# Patient Record
Sex: Female | Born: 1990 | Race: Black or African American | Hispanic: No | Marital: Single | Smoking: Never smoker
Health system: Southern US, Community
[De-identification: ages and names within clinical notes are randomized; demographics above are authoritative.]

## PROBLEM LIST (undated history)

## (undated) ENCOUNTER — Inpatient Hospital Stay (HOSPITAL_COMMUNITY): Payer: Self-pay

## (undated) DIAGNOSIS — I5020 Unspecified systolic (congestive) heart failure: Secondary | ICD-10-CM

## (undated) DIAGNOSIS — I10 Essential (primary) hypertension: Secondary | ICD-10-CM

## (undated) HISTORY — DX: Unspecified systolic (congestive) heart failure: I50.20

---
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
## 2011-02-20 LAB — CBC
MCHC: 34.9
MCV: 92.5
Platelets: 243
Platelets: 276
RDW: 13.8
WBC: 12.8 — ABNORMAL HIGH
WBC: 18.6 — ABNORMAL HIGH

## 2011-02-20 LAB — RPR: RPR Ser Ql: NONREACTIVE

## 2013-05-22 DIAGNOSIS — I10 Essential (primary) hypertension: Secondary | ICD-10-CM

## 2013-05-22 HISTORY — DX: Essential (primary) hypertension: I10

## 2013-08-29 DIAGNOSIS — Z87410 Personal history of cervical dysplasia: Secondary | ICD-10-CM | POA: Insufficient documentation

## 2015-02-20 HISTORY — PX: CHOLECYSTECTOMY: SHX55

## 2016-02-11 ENCOUNTER — Ambulatory Visit (HOSPITAL_COMMUNITY)
Admission: EM | Admit: 2016-02-11 | Discharge: 2016-02-11 | Disposition: A | Discharge status: Home / Self Care | Payer: Self-pay | Attending: Internal Medicine | Admitting: Internal Medicine

## 2016-02-11 ENCOUNTER — Encounter (HOSPITAL_COMMUNITY): Payer: Self-pay | Admitting: *Deleted

## 2016-02-11 DIAGNOSIS — K029 Dental caries, unspecified: Secondary | ICD-10-CM

## 2016-02-11 HISTORY — DX: Essential (primary) hypertension: I10

## 2016-02-11 MED ORDER — METHYLPREDNISOLONE ACETATE 80 MG/ML IJ SUSP: 80.0000 mg | Freq: Once | INTRAMUSCULAR | Status: DC

## 2016-02-11 MED ORDER — AMOXICILLIN 500 MG PO CAPS: 500.0000 mg | ORAL_CAPSULE | Freq: Three times a day (TID) | ORAL | 0 refills | Status: DC

## 2016-02-11 MED ORDER — DICLOFENAC SODIUM 75 MG PO TBEC: DELAYED_RELEASE_TABLET | ORAL | 1 refills | Status: DC

## 2016-02-11 NOTE — ED Triage Notes (Signed)
Pt  Reports  Toothache     She  Reports  It is  An  Ongoing  Problem        And    She  Treatment     Pt  Reports       Symptoms  Not  releived  By otc meds

## 2016-02-11 NOTE — ED Provider Notes (Signed)
CSN: [id number]     Arrival date & time 02/11/16  1809 History   First MD Initiated Contact with Patient 02/11/16 1919     No chief complaint on file.  (Consider location/radiation/quality/duration/timing/severity/associated sxs/prior Treatment) 25 yo female presents with pain and "broken tooth" to right lower gum. Known need for dentist but does not have dental benefits yet. She will have soon. No fever or chills. Swelling and pain along the gum line.       No past medical history on file. No past surgical history on file. No family history on file. Social History  Substance Use Topics  . Smoking status: Not on file  . Smokeless tobacco: Not on file  . Alcohol use Not on file   OB History    No data available     Review of Systems  Constitutional: Negative for chills and fever.  HENT: Positive for dental problem.     Allergies  Review of patient's allergies indicates not on file.  Home Medications   Prior to Admission medications   Medication Sig Start Date End Date Taking? Authorizing Provider  amoxicillin (AMOXIL) 500 MG capsule Take 1 capsule (500 mg total) by mouth 3 (three) times daily. 02/11/16   Riki SheerMichelle G Sumiye Hirth, PA-C  diclofenac (VOLTAREN) 75 MG EC tablet 1 every 12 hours as needed for pain 02/11/16   Riki SheerMichelle G Eshaal Duby, PA-C   Meds Ordered and Administered this Visit  Medications - No data to display  BP 158/92 (BP Location: Left Arm)   Pulse 70   Temp 98.9 F (37.2 C) (Oral)   Resp 12   SpO2 100%  No data found.   Physical Exam  Constitutional: She appears well-developed and well-nourished. No distress.  HENT:  Mouth/Throat: Oropharynx is clear and moist.  Right lower gum with broken tooth, erythematous gum, pain with palpation, no visible frank abscess or drainage  Skin: Skin is warm and dry. She is not diaphoretic.  Psychiatric: Her behavior is normal.  Nursing note and vitals reviewed.   Urgent Care Course   Clinical Course    Procedures  (including critical care time)  Labs Review Labs Reviewed - No data to display  Imaging Review No results found.   Visual Acuity Review  Right Eye Distance:   Left Eye Distance:   Bilateral Distance:    Right Eye Near:   Left Eye Near:    Bilateral Near:         MDM   1. Pain due to dental caries    No frank abscess. Treat with Amoxicillin and NSAID. Try to f/u with Dentist. If worsens or has signs of systemic infection f/u in the ER.     Riki SheerMichelle G Nikeshia Keetch, PA-C 02/11/16 1932

## 2016-02-11 NOTE — Discharge Instructions (Signed)
Take all of the antibiotics. Use the Diclofenac every 12 hours for pain, may supplement with Tylenol ES 2 every 4 hours as well. Feel better. Get to a dentist when can. If worsens or start having high fevers or swelling, go to ED.

## 2016-02-25 ENCOUNTER — Ambulatory Visit (INDEPENDENT_AMBULATORY_CARE_PROVIDER_SITE_OTHER): Payer: Self-pay | Admitting: Internal Medicine

## 2016-02-25 ENCOUNTER — Encounter: Payer: Self-pay | Admitting: Internal Medicine

## 2016-02-25 VITALS — BP 160/104 | Ht 67.0 in | Wt 207.0 lb

## 2016-02-25 DIAGNOSIS — K029 Dental caries, unspecified: Secondary | ICD-10-CM

## 2016-02-25 DIAGNOSIS — I10 Essential (primary) hypertension: Secondary | ICD-10-CM | POA: Insufficient documentation

## 2016-02-25 MED ORDER — AMLODIPINE BESYLATE 5 MG PO TABS: 5.0000 mg | ORAL_TABLET | Freq: Every day | ORAL | 11 refills | Status: DC

## 2016-02-25 NOTE — Progress Notes (Signed)
   Subjective:    Patient ID: Ann Robbins, female    DOB: 10/30/1990, 25 y.o.   MRN: [id number]  HPI   Here to establish  1.  Essential Hypertension:  Diagnosed with PIH at age 25 yo.  After delivered, BP stayed high.  Was treated with Amlodipine 5 mg.    She is on Micronor BCPs and takes regularly, though having some break through bleeding since on antibiotics last month.  Discussed using condoms for the pill pack when on antibiotics in the future. No plans for anymore children.  Current Meds  Medication Sig  . norethindrone (MICRONOR,CAMILA,ERRIN) 0.35 MG tablet Take 1 tablet by mouth daily.   No Known Allergies   Past Medical History:  Diagnosis Date  . Hypertension 2015   First with PIH and bp remained high after delivery   Past Surgical History:  Procedure Laterality Date  . CHOLECYSTECTOMY  02/2015   Laparoscopic   Family History  Problem Relation Age of Onset  . Hypertension Mother    Social History   Social History  . Marital status: Single    Spouse name: Nicanor AlconHugh McAdoo  . Number of children: 2  . Years of education: college-some   Occupational History  . [employer]    Social History Main Topics  . Smoking status: Never Smoker  . Smokeless tobacco: Never Used  . Alcohol use No  . Drug use: No  . Sexual activity: Yes    Birth control/ protection: Pill   Other Topics Concern  . Not on file   Social History Narrative   [place]   [place] in March of 2017   Lives at home with boyfriend, Jena GaussHugh and their 2 children        Review of Systems     Objective:   Physical Exam  NAD HEENT: PERRL, EOMI, discs sharp, no AV nicking, TMs pearly gray, throat without injection.  caried teeth. Neck: Supple, no adenopathy, no thyromegaly Chest:  CTA CV:  RRR with normal S1 and S2, No S3, S4 or murmur.  Radial and DP pulses normal and equal Abd:  S, NT, No HSM or mass, + BS LE:  No edema        Assessment &  Plan:  1.  Essential Hypertension:  Amlodipine 5 mg daily.  Follow up next Wednesday for BP check so I can fill out form for work that her BP is controlled.  BMP then as well. [facility], but arrange for delivery thereafter.  2.  Dental decay:  Dental referral.  Will need orange card to send in.  Follow up in 2-3 months with me

## 2016-02-26 DIAGNOSIS — K029 Dental caries, unspecified: Secondary | ICD-10-CM | POA: Insufficient documentation

## 2016-03-01 ENCOUNTER — Ambulatory Visit (INDEPENDENT_AMBULATORY_CARE_PROVIDER_SITE_OTHER): Payer: Self-pay | Admitting: Internal Medicine

## 2016-03-01 VITALS — BP 150/108 | HR 99

## 2016-03-01 DIAGNOSIS — I10 Essential (primary) hypertension: Secondary | ICD-10-CM

## 2016-03-01 MED ORDER — AMLODIPINE BESYLATE 10 MG PO TABS: ORAL_TABLET | ORAL | 11 refills | Status: DC

## 2016-03-01 NOTE — Progress Notes (Signed)
Here for nurse BP check.   Measurements by Richrd SoxEstefania Alfaro Ruiz  Increase Amlodipine to 10 mg daily Repeat BP check in 1 week.

## 2016-03-09 ENCOUNTER — Ambulatory Visit: Payer: Self-pay

## 2016-03-09 VITALS — BP 146/94

## 2016-03-09 DIAGNOSIS — I10 Essential (primary) hypertension: Secondary | ICD-10-CM

## 2016-03-13 NOTE — Progress Notes (Signed)
Patient in for BP CHECK

## 2016-03-27 ENCOUNTER — Ambulatory Visit: Payer: Self-pay

## 2016-03-27 VITALS — BP 130/90 | HR 96

## 2016-03-27 DIAGNOSIS — I10 Essential (primary) hypertension: Secondary | ICD-10-CM

## 2016-03-28 ENCOUNTER — Encounter: Payer: Self-pay | Admitting: Internal Medicine

## 2016-03-28 ENCOUNTER — Ambulatory Visit (INDEPENDENT_AMBULATORY_CARE_PROVIDER_SITE_OTHER): Payer: Self-pay | Admitting: Internal Medicine

## 2016-03-28 VITALS — BP 134/84 | HR 98 | Resp 18 | Ht 67.0 in | Wt 210.0 lb

## 2016-03-28 DIAGNOSIS — I10 Essential (primary) hypertension: Secondary | ICD-10-CM

## 2016-03-28 NOTE — Progress Notes (Signed)
   Subjective:    Patient ID: Ann Robbins, female    DOB: 03/19/1991, 25 y.o.   MRN: [id number]  HPI   Here as needs form for work signed off regarding having a normal bp.  Yesterday, her bp was above goal of 140/90.  Today, she meets the goal. No problems with Amlodipine. Discussed again that would recommend a switch of bp meds if she decides to get pregnant   Would need to continue on some sort of reliable birth control.  Current Meds  Medication Sig  . amLODipine (NORVASC) 10 MG tablet 1 tab by mouth once daily  . norethindrone (MICRONOR,CAMILA,ERRIN) 0.35 MG tablet Take 1 tablet by mouth daily.    No Known Allergies    Review of Systems     Objective:   Physical Exam NAD Lungs:  CTA CV:  RRR without murmur or rub, radial pulses normal and equal       Assessment & Plan:  Essential Hypertension:  At goal below 140/90, but discussed regular physical activity to get a bit lower. Keep appt. In January

## 2016-05-22 NOTE — L&D Delivery Note (Signed)
Patient is a 26 y.o. now [id number] s/p NSVD at 7054w1d, who [facility]. S/p IOL with misoprostol and Oxytocin. AROM with clear fluid at 16:45 on 12/30.  Delivery Note At 12:35 AM a viable female was delivered via Vaginal, Spontaneous (Presentation: vertex; LOA ).  APGAR: 9, 9; weight pending. Placenta status: intact, .  Cord: 3-vessel Anesthesia:   Episiotomy: None Lacerations: Periurethral Suture Repair: none Est. Blood Loss (mL): 150  Head delivered LOA. Nuchal cord x1 present, loose and easily reduced, then shoulders and body delivered in usual fashion. Infant with spontaneous cry, placed on mother's abdomen, dried and bulb suctioned. Cord clamped x 2 after 1-minute delay, and cut by family member. Cord blood drawn. Placenta delivered spontaneously with gentle cord traction. Fundus firm with massage and Pitocin. Perineum inspected and found to have no lacerations other than superficial left periurethral abrasion.   Mom to postpartum.  Baby to Couplet care / Skin to Skin.  Raynelle FanningJulie P. Marin Milley, MD OB Fellow 05/21/17, 1:06 AM

## 2016-05-26 ENCOUNTER — Ambulatory Visit: Payer: Self-pay | Admitting: Internal Medicine

## 2016-07-07 ENCOUNTER — Ambulatory Visit (INDEPENDENT_AMBULATORY_CARE_PROVIDER_SITE_OTHER): Payer: Self-pay | Admitting: Internal Medicine

## 2016-07-07 ENCOUNTER — Ambulatory Visit: Payer: Self-pay | Admitting: Internal Medicine

## 2016-07-07 ENCOUNTER — Encounter: Payer: Self-pay | Admitting: Internal Medicine

## 2016-07-07 VITALS — BP 144/94 | HR 78 | Resp 12 | Ht 66.5 in | Wt 218.0 lb

## 2016-07-07 DIAGNOSIS — E6609 Other obesity due to excess calories: Secondary | ICD-10-CM

## 2016-07-07 DIAGNOSIS — I10 Essential (primary) hypertension: Secondary | ICD-10-CM

## 2016-07-07 DIAGNOSIS — Z6834 Body mass index (BMI) 34.0-34.9, adult: Secondary | ICD-10-CM

## 2016-07-07 NOTE — Progress Notes (Signed)
   Subjective:    Patient ID: Ann Robbins, female    DOB: 06/12/1990, 26 y.o.   MRN: [id number]  HPI   Essential Hypertension:  States has not missed Amlodipine.  Feels she had a bad day at work and that's why her bp may be high.  Has gained 8 lbs since last visit.   Describes an unhealthy diet. Not really physically active Discussed how weight and sedentary lifestyle, poor diet can maintain hypertension.  Current Meds  Medication Sig  . amLODipine (NORVASC) 10 MG tablet 1 tab by mouth once daily   No Known Allergies    Review of Systems     Objective:   Physical Exam Obese, NAD Lungs:  CTA CV:  RRR with normal S1 and S2, No S3, S4 or murmur, radial pulses normal and equal. LE:  No edema        Assessment & Plan:  1.  Essential Hypertension:  Not at goal.  Encouraged working on gradually improving lifestyle changes with diet and physical activity to improve BP.  If unable to make a change soon, will likely need to add medication.   BP check and weight check in 1 month and monthly.  To see me in 3 months

## 2016-07-07 NOTE — Patient Instructions (Signed)
Drink a glass of water before every meal Drink 6-8 glasses of water daily Eat three meals daily Eat a protein and healthy fat with every meal (eggs,fish, chicken, turkey and limit red meats) Eat 5 servings of vegetables daily, mix the colors Eat 2 servings of fruit daily with skin, if skin is edible Use smaller plates Put food/utensils down as you chew and swallow each bite Eat at a table with friends/family at least once daily, no TV Do not eat in front of the TV 

## 2016-07-09 ENCOUNTER — Encounter: Payer: Self-pay | Admitting: Internal Medicine

## 2016-07-09 DIAGNOSIS — E669 Obesity, unspecified: Secondary | ICD-10-CM | POA: Insufficient documentation

## 2016-08-04 ENCOUNTER — Other Ambulatory Visit: Payer: Self-pay | Admitting: Internal Medicine

## 2016-10-03 ENCOUNTER — Ambulatory Visit: Payer: Self-pay | Admitting: Internal Medicine

## 2016-11-09 LAB — SICKLE CELL SCREEN: SICKLE CELL SCREEN: NORMAL

## 2016-11-09 LAB — OB RESULTS CONSOLE RUBELLA ANTIBODY, IGM: RUBELLA: IMMUNE

## 2016-11-09 LAB — OB RESULTS CONSOLE HIV ANTIBODY (ROUTINE TESTING): HIV: NONREACTIVE

## 2016-11-09 LAB — OB RESULTS CONSOLE HEPATITIS B SURFACE ANTIGEN: HEP B S AG: NEGATIVE

## 2016-11-10 ENCOUNTER — Ambulatory Visit: Payer: Self-pay | Admitting: Internal Medicine

## 2016-11-13 ENCOUNTER — Other Ambulatory Visit (HOSPITAL_COMMUNITY): Payer: Self-pay | Admitting: Nurse Practitioner

## 2016-11-13 DIAGNOSIS — Z3682 Encounter for antenatal screening for nuchal translucency: Secondary | ICD-10-CM

## 2016-11-13 LAB — OB RESULTS CONSOLE GC/CHLAMYDIA
CHLAMYDIA, DNA PROBE: NEGATIVE
GC PROBE AMP, GENITAL: NEGATIVE

## 2016-11-13 LAB — OB RESULTS CONSOLE ANTIBODY SCREEN: Antibody Screen: NEGATIVE

## 2016-11-13 LAB — OB RESULTS CONSOLE HGB/HCT, BLOOD
HCT: 33
Hemoglobin: 11

## 2016-11-13 LAB — OB RESULTS CONSOLE PLATELET COUNT: Platelets: 299

## 2016-11-13 LAB — OB RESULTS CONSOLE ABO/RH: RH TYPE: POSITIVE

## 2016-11-13 LAB — OB RESULTS CONSOLE RPR: RPR: NONREACTIVE

## 2016-11-13 LAB — OB RESULTS CONSOLE VARICELLA ZOSTER ANTIBODY, IGG: Varicella: IMMUNE

## 2016-11-23 ENCOUNTER — Encounter (HOSPITAL_COMMUNITY): Payer: Self-pay | Admitting: *Deleted

## 2016-11-24 ENCOUNTER — Ambulatory Visit (HOSPITAL_COMMUNITY)
Admission: RE | Admit: 2016-11-24 | Discharge: 2016-11-24 | Disposition: A | Discharge status: Home / Self Care | Payer: Medicaid Other | Source: Ambulatory Visit | Attending: Nurse Practitioner | Admitting: Nurse Practitioner

## 2016-11-24 ENCOUNTER — Encounter (HOSPITAL_COMMUNITY): Payer: Self-pay

## 2016-11-24 DIAGNOSIS — Z3682 Encounter for antenatal screening for nuchal translucency: Secondary | ICD-10-CM | POA: Diagnosis not present

## 2016-11-24 DIAGNOSIS — Z3A13 13 weeks gestation of pregnancy: Secondary | ICD-10-CM | POA: Insufficient documentation

## 2016-11-24 DIAGNOSIS — O99211 Obesity complicating pregnancy, first trimester: Secondary | ICD-10-CM | POA: Diagnosis not present

## 2016-11-24 DIAGNOSIS — O10019 Pre-existing essential hypertension complicating pregnancy, unspecified trimester: Secondary | ICD-10-CM | POA: Insufficient documentation

## 2016-11-30 ENCOUNTER — Encounter: Payer: Self-pay | Admitting: *Deleted

## 2016-12-04 ENCOUNTER — Encounter: Payer: Self-pay | Admitting: General Practice

## 2016-12-04 ENCOUNTER — Encounter: Payer: Self-pay | Admitting: Obstetrics and Gynecology

## 2016-12-04 ENCOUNTER — Ambulatory Visit (INDEPENDENT_AMBULATORY_CARE_PROVIDER_SITE_OTHER): Payer: Medicaid Other | Admitting: Obstetrics and Gynecology

## 2016-12-04 VITALS — BP 128/81 | HR 98 | Wt 214.5 lb

## 2016-12-04 DIAGNOSIS — O0992 Supervision of high risk pregnancy, unspecified, second trimester: Secondary | ICD-10-CM

## 2016-12-04 DIAGNOSIS — O10912 Unspecified pre-existing hypertension complicating pregnancy, second trimester: Secondary | ICD-10-CM

## 2016-12-04 DIAGNOSIS — O0990 Supervision of high risk pregnancy, unspecified, unspecified trimester: Secondary | ICD-10-CM | POA: Insufficient documentation

## 2016-12-04 DIAGNOSIS — Z3009 Encounter for other general counseling and advice on contraception: Secondary | ICD-10-CM

## 2016-12-04 DIAGNOSIS — O10919 Unspecified pre-existing hypertension complicating pregnancy, unspecified trimester: Secondary | ICD-10-CM | POA: Insufficient documentation

## 2016-12-04 LAB — POCT URINALYSIS DIP (DEVICE)
Bilirubin Urine: NEGATIVE
Glucose, UA: NEGATIVE mg/dL
HGB URINE DIPSTICK: NEGATIVE
Ketones, ur: NEGATIVE mg/dL
LEUKOCYTES UA: NEGATIVE
NITRITE: NEGATIVE
Protein, ur: NEGATIVE mg/dL
SPECIFIC GRAVITY, URINE: 1.01 (ref 1.005–1.030)
Urobilinogen, UA: 0.2 mg/dL (ref 0.0–1.0)
pH: 5.5 (ref 5.0–8.0)

## 2016-12-04 MED ORDER — ASPIRIN EC 81 MG PO TBEC: 81.0000 mg | DELAYED_RELEASE_TABLET | Freq: Every day | ORAL | 3 refills | Status: DC

## 2016-12-04 NOTE — Patient Instructions (Signed)

## 2016-12-04 NOTE — Progress Notes (Signed)
Subjective:  Ann Robbins is a 26 y.o. G3P2002 at 6527w1d being seen today for ongoing prenatal care. [facility].  She is currently monitored for the following issues for this high-risk pregnancy and has Essential hypertension; Dental decay; Obesity; Supervision of high risk pregnancy, antepartum; Unwanted fertility; and Chronic hypertension during pregnancy, antepartum on her problem list.  Patient reports no complaints.  Contractions: Not present. Vag. Bleeding: None.  Movement: Absent. Denies leaking of fluid.   The following portions of the patient's history were reviewed and updated as appropriate: allergies, current medications, past family history, past medical history, past social history, past surgical history and problem list. Problem list updated.  Objective:   Vitals:   12/04/16 1034  BP: 128/81  Pulse: 98  Weight: 214 lb 8 oz (97.3 kg)    Fetal Status: Fetal Heart Rate (bpm): 163   Movement: Absent     General:  Alert, oriented and cooperative. Patient is in no acute distress.  Skin: Skin is warm and dry. No rash noted.   Cardiovascular: Normal heart rate noted  Respiratory: Normal respiratory effort, no problems with respiration noted  Abdomen: Soft, gravid, appropriate for gestational age. Pain/Pressure: Absent     Pelvic:  Cervical exam deferred        Extremities: Normal range of motion.  Edema: None  Mental Status: Normal mood and affect. Normal behavior. Normal judgment and thought content.   Urinalysis:      Assessment and Plan:  Pregnancy: G3P2002 at 3227w1d  1. Chronic hypertension during pregnancy, antepartum BP stable Continue with Labetalol. CHTN and pregnancy reviewed with pt.  CMP and P/Cr completed - aspirin EC 81 MG tablet; Take 1 tablet (81 mg total) by mouth daily. Take after 12 weeks for prevention of preeclampsia later in pregnancy  Dispense: 90 tablet; Refill: 3 - US MFM OB DETAIL +14 WK; Future  2. Supervision of high risk  pregnancy, antepartum  - US MFM OB DETAIL +14 WK; Future  3. Unwanted fertility Will need to sign tubal papers at 28 week visit  Preterm labor symptoms and general obstetric precautions including but not limited to vaginal bleeding, contractions, leaking of fluid and fetal movement were reviewed in detail with the patient. Please refer to After Visit Summary for other counseling recommendations.  Return in about 4 weeks (around 01/01/2017) for OB visit.   Hermina StaggersErvin, Michael L, MD

## 2016-12-05 ENCOUNTER — Other Ambulatory Visit: Payer: Self-pay

## 2016-12-06 LAB — PAP SMEAR, 1 SLIDE: Pap: NEGATIVE

## 2016-12-06 LAB — CULTURE, OB URINE: Urine Culture, OB: NO GROWTH

## 2016-12-25 ENCOUNTER — Ambulatory Visit (HOSPITAL_COMMUNITY)
Admission: RE | Admit: 2016-12-25 | Discharge: 2016-12-25 | Disposition: A | Discharge status: Home / Self Care | Payer: Medicaid Other | Source: Ambulatory Visit | Attending: Obstetrics and Gynecology | Admitting: Obstetrics and Gynecology

## 2016-12-25 ENCOUNTER — Encounter (HOSPITAL_COMMUNITY): Payer: Self-pay

## 2016-12-25 DIAGNOSIS — O10019 Pre-existing essential hypertension complicating pregnancy, unspecified trimester: Secondary | ICD-10-CM | POA: Diagnosis not present

## 2016-12-25 DIAGNOSIS — O99211 Obesity complicating pregnancy, first trimester: Secondary | ICD-10-CM | POA: Diagnosis not present

## 2016-12-25 DIAGNOSIS — Z3A18 18 weeks gestation of pregnancy: Secondary | ICD-10-CM | POA: Diagnosis not present

## 2016-12-25 DIAGNOSIS — O0990 Supervision of high risk pregnancy, unspecified, unspecified trimester: Secondary | ICD-10-CM

## 2016-12-25 DIAGNOSIS — O10919 Unspecified pre-existing hypertension complicating pregnancy, unspecified trimester: Secondary | ICD-10-CM

## 2016-12-26 ENCOUNTER — Other Ambulatory Visit (HOSPITAL_COMMUNITY): Payer: Self-pay | Admitting: *Deleted

## 2016-12-26 DIAGNOSIS — O10919 Unspecified pre-existing hypertension complicating pregnancy, unspecified trimester: Secondary | ICD-10-CM

## 2016-12-28 ENCOUNTER — Telehealth: Payer: Self-pay | Admitting: *Deleted

## 2016-12-28 DIAGNOSIS — Z3009 Encounter for other general counseling and advice on contraception: Secondary | ICD-10-CM

## 2016-12-28 DIAGNOSIS — O0990 Supervision of high risk pregnancy, unspecified, unspecified trimester: Secondary | ICD-10-CM

## 2016-12-28 NOTE — Telephone Encounter (Signed)
-----   Message from Hermina StaggersMichael L Ervin, MD sent at 12/26/2016  9:42 AM EDT ----- Please schedule pt for growth U/S q weeks until 40 weeks Starting from last U/S  Thanks Casimiro NeedleMichael

## 2016-12-28 NOTE — Telephone Encounter (Signed)
Scheduled next 3 ultrasounds ( had one scheduled) . Called and notified IsraelJaerika.

## 2017-01-01 ENCOUNTER — Ambulatory Visit (INDEPENDENT_AMBULATORY_CARE_PROVIDER_SITE_OTHER): Payer: Medicaid Other | Admitting: Obstetrics & Gynecology

## 2017-01-01 VITALS — BP 135/83 | HR 88 | Wt 214.4 lb

## 2017-01-01 DIAGNOSIS — O10919 Unspecified pre-existing hypertension complicating pregnancy, unspecified trimester: Secondary | ICD-10-CM

## 2017-01-01 DIAGNOSIS — O10912 Unspecified pre-existing hypertension complicating pregnancy, second trimester: Secondary | ICD-10-CM

## 2017-01-01 DIAGNOSIS — Z87410 Personal history of cervical dysplasia: Secondary | ICD-10-CM

## 2017-01-01 DIAGNOSIS — O0990 Supervision of high risk pregnancy, unspecified, unspecified trimester: Secondary | ICD-10-CM

## 2017-01-01 DIAGNOSIS — O0992 Supervision of high risk pregnancy, unspecified, second trimester: Secondary | ICD-10-CM

## 2017-01-01 LAB — POCT URINALYSIS DIP (DEVICE)
GLUCOSE, UA: NEGATIVE mg/dL
Hgb urine dipstick: NEGATIVE
Ketones, ur: NEGATIVE mg/dL
LEUKOCYTES UA: NEGATIVE
NITRITE: NEGATIVE
Protein, ur: 30 mg/dL — AB
Specific Gravity, Urine: 1.025 (ref 1.005–1.030)
UROBILINOGEN UA: 0.2 mg/dL (ref 0.0–1.0)
pH: 6.5 (ref 5.0–8.0)

## 2017-01-01 MED ORDER — ASPIRIN EC 81 MG PO TBEC: 81.0000 mg | DELAYED_RELEASE_TABLET | Freq: Every day | ORAL | 3 refills | Status: DC

## 2017-01-01 NOTE — Progress Notes (Signed)
   PRENATAL VISIT NOTE  Subjective:  Ann Robbins is a 26 y.o. G3P2002 at 1618w1d being seen today for ongoing prenatal care.  She is currently monitored for the following issues for this high-risk pregnancy and has Essential hypertension; Dental decay; Obesity; Supervision of high risk pregnancy, antepartum; Unwanted fertility; Chronic hypertension during pregnancy, antepartum; and HGSIL (high grade squamous intraepithelial dysplasia) on her problem list.  Patient reports no complaints.  Contractions: Not present. Vag. Bleeding: None.  Movement: Present. Denies leaking of fluid.   The following portions of the patient's history were reviewed and updated as appropriate: allergies, current medications, past family history, past medical history, past social history, past surgical history and problem list. Problem list updated.  Objective:   Vitals:   01/01/17 1252  BP: 135/83  Pulse: 88  Weight: 214 lb 6.4 oz (97.3 kg)    Fetal Status: Fetal Heart Rate (bpm): 160   Movement: Present     General:  Alert, oriented and cooperative. Patient is in no acute distress.  Skin: Skin is warm and dry. No rash noted.   Cardiovascular: Normal heart rate noted  Respiratory: Normal respiratory effort, no problems with respiration noted  Abdomen: Soft, gravid, appropriate for gestational age.  Pain/Pressure: Absent     Pelvic: Cervical exam deferred        Extremities: Normal range of motion.  Edema: None  Mental Status:  Normal mood and affect. Normal behavior. Normal judgment and thought content.   Assessment and Plan:  Pregnancy: G3P2002 at 1718w1d  1. Supervision of high risk pregnancy, antepartum AFP today US scheduled at next visit Follow up in 3 weeks  2. Chronic hypertension during pregnancy, antepartum BP nml Will start ASA today   Preterm labor symptoms and general obstetric precautions including but not limited to vaginal bleeding, contractions, leaking of fluid and fetal movement  were reviewed in detail with the patient. Please refer to After Visit Summary for other counseling recommendations.  Return in about 3 weeks (around 01/22/2017).   Elsie LincolnKelly Rudi Bunyard, MD

## 2017-01-04 LAB — AFP, SERUM, OPEN SPINA BIFIDA
AFP MOM: 1.73
AFP Value: 75.8 ng/mL
Gest. Age on Collection Date: 19.1 weeks
Maternal Age At EDD: 27.6 yr
OSBR Risk 1 IN: 3028
TEST RESULTS AFP: NEGATIVE
Weight: 214 [lb_av]

## 2017-01-05 ENCOUNTER — Other Ambulatory Visit: Payer: Self-pay

## 2017-01-24 ENCOUNTER — Encounter (HOSPITAL_COMMUNITY): Payer: Self-pay

## 2017-01-24 ENCOUNTER — Other Ambulatory Visit (HOSPITAL_COMMUNITY): Payer: Self-pay | Admitting: Obstetrics and Gynecology

## 2017-01-24 ENCOUNTER — Ambulatory Visit (HOSPITAL_COMMUNITY)
Admission: RE | Admit: 2017-01-24 | Discharge: 2017-01-24 | Disposition: A | Discharge status: Home / Self Care | Payer: Medicaid Other | Source: Ambulatory Visit | Attending: Internal Medicine | Admitting: Internal Medicine

## 2017-01-24 DIAGNOSIS — Z3A22 22 weeks gestation of pregnancy: Secondary | ICD-10-CM | POA: Diagnosis not present

## 2017-01-24 DIAGNOSIS — O10919 Unspecified pre-existing hypertension complicating pregnancy, unspecified trimester: Secondary | ICD-10-CM | POA: Diagnosis present

## 2017-01-24 DIAGNOSIS — Z6835 Body mass index (BMI) 35.0-35.9, adult: Secondary | ICD-10-CM | POA: Insufficient documentation

## 2017-01-24 DIAGNOSIS — O10012 Pre-existing essential hypertension complicating pregnancy, second trimester: Secondary | ICD-10-CM | POA: Insufficient documentation

## 2017-01-24 DIAGNOSIS — O99212 Obesity complicating pregnancy, second trimester: Secondary | ICD-10-CM | POA: Diagnosis not present

## 2017-01-25 ENCOUNTER — Encounter: Payer: Medicaid Other | Admitting: Obstetrics & Gynecology

## 2017-01-25 ENCOUNTER — Ambulatory Visit (INDEPENDENT_AMBULATORY_CARE_PROVIDER_SITE_OTHER): Payer: Medicaid Other | Admitting: Family Medicine

## 2017-01-25 VITALS — BP 118/75 | HR 110 | Wt 221.3 lb

## 2017-01-25 DIAGNOSIS — Z6834 Body mass index (BMI) 34.0-34.9, adult: Secondary | ICD-10-CM

## 2017-01-25 DIAGNOSIS — O0992 Supervision of high risk pregnancy, unspecified, second trimester: Secondary | ICD-10-CM

## 2017-01-25 DIAGNOSIS — O10919 Unspecified pre-existing hypertension complicating pregnancy, unspecified trimester: Secondary | ICD-10-CM

## 2017-01-25 DIAGNOSIS — O0990 Supervision of high risk pregnancy, unspecified, unspecified trimester: Secondary | ICD-10-CM

## 2017-01-25 DIAGNOSIS — E6609 Other obesity due to excess calories: Secondary | ICD-10-CM

## 2017-01-25 DIAGNOSIS — O10912 Unspecified pre-existing hypertension complicating pregnancy, second trimester: Secondary | ICD-10-CM

## 2017-01-25 LAB — POCT URINALYSIS DIP (DEVICE)
GLUCOSE, UA: NEGATIVE mg/dL
Hgb urine dipstick: NEGATIVE
NITRITE: NEGATIVE
Protein, ur: 30 mg/dL — AB
Specific Gravity, Urine: >=1.03 (ref 1.005–1.030)
Urobilinogen, UA: 0.2 mg/dL (ref 0.0–1.0)
pH: 6.5 (ref 5.0–8.0)

## 2017-01-25 NOTE — Progress Notes (Signed)
   PRENATAL VISIT NOTE  Subjective:  Ann Robbins is a 26 y.o. G3P2002 at 5371w4d being seen today for ongoing prenatal care.  She is currently monitored for the following issues for this high-risk pregnancy and has Essential hypertension; Dental decay; Obesity; Supervision of high risk pregnancy, antepartum; Unwanted fertility; Chronic hypertension during pregnancy, antepartum; and History of cervical dysplasia on her problem list.  Patient reports no complaints.  Contractions: Not present. Vag. Bleeding: None.  Movement: Present. Denies leaking of fluid.   The following portions of the patient's history were reviewed and updated as appropriate: allergies, current medications, past family history, past medical history, past social history, past surgical history and problem list. Problem list updated.  Objective:   Vitals:   01/25/17 1514  BP: 118/75  Pulse: (!) 110  Weight: 221 lb 4.8 oz (100.4 kg)    Fetal Status: Fetal Heart Rate (bpm): 159   Movement: Present     General:  Alert, oriented and cooperative. Patient is in no acute distress.  Skin: Skin is warm and dry. No rash noted.   Cardiovascular: Normal heart rate noted  Respiratory: Normal respiratory effort, no problems with respiration noted  Abdomen: Soft, gravid, appropriate for gestational age.  Pain/Pressure: Absent     Pelvic: Cervical exam deferred        Extremities: Normal range of motion.  Edema: None  Mental Status:  Normal mood and affect. Normal behavior. Normal judgment and thought content.   Assessment and Plan:  Pregnancy: G3P2002 at 9271w4d  1. Supervision of high risk pregnancy, antepartum FHT and FH normal  2. Chronic hypertension during pregnancy, antepartum BP normal. Growth US yesterday normal. Continue US in 4 weeks  3. Class 1 obesity due to excess calories with serious comorbidity and body mass index (BMI) of 34.0 to 34.9 in adult   Preterm labor symptoms and general obstetric precautions  including but not limited to vaginal bleeding, contractions, leaking of fluid and fetal movement were reviewed in detail with the patient. Please refer to After Visit Summary for other counseling recommendations.  No Follow-up on file.   Levie HeritageJacob J Lawyer Washabaugh, DO

## 2017-02-19 ENCOUNTER — Other Ambulatory Visit: Payer: Self-pay | Admitting: *Deleted

## 2017-02-19 MED ORDER — PRENATAL PLUS 27-1 MG PO TABS: ORAL_TABLET | ORAL | 12 refills | Status: DC

## 2017-02-21 ENCOUNTER — Other Ambulatory Visit: Payer: Self-pay | Admitting: Obstetrics and Gynecology

## 2017-02-21 ENCOUNTER — Ambulatory Visit (HOSPITAL_COMMUNITY): Payer: Medicaid Other

## 2017-02-21 ENCOUNTER — Ambulatory Visit (HOSPITAL_COMMUNITY)
Admission: RE | Admit: 2017-02-21 | Discharge: 2017-02-21 | Disposition: A | Discharge status: Home / Self Care | Payer: Medicaid Other | Source: Ambulatory Visit | Attending: Obstetrics and Gynecology | Admitting: Obstetrics and Gynecology

## 2017-02-21 DIAGNOSIS — O10012 Pre-existing essential hypertension complicating pregnancy, second trimester: Secondary | ICD-10-CM | POA: Diagnosis not present

## 2017-02-21 DIAGNOSIS — Z3009 Encounter for other general counseling and advice on contraception: Secondary | ICD-10-CM

## 2017-02-21 DIAGNOSIS — O99212 Obesity complicating pregnancy, second trimester: Secondary | ICD-10-CM | POA: Diagnosis present

## 2017-02-21 DIAGNOSIS — O0990 Supervision of high risk pregnancy, unspecified, unspecified trimester: Secondary | ICD-10-CM

## 2017-02-21 DIAGNOSIS — Z3A26 26 weeks gestation of pregnancy: Secondary | ICD-10-CM | POA: Diagnosis not present

## 2017-02-22 ENCOUNTER — Ambulatory Visit (INDEPENDENT_AMBULATORY_CARE_PROVIDER_SITE_OTHER): Payer: Medicaid Other | Admitting: Obstetrics & Gynecology

## 2017-02-22 VITALS — BP 129/72 | HR 101 | Wt 221.6 lb

## 2017-02-22 DIAGNOSIS — O10919 Unspecified pre-existing hypertension complicating pregnancy, unspecified trimester: Secondary | ICD-10-CM

## 2017-02-22 DIAGNOSIS — Z23 Encounter for immunization: Secondary | ICD-10-CM

## 2017-02-22 DIAGNOSIS — O10912 Unspecified pre-existing hypertension complicating pregnancy, second trimester: Secondary | ICD-10-CM

## 2017-02-22 DIAGNOSIS — O0992 Supervision of high risk pregnancy, unspecified, second trimester: Secondary | ICD-10-CM | POA: Diagnosis not present

## 2017-02-22 DIAGNOSIS — O0990 Supervision of high risk pregnancy, unspecified, unspecified trimester: Secondary | ICD-10-CM

## 2017-02-22 LAB — POCT URINALYSIS DIP (DEVICE)
BILIRUBIN URINE: NEGATIVE
Glucose, UA: NEGATIVE mg/dL
HGB URINE DIPSTICK: NEGATIVE
Ketones, ur: NEGATIVE mg/dL
LEUKOCYTES UA: NEGATIVE
NITRITE: NEGATIVE
PH: 6 (ref 5.0–8.0)
Protein, ur: NEGATIVE mg/dL
UROBILINOGEN UA: 0.2 mg/dL (ref 0.0–1.0)

## 2017-02-22 NOTE — Progress Notes (Signed)
Would like flu shot today, tdap next visit.

## 2017-02-22 NOTE — Patient Instructions (Signed)
Third Trimester of Pregnancy The third trimester is from week 28 through week 40 (months 7 through 9). The third trimester is a time when the unborn baby (fetus) is growing rapidly. At the end of the ninth month, the fetus is about 20 inches in length and weighs 6-10 pounds. Body changes during your third trimester Your body will continue to go through many changes during pregnancy. The changes vary from woman to woman. During the third trimester:  Your weight will continue to increase. You can expect to gain 25-35 pounds (11-16 kg) by the end of the pregnancy.  You may begin to get stretch marks on your hips, abdomen, and breasts.  You may urinate more often because the fetus is moving lower into your pelvis and pressing on your bladder.  You may develop or continue to have heartburn. This is caused by increased hormones that slow down muscles in the digestive tract.  You may develop or continue to have constipation because increased hormones slow digestion and cause the muscles that push waste through your intestines to relax.  You may develop hemorrhoids. These are swollen veins (varicose veins) in the rectum that can itch or be painful.  You may develop swollen, bulging veins (varicose veins) in your legs.  You may have increased body aches in the pelvis, back, or thighs. This is due to weight gain and increased hormones that are relaxing your joints.  You may have changes in your hair. These can include thickening of your hair, rapid growth, and changes in texture. Some women also have hair loss during or after pregnancy, or hair that feels dry or thin. Your hair will most likely return to normal after your baby is born.  Your breasts will continue to grow and they will continue to become tender. A yellow fluid (colostrum) may leak from your breasts. This is the first milk you are producing for your baby.  Your belly button may stick out.  You may notice more swelling in your hands,  face, or ankles.  You may have increased tingling or numbness in your hands, arms, and legs. The skin on your belly may also feel numb.  You may feel short of breath because of your expanding uterus.  You may have more problems sleeping. This can be caused by the size of your belly, increased need to urinate, and an increase in your body's metabolism.  You may notice the fetus "dropping," or moving lower in your abdomen (lightening).  You may have increased vaginal discharge.  You may notice your joints feel loose and you may have pain around your pelvic bone.  What to expect at prenatal visits You will have prenatal exams every 2 weeks until week 36. Then you will have weekly prenatal exams. During a routine prenatal visit:  You will be weighed to make sure you and the baby are growing normally.  Your blood pressure will be taken.  Your abdomen will be measured to track your baby's growth.  The fetal heartbeat will be listened to.  Any test results from the previous visit will be discussed.  You may have a cervical check near your due date to see if your cervix has softened or thinned (effaced).  You will be tested for Group B streptococcus. This happens between 35 and 37 weeks.  Your health care provider may ask you:  What your birth plan is.  How you are feeling.  If you are feeling the baby move.  If you have had   any abnormal symptoms, such as leaking fluid, bleeding, severe headaches, or abdominal cramping.  If you are using any tobacco products, including cigarettes, chewing tobacco, and electronic cigarettes.  If you have any questions.  Other tests or screenings that may be performed during your third trimester include:  Blood tests that check for low iron levels (anemia).  Fetal testing to check the health, activity level, and growth of the fetus. Testing is done if you have certain medical conditions or if there are problems during the  pregnancy.  Nonstress test (NST). This test checks the health of your baby to make sure there are no signs of problems, such as the baby not getting enough oxygen. During this test, a belt is placed around your belly. The baby is made to move, and its heart rate is monitored during movement.  What is false labor? False labor is a condition in which you feel small, irregular tightenings of the muscles in the womb (contractions) that usually go away with rest, changing position, or drinking water. These are called Braxton Hicks contractions. Contractions may last for hours, days, or even weeks before true labor sets in. If contractions come at regular intervals, become more frequent, increase in intensity, or become painful, you should see your health care provider. What are the signs of labor?  Abdominal cramps.  Regular contractions that start at 10 minutes apart and become stronger and more frequent with time.  Contractions that start on the top of the uterus and spread down to the lower abdomen and back.  Increased pelvic pressure and dull back pain.  A watery or bloody mucus discharge that comes from the vagina.  Leaking of amniotic fluid. This is also known as your "water breaking." It could be a slow trickle or a gush. Let your health care provider know if it has a color or strange odor. If you have any of these signs, call your health care provider right away, even if it is before your due date. Follow these instructions at home: Medicines  Follow your health care provider's instructions regarding medicine use. Specific medicines may be either safe or unsafe to take during pregnancy.  Take a prenatal vitamin that contains at least 600 micrograms (mcg) of folic acid.  If you develop constipation, try taking a stool softener if your health care provider approves. Eating and drinking  Eat a balanced diet that includes fresh fruits and vegetables, whole grains, good sources of protein  such as meat, eggs, or tofu, and low-fat dairy. Your health care provider will help you determine the amount of weight gain that is right for you.  Avoid raw meat and uncooked cheese. These carry germs that can cause birth defects in the baby.  If you have low calcium intake from food, talk to your health care provider about whether you should take a daily calcium supplement.  Eat four or five small meals rather than three large meals a day.  Limit foods that are high in fat and processed sugars, such as fried and sweet foods.  To prevent constipation: ? Drink enough fluid to keep your urine clear or pale yellow. ? Eat foods that are high in fiber, such as fresh fruits and vegetables, whole grains, and beans. Activity  Exercise only as directed by your health care provider. Most women can continue their usual exercise routine during pregnancy. Try to exercise for 30 minutes at least 5 days a week. Stop exercising if you experience uterine contractions.  Avoid heavy   lifting.  Do not exercise in extreme heat or humidity, or at high altitudes.  Wear low-heel, comfortable shoes.  Practice good posture.  You may continue to have sex unless your health care provider tells you otherwise. Relieving pain and discomfort  Take frequent breaks and rest with your legs elevated if you have leg cramps or low back pain.  Take warm sitz baths to soothe any pain or discomfort caused by hemorrhoids. Use hemorrhoid cream if your health care provider approves.  Wear a good support bra to prevent discomfort from breast tenderness.  If you develop varicose veins: ? Wear support pantyhose or compression stockings as told by your healthcare provider. ? Elevate your feet for 15 minutes, 3-4 times a day. Prenatal care  Write down your questions. Take them to your prenatal visits.  Keep all your prenatal visits as told by your health care provider. This is important. Safety  Wear your seat belt at  all times when driving.  Make a list of emergency phone numbers, including numbers for family, friends, the hospital, and police and fire departments. General instructions  Avoid cat litter boxes and soil used by cats. These carry germs that can cause birth defects in the baby. If you have a cat, ask someone to clean the litter box for you.  Do not travel far distances unless it is absolutely necessary and only with the approval of your health care provider.  Do not use hot tubs, steam rooms, or saunas.  Do not drink alcohol.  Do not use any products that contain nicotine or tobacco, such as cigarettes and e-cigarettes. If you need help quitting, ask your health care provider.  Do not use any medicinal herbs or unprescribed drugs. These chemicals affect the formation and growth of the baby.  Do not douche or use tampons or scented sanitary pads.  Do not cross your legs for long periods of time.  To prepare for the arrival of your baby: ? Take prenatal classes to understand, practice, and ask questions about labor and delivery. ? Make a trial run to the hospital. ? Visit the hospital and tour the maternity area. ? Arrange for maternity or paternity leave through employers. ? Arrange for family and friends to take care of pets while you are in the hospital. ? Purchase a rear-facing car seat and make sure you know how to install it in your car. ? Pack your hospital bag. ? Prepare the baby's nursery. Make sure to remove all pillows and stuffed animals from the baby's crib to prevent suffocation.  Visit your dentist if you have not gone during your pregnancy. Use a soft toothbrush to brush your teeth and be gentle when you floss. Contact a health care provider if:  You are unsure if you are in labor or if your water has broken.  You become dizzy.  You have mild pelvic cramps, pelvic pressure, or nagging pain in your abdominal area.  You have lower back pain.  You have persistent  nausea, vomiting, or diarrhea.  You have an unusual or bad smelling vaginal discharge.  You have pain when you urinate. Get help right away if:  Your water breaks before 37 weeks.  You have regular contractions less than 5 minutes apart before 37 weeks.  You have a fever.  You are leaking fluid from your vagina.  You have spotting or bleeding from your vagina.  You have severe abdominal pain or cramping.  You have rapid weight loss or weight gain.    You have shortness of breath with chest pain.  You notice sudden or extreme swelling of your face, hands, ankles, feet, or legs.  Your baby makes fewer than 10 movements in 2 hours.  You have severe headaches that do not go away when you take medicine.  You have vision changes. Summary  The third trimester is from week 28 through week 40, months 7 through 9. The third trimester is a time when the unborn baby (fetus) is growing rapidly.  During the third trimester, your discomfort may increase as you and your baby continue to gain weight. You may have abdominal, leg, and back pain, sleeping problems, and an increased need to urinate.  During the third trimester your breasts will keep growing and they will continue to become tender. A yellow fluid (colostrum) may leak from your breasts. This is the first milk you are producing for your baby.  False labor is a condition in which you feel small, irregular tightenings of the muscles in the womb (contractions) that eventually go away. These are called Braxton Hicks contractions. Contractions may last for hours, days, or even weeks before true labor sets in.  Signs of labor can include: abdominal cramps; regular contractions that start at 10 minutes apart and become stronger and more frequent with time; watery or bloody mucus discharge that comes from the vagina; increased pelvic pressure and dull back pain; and leaking of amniotic fluid. This information is not intended to replace advice  given to you by your health care provider. Make sure you discuss any questions you have with your health care provider. Document Released: 05/02/2001 Document Revised: 10/14/2015 Document Reviewed: 07/09/2012 Elsevier Interactive Patient Education  2017 [employer]  

## 2017-02-22 NOTE — Progress Notes (Signed)
   PRENATAL VISIT NOTE  Subjective:  Ann Robbins is a 26 y.o. G3P2002 at [redacted]w[redacted]d being seen today for ongoing prenatal care.  She is currently monitored for the following issues for this high-risk pregnancy and has Essential hypertension; Dental decay; Obesity; Supervision of high risk pregnancy, antepartum; Unwanted fertility; Chronic hypertension during pregnancy, antepartum; and History of cervical dysplasia on her problem list.  Patient reports no complaints.  Contractions: Not present. Vag. Bleeding: None.  Movement: Present. Denies leaking of fluid.   The following portions of the patient's history were reviewed and updated as appropriate: allergies, current medications, past family history, past medical history, past social history, past surgical history and problem list. Problem list updated.  Objective:   Vitals:   02/22/17 0855 02/22/17 0859  BP: 140/74 129/72  Pulse: (!) 101 (!) 101  Weight: 221 lb 9.6 oz (100.5 kg)     Fetal Status: Fetal Heart Rate (bpm): 154   Movement: Present     General:  Alert, oriented and cooperative. Patient is in no acute distress.  Skin: Skin is warm and dry. No rash noted.   Cardiovascular: Normal heart rate noted  Respiratory: Normal respiratory effort, no problems with respiration noted  Abdomen: Soft, gravid, appropriate for gestational age.  Pain/Pressure: Absent     Pelvic: Cervical exam deferred        Extremities: Normal range of motion.  Edema: None  Mental Status:  Normal mood and affect. Normal behavior. Normal judgment and thought content.   Assessment and Plan:  Pregnancy: G3P2002 at [redacted]w[redacted]d  1. Supervision of high risk pregnancy, antepartum Routine 28 week labs - Glucose Tolerance, 2 Hours w/1 Hour - CBC - RPR - HIV antibody - Flu Vaccine QUAD 36+ mos IM  2. Chronic hypertension during pregnancy, antepartum [place] reviewed Impression  SIUP at 26+3 weeks  Normal interval anatomy; anatomic survey complete  Normal amniotic  fluid volume  Appropriate interval growth with EFW at the 69th %tile ---------------------------------------------------------------------- Recommendations  Follow-up ultrasound for growth and EV to assess inferior  edge of placenta in 4 weeks ----------------------------------------------------------------------                 Particia Nearing, MD Electronically Signed Final Report   02/21/2017 06:41 pm - Glucose Tolerance, 2 Hours w/1 Hour - CBC - RPR - HIV antibody - Flu Vaccine QUAD 36+ mos IM  Preterm labor symptoms and general obstetric precautions including but not limited to vaginal bleeding, contractions, leaking of fluid and fetal movement were reviewed in detail with the patient. Please refer to After Visit Summary for other counseling recommendations.  Return in about 2 weeks (around 03/08/2017).   Scheryl Darter, MD

## 2017-02-23 LAB — CBC
HEMATOCRIT: 32.2 % — AB (ref 34.0–46.6)
HEMOGLOBIN: 10.5 g/dL — AB (ref 11.1–15.9)
MCH: 30.3 pg (ref 26.6–33.0)
MCHC: 32.6 g/dL (ref 31.5–35.7)
MCV: 93 fL (ref 79–97)
Platelets: 307 10*3/uL (ref 150–379)
RBC: 3.47 x10E6/uL — AB (ref 3.77–5.28)
RDW: 15.3 % (ref 12.3–15.4)
WBC: 9.8 10*3/uL (ref 3.4–10.8)

## 2017-02-23 LAB — GLUCOSE TOLERANCE, 2 HOURS W/ 1HR
Glucose, 1 hour: 126 mg/dL (ref 65–179)
Glucose, 2 hour: 87 mg/dL (ref 65–152)
Glucose, Fasting: 74 mg/dL (ref 65–91)

## 2017-02-23 LAB — RPR: RPR Ser Ql: NONREACTIVE

## 2017-02-23 LAB — HIV ANTIBODY (ROUTINE TESTING W REFLEX): HIV Screen 4th Generation wRfx: NONREACTIVE

## 2017-03-09 ENCOUNTER — Ambulatory Visit (INDEPENDENT_AMBULATORY_CARE_PROVIDER_SITE_OTHER): Payer: Medicaid Other | Admitting: Obstetrics and Gynecology

## 2017-03-09 VITALS — BP 136/65 | HR 113 | Wt 229.0 lb

## 2017-03-09 DIAGNOSIS — O0990 Supervision of high risk pregnancy, unspecified, unspecified trimester: Secondary | ICD-10-CM

## 2017-03-09 DIAGNOSIS — O0993 Supervision of high risk pregnancy, unspecified, third trimester: Secondary | ICD-10-CM | POA: Diagnosis not present

## 2017-03-09 DIAGNOSIS — O10919 Unspecified pre-existing hypertension complicating pregnancy, unspecified trimester: Secondary | ICD-10-CM

## 2017-03-09 DIAGNOSIS — Z23 Encounter for immunization: Secondary | ICD-10-CM

## 2017-03-09 DIAGNOSIS — O10913 Unspecified pre-existing hypertension complicating pregnancy, third trimester: Secondary | ICD-10-CM

## 2017-03-09 NOTE — Addendum Note (Signed)
Addended by: Luella CookMCINTYRE, DIRECE E on: 03/09/2017 09:16 AM   Modules accepted: Orders

## 2017-03-09 NOTE — Patient Instructions (Signed)
Third Trimester of Pregnancy The third trimester is from week 28 through week 40 (months 7 through 9). The third trimester is a time when the unborn baby (fetus) is growing rapidly. At the end of the ninth month, the fetus is about 20 inches in length and weighs 6-10 pounds. Body changes during your third trimester Your body will continue to go through many changes during pregnancy. The changes vary from woman to woman. During the third trimester:  Your weight will continue to increase. You can expect to gain 25-35 pounds (11-16 kg) by the end of the pregnancy.  You may begin to get stretch marks on your hips, abdomen, and breasts.  You may urinate more often because the fetus is moving lower into your pelvis and pressing on your bladder.  You may develop or continue to have heartburn. This is caused by increased hormones that slow down muscles in the digestive tract.  You may develop or continue to have constipation because increased hormones slow digestion and cause the muscles that push waste through your intestines to relax.  You may develop hemorrhoids. These are swollen veins (varicose veins) in the rectum that can itch or be painful.  You may develop swollen, bulging veins (varicose veins) in your legs.  You may have increased body aches in the pelvis, back, or thighs. This is due to weight gain and increased hormones that are relaxing your joints.  You may have changes in your hair. These can include thickening of your hair, rapid growth, and changes in texture. Some women also have hair loss during or after pregnancy, or hair that feels dry or thin. Your hair will most likely return to normal after your baby is born.  Your breasts will continue to grow and they will continue to become tender. A yellow fluid (colostrum) may leak from your breasts. This is the first milk you are producing for your baby.  Your belly button may stick out.  You may notice more swelling in your hands,  face, or ankles.  You may have increased tingling or numbness in your hands, arms, and legs. The skin on your belly may also feel numb.  You may feel short of breath because of your expanding uterus.  You may have more problems sleeping. This can be caused by the size of your belly, increased need to urinate, and an increase in your body's metabolism.  You may notice the fetus "dropping," or moving lower in your abdomen (lightening).  You may have increased vaginal discharge.  You may notice your joints feel loose and you may have pain around your pelvic bone.  What to expect at prenatal visits You will have prenatal exams every 2 weeks until week 36. Then you will have weekly prenatal exams. During a routine prenatal visit:  You will be weighed to make sure you and the baby are growing normally.  Your blood pressure will be taken.  Your abdomen will be measured to track your baby's growth.  The fetal heartbeat will be listened to.  Any test results from the previous visit will be discussed.  You may have a cervical check near your due date to see if your cervix has softened or thinned (effaced).  You will be tested for Group B streptococcus. This happens between 35 and 37 weeks.  Your health care provider may ask you:  What your birth plan is.  How you are feeling.  If you are feeling the baby move.  If you have had   any abnormal symptoms, such as leaking fluid, bleeding, severe headaches, or abdominal cramping.  If you are using any tobacco products, including cigarettes, chewing tobacco, and electronic cigarettes.  If you have any questions.  Other tests or screenings that may be performed during your third trimester include:  Blood tests that check for low iron levels (anemia).  Fetal testing to check the health, activity level, and growth of the fetus. Testing is done if you have certain medical conditions or if there are problems during the  pregnancy.  Nonstress test (NST). This test checks the health of your baby to make sure there are no signs of problems, such as the baby not getting enough oxygen. During this test, a belt is placed around your belly. The baby is made to move, and its heart rate is monitored during movement.  What is false labor? False labor is a condition in which you feel small, irregular tightenings of the muscles in the womb (contractions) that usually go away with rest, changing position, or drinking water. These are called Braxton Hicks contractions. Contractions may last for hours, days, or even weeks before true labor sets in. If contractions come at regular intervals, become more frequent, increase in intensity, or become painful, you should see your health care provider. What are the signs of labor?  Abdominal cramps.  Regular contractions that start at 10 minutes apart and become stronger and more frequent with time.  Contractions that start on the top of the uterus and spread down to the lower abdomen and back.  Increased pelvic pressure and dull back pain.  A watery or bloody mucus discharge that comes from the vagina.  Leaking of amniotic fluid. This is also known as your "water breaking." It could be a slow trickle or a gush. Let your health care provider know if it has a color or strange odor. If you have any of these signs, call your health care provider right away, even if it is before your due date. Follow these instructions at home: Medicines  Follow your health care provider's instructions regarding medicine use. Specific medicines may be either safe or unsafe to take during pregnancy.  Take a prenatal vitamin that contains at least 600 micrograms (mcg) of folic acid.  If you develop constipation, try taking a stool softener if your health care provider approves. Eating and drinking  Eat a balanced diet that includes fresh fruits and vegetables, whole grains, good sources of protein  such as meat, eggs, or tofu, and low-fat dairy. Your health care provider will help you determine the amount of weight gain that is right for you.  Avoid raw meat and uncooked cheese. These carry germs that can cause birth defects in the baby.  If you have low calcium intake from food, talk to your health care provider about whether you should take a daily calcium supplement.  Eat four or five small meals rather than three large meals a day.  Limit foods that are high in fat and processed sugars, such as fried and sweet foods.  To prevent constipation: ? Drink enough fluid to keep your urine clear or pale yellow. ? Eat foods that are high in fiber, such as fresh fruits and vegetables, whole grains, and beans. Activity  Exercise only as directed by your health care provider. Most women can continue their usual exercise routine during pregnancy. Try to exercise for 30 minutes at least 5 days a week. Stop exercising if you experience uterine contractions.  Avoid heavy   lifting.  Do not exercise in extreme heat or humidity, or at high altitudes.  Wear low-heel, comfortable shoes.  Practice good posture.  You may continue to have sex unless your health care provider tells you otherwise. Relieving pain and discomfort  Take frequent breaks and rest with your legs elevated if you have leg cramps or low back pain.  Take warm sitz baths to soothe any pain or discomfort caused by hemorrhoids. Use hemorrhoid cream if your health care provider approves.  Wear a good support bra to prevent discomfort from breast tenderness.  If you develop varicose veins: ? Wear support pantyhose or compression stockings as told by your healthcare provider. ? Elevate your feet for 15 minutes, 3-4 times a day. Prenatal care  Write down your questions. Take them to your prenatal visits.  Keep all your prenatal visits as told by your health care provider. This is important. Safety  Wear your seat belt at  all times when driving.  Make a list of emergency phone numbers, including numbers for family, friends, the hospital, and police and fire departments. General instructions  Avoid cat litter boxes and soil used by cats. These carry germs that can cause birth defects in the baby. If you have a cat, ask someone to clean the litter box for you.  Do not travel far distances unless it is absolutely necessary and only with the approval of your health care provider.  Do not use hot tubs, steam rooms, or saunas.  Do not drink alcohol.  Do not use any products that contain nicotine or tobacco, such as cigarettes and e-cigarettes. If you need help quitting, ask your health care provider.  Do not use any medicinal herbs or unprescribed drugs. These chemicals affect the formation and growth of the baby.  Do not douche or use tampons or scented sanitary pads.  Do not cross your legs for long periods of time.  To prepare for the arrival of your baby: ? Take prenatal classes to understand, practice, and ask questions about labor and delivery. ? Make a trial run to the hospital. ? Visit the hospital and tour the maternity area. ? Arrange for maternity or paternity leave through employers. ? Arrange for family and friends to take care of pets while you are in the hospital. ? Purchase a rear-facing car seat and make sure you know how to install it in your car. ? Pack your hospital bag. ? Prepare the baby's nursery. Make sure to remove all pillows and stuffed animals from the baby's crib to prevent suffocation.  Visit your dentist if you have not gone during your pregnancy. Use a soft toothbrush to brush your teeth and be gentle when you floss. Contact a health care provider if:  You are unsure if you are in labor or if your water has broken.  You become dizzy.  You have mild pelvic cramps, pelvic pressure, or nagging pain in your abdominal area.  You have lower back pain.  You have persistent  nausea, vomiting, or diarrhea.  You have an unusual or bad smelling vaginal discharge.  You have pain when you urinate. Get help right away if:  Your water breaks before 37 weeks.  You have regular contractions less than 5 minutes apart before 37 weeks.  You have a fever.  You are leaking fluid from your vagina.  You have spotting or bleeding from your vagina.  You have severe abdominal pain or cramping.  You have rapid weight loss or weight gain.    You have shortness of breath with chest pain.  You notice sudden or extreme swelling of your face, hands, ankles, feet, or legs.  Your baby makes fewer than 10 movements in 2 hours.  You have severe headaches that do not go away when you take medicine.  You have vision changes. Summary  The third trimester is from week 28 through week 40, months 7 through 9. The third trimester is a time when the unborn baby (fetus) is growing rapidly.  During the third trimester, your discomfort may increase as you and your baby continue to gain weight. You may have abdominal, leg, and back pain, sleeping problems, and an increased need to urinate.  During the third trimester your breasts will keep growing and they will continue to become tender. A yellow fluid (colostrum) may leak from your breasts. This is the first milk you are producing for your baby.  False labor is a condition in which you feel small, irregular tightenings of the muscles in the womb (contractions) that eventually go away. These are called Braxton Hicks contractions. Contractions may last for hours, days, or even weeks before true labor sets in.  Signs of labor can include: abdominal cramps; regular contractions that start at 10 minutes apart and become stronger and more frequent with time; watery or bloody mucus discharge that comes from the vagina; increased pelvic pressure and dull back pain; and leaking of amniotic fluid. This information is not intended to replace advice  given to you by your health care provider. Make sure you discuss any questions you have with your health care provider. Document Released: 05/02/2001 Document Revised: 10/14/2015 Document Reviewed: 07/09/2012 Elsevier Interactive Patient Education  2017 [employer]  

## 2017-03-09 NOTE — Progress Notes (Signed)
   PRENATAL VISIT NOTE  Subjective:  Ann Robbins is a 26 y.o. G3P2002 at 9822w5d being seen today for ongoing prenatal care.  She is currently monitored for the following issues for this high-risk pregnancy and has Essential hypertension; Dental decay; Obesity; Supervision of high risk pregnancy, antepartum; Unwanted fertility; Chronic hypertension during pregnancy, antepartum; and History of cervical dysplasia on her problem list.  Patient reports leg pain with standing too long.  Contractions: Not present.  .  Movement: Present. Denies leaking of fluid.   The following portions of the patient's history were reviewed and updated as appropriate: allergies, current medications, past family history, past medical history, past social history, past surgical history and problem list. Problem list updated.  Objective:   Vitals:   03/09/17 0812  BP: 136/65  Pulse: (!) 113  Weight: 229 lb (103.9 kg)    Fetal Status: Fetal Heart Rate (bpm): 161 Fundal Height: 30 cm Movement: Present     General:  Alert, oriented and cooperative. Patient is in no acute distress.  Skin: Skin is warm and dry. No rash noted.   Cardiovascular: Normal heart rate noted  Respiratory: Normal respiratory effort, no problems with respiration noted  Abdomen: Soft, gravid, appropriate for gestational age.  Pain/Pressure: Absent     Pelvic: Cervical exam deferred        Extremities: Normal range of motion.     Mental Status:  Normal mood and affect. Normal behavior. Normal judgment and thought content.   Assessment and Plan:  Pregnancy: G3P2002 at 422w5d  1. Chronic hypertension during pregnancy, antepartum BP controlled Cont labetalol 100 mg BID  2. Supervision of high risk pregnancy, antepartum BTL papers signed today Tdap today   Preterm labor symptoms and general obstetric precautions including but not limited to vaginal bleeding, contractions, leaking of fluid and fetal movement were reviewed in detail with  the patient. Please refer to After Visit Summary for other counseling recommendations.  Return in about 2 weeks (around 03/23/2017) for OB visit (MD).   Conan BowensKelly M Genevra Orne, MD

## 2017-03-09 NOTE — Progress Notes (Signed)
Patient stated she has been having pain & pressure in her legs x 1 week.

## 2017-03-14 ENCOUNTER — Encounter: Payer: Self-pay | Admitting: *Deleted

## 2017-03-21 ENCOUNTER — Other Ambulatory Visit: Payer: Self-pay | Admitting: Obstetrics and Gynecology

## 2017-03-21 ENCOUNTER — Ambulatory Visit (HOSPITAL_COMMUNITY)
Admission: RE | Admit: 2017-03-21 | Discharge: 2017-03-21 | Disposition: A | Discharge status: Home / Self Care | Payer: Medicaid Other | Source: Ambulatory Visit | Attending: Obstetrics and Gynecology | Admitting: Obstetrics and Gynecology

## 2017-03-21 DIAGNOSIS — Z3A30 30 weeks gestation of pregnancy: Secondary | ICD-10-CM | POA: Diagnosis not present

## 2017-03-21 DIAGNOSIS — O4440 Low lying placenta NOS or without hemorrhage, unspecified trimester: Secondary | ICD-10-CM

## 2017-03-21 DIAGNOSIS — O10013 Pre-existing essential hypertension complicating pregnancy, third trimester: Secondary | ICD-10-CM | POA: Diagnosis present

## 2017-03-21 DIAGNOSIS — O0990 Supervision of high risk pregnancy, unspecified, unspecified trimester: Secondary | ICD-10-CM

## 2017-03-21 DIAGNOSIS — O10019 Pre-existing essential hypertension complicating pregnancy, unspecified trimester: Secondary | ICD-10-CM

## 2017-03-21 DIAGNOSIS — Z3009 Encounter for other general counseling and advice on contraception: Secondary | ICD-10-CM

## 2017-03-23 ENCOUNTER — Encounter: Payer: Self-pay | Admitting: *Deleted

## 2017-03-23 ENCOUNTER — Ambulatory Visit (INDEPENDENT_AMBULATORY_CARE_PROVIDER_SITE_OTHER): Payer: Medicaid Other | Admitting: Obstetrics and Gynecology

## 2017-03-23 VITALS — BP 130/78 | HR 108 | Wt 227.0 lb

## 2017-03-23 DIAGNOSIS — O10919 Unspecified pre-existing hypertension complicating pregnancy, unspecified trimester: Secondary | ICD-10-CM

## 2017-03-23 DIAGNOSIS — O10913 Unspecified pre-existing hypertension complicating pregnancy, third trimester: Secondary | ICD-10-CM

## 2017-03-23 DIAGNOSIS — O0990 Supervision of high risk pregnancy, unspecified, unspecified trimester: Secondary | ICD-10-CM

## 2017-03-23 NOTE — Progress Notes (Signed)
Subjective:  Ann Robbins is a 26 y.o. G3P2002 at 9189w5d being seen today for ongoing prenatal care.  She is currently monitored for the following issues for this high-risk pregnancy and has Essential hypertension; Dental decay; Obesity; Supervision of high risk pregnancy, antepartum; Unwanted fertility; Chronic hypertension during pregnancy, antepartum; and History of cervical dysplasia on her problem list.  Patient reports no complaints.  Contractions: Not present. Vag. Bleeding: None.  Movement: Present. Denies leaking of fluid.   The following portions of the patient's history were reviewed and updated as appropriate: allergies, current medications, past family history, past medical history, past social history, past surgical history and problem list. Problem list updated.  Objective:   Vitals:   03/23/17 1054  BP: 130/78  Pulse: (!) 108  Weight: 103 kg (227 lb)    Fetal Status: Fetal Heart Rate (bpm): 158   Movement: Present     General:  Alert, oriented and cooperative. Patient is in no acute distress.  Skin: Skin is warm and dry. No rash noted.   Cardiovascular: Normal heart rate noted  Respiratory: Normal respiratory effort, no problems with respiration noted  Abdomen: Soft, gravid, appropriate for gestational age. Pain/Pressure: Absent     Pelvic:  Cervical exam deferred        Extremities: Normal range of motion.  Edema: None  Mental Status: Normal mood and affect. Normal behavior. Normal judgment and thought content.   Urinalysis:      Assessment and Plan:  Pregnancy: G3P2002 at 289w5d  1. Chronic hypertension during pregnancy, antepartum Stable Continue with BASA and labetalol Start weekly antenatal test with next OB visit Normal growth on 10/31, f/u scheduled  2. Supervision of high risk pregnancy, antepartum Stable BTL papers signed 03/09/17  Preterm labor symptoms and general obstetric precautions including but not limited to vaginal bleeding, contractions,  leaking of fluid and fetal movement were reviewed in detail with the patient. Please refer to After Visit Summary for other counseling recommendations.  Return in about 2 weeks (around 04/06/2017) for OB visit.   Hermina StaggersErvin, Shemar Plemmons L, MD

## 2017-03-30 ENCOUNTER — Telehealth: Payer: Self-pay | Admitting: *Deleted

## 2017-03-30 ENCOUNTER — Other Ambulatory Visit: Payer: Self-pay | Admitting: *Deleted

## 2017-03-30 DIAGNOSIS — O10919 Unspecified pre-existing hypertension complicating pregnancy, unspecified trimester: Secondary | ICD-10-CM

## 2017-03-30 NOTE — Telephone Encounter (Addendum)
Called pt to discuss future appts. Message left that I will call her back next week. Pt needs to be informed that she will be starting fetal testing beginning 11/16. Due to scheduling conflict, she will have NST only on that [place] as well as visit w/provider and then begin weekly NST/BPP starting 11/19 or 11/20. This regimen was reviewed with and approved by Dr. Alysia PennaErvin.   11/14  1045  Called pt and left message again that I am trying to reach her to discuss future appts. We will discuss at her appt on 11/16 @ 1000.

## 2017-04-06 ENCOUNTER — Ambulatory Visit (INDEPENDENT_AMBULATORY_CARE_PROVIDER_SITE_OTHER): Payer: Medicaid Other | Admitting: General Practice

## 2017-04-06 ENCOUNTER — Ambulatory Visit (INDEPENDENT_AMBULATORY_CARE_PROVIDER_SITE_OTHER): Payer: Medicaid Other | Admitting: Family Medicine

## 2017-04-06 VITALS — BP 124/70 | HR 109 | Wt 230.0 lb

## 2017-04-06 DIAGNOSIS — O10913 Unspecified pre-existing hypertension complicating pregnancy, third trimester: Secondary | ICD-10-CM

## 2017-04-06 DIAGNOSIS — O0990 Supervision of high risk pregnancy, unspecified, unspecified trimester: Secondary | ICD-10-CM

## 2017-04-06 DIAGNOSIS — O10919 Unspecified pre-existing hypertension complicating pregnancy, unspecified trimester: Secondary | ICD-10-CM

## 2017-04-06 DIAGNOSIS — O0993 Supervision of high risk pregnancy, unspecified, third trimester: Secondary | ICD-10-CM

## 2017-04-06 LAB — POCT URINALYSIS DIP (DEVICE)
Bilirubin Urine: NEGATIVE
Glucose, UA: NEGATIVE mg/dL
HGB URINE DIPSTICK: NEGATIVE
Ketones, ur: NEGATIVE mg/dL
LEUKOCYTES UA: NEGATIVE
Nitrite: NEGATIVE
PH: 7 (ref 5.0–8.0)
Protein, ur: NEGATIVE mg/dL
SPECIFIC GRAVITY, URINE: 1.02 (ref 1.005–1.030)
UROBILINOGEN UA: 0.2 mg/dL (ref 0.0–1.0)

## 2017-04-06 NOTE — Progress Notes (Signed)
   PRENATAL VISIT NOTE  Subjective:  Ann Robbins is a 26 y.o. G3P2002 at 2494w5d being seen today for ongoing prenatal care.  She is currently monitored for the following issues for this high-risk pregnancy and has Essential hypertension; Dental decay; Obesity; Supervision of high risk pregnancy, antepartum; Unwanted fertility; Chronic hypertension during pregnancy, antepartum; and History of cervical dysplasia on their problem list.  Patient reports no complaints.  Contractions: Irritability. Vag. Bleeding: None.  Movement: Present. Denies leaking of fluid.   The following portions of the patient's history were reviewed and updated as appropriate: allergies, current medications, past family history, past medical history, past social history, past surgical history and problem list. Problem list updated.  Objective:   Vitals:   04/06/17 1019  BP: 124/70  Pulse: (!) 109  Weight: 230 lb (104.3 kg)    Fetal Status:     Movement: Present     General:  Alert, oriented and cooperative. Patient is in no acute distress.  Skin: Skin is warm and dry. No rash noted.   Cardiovascular: Normal heart rate noted  Respiratory: Normal respiratory effort, no problems with respiration noted  Abdomen: Soft, gravid, appropriate for gestational age.  Pain/Pressure: Absent     Pelvic: Cervical exam deferred        Extremities: Normal range of motion.  Edema: None  Mental Status:  Normal mood and affect. Normal behavior. Normal judgment and thought content.  NST:  Baseline: 150 bpm, Variability: Good {> 6 bpm), Accelerations: Reactive and Decelerations: Absent   Assessment and Plan:  Pregnancy: G3P2002 at 5994w5d  1. Chronic hypertension during pregnancy, antepartum BP is at goal--continue Labetalol, ASA  2. Supervision of high risk pregnancy, antepartum Continue prenatal care.   Preterm labor symptoms and general obstetric precautions including but not limited to vaginal bleeding, contractions,  leaking of fluid and fetal movement were reviewed in detail with the patient. Please refer to After Visit Summary for other counseling recommendations.  No Follow-up on file.   Reva Boresanya S Pratt, MD

## 2017-04-06 NOTE — Patient Instructions (Signed)
 Third Trimester of Pregnancy The third trimester is from week 28 through week 40 (months 7 through 9). The third trimester is a time when the unborn baby (fetus) is growing rapidly. At the end of the ninth month, the fetus is about 20 inches in length and weighs 6-10 pounds. Body changes during your third trimester Your body will continue to go through many changes during pregnancy. The changes vary from woman to woman. During the third trimester:  Your weight will continue to increase. You can expect to gain 25-35 pounds (11-16 kg) by the end of the pregnancy.  You may begin to get stretch marks on your hips, abdomen, and breasts.  You may urinate more often because the fetus is moving lower into your pelvis and pressing on your bladder.  You may develop or continue to have heartburn. This is caused by increased hormones that slow down muscles in the digestive tract.  You may develop or continue to have constipation because increased hormones slow digestion and cause the muscles that push waste through your intestines to relax.  You may develop hemorrhoids. These are swollen veins (varicose veins) in the rectum that can itch or be painful.  You may develop swollen, bulging veins (varicose veins) in your legs.  You may have increased body aches in the pelvis, back, or thighs. This is due to weight gain and increased hormones that are relaxing your joints.  You may have changes in your hair. These can include thickening of your hair, rapid growth, and changes in texture. Some women also have hair loss during or after pregnancy, or hair that feels dry or thin. Your hair will most likely return to normal after your baby is born.  Your breasts will continue to grow and they will continue to become tender. A yellow fluid (colostrum) may leak from your breasts. This is the first milk you are producing for your baby.  Your belly button may stick out.  You may notice more swelling in your  hands, face, or ankles.  You may have increased tingling or numbness in your hands, arms, and legs. The skin on your belly may also feel numb.  You may feel short of breath because of your expanding uterus.  You may have more problems sleeping. This can be caused by the size of your belly, increased need to urinate, and an increase in your body's metabolism.  You may notice the fetus "dropping," or moving lower in your abdomen (lightening).  You may have increased vaginal discharge.  You may notice your joints feel loose and you may have pain around your pelvic bone.  What to expect at prenatal visits You will have prenatal exams every 2 weeks until week 36. Then you will have weekly prenatal exams. During a routine prenatal visit:  You will be weighed to make sure you and the baby are growing normally.  Your blood pressure will be taken.  Your abdomen will be measured to track your baby's growth.  The fetal heartbeat will be listened to.  Any test results from the previous visit will be discussed.  You may have a cervical check near your due date to see if your cervix has softened or thinned (effaced).  You will be tested for Group B streptococcus. This happens between 35 and 37 weeks.  Your health care provider may ask you:  What your birth plan is.  How you are feeling.  If you are feeling the baby move.  If you have   had any abnormal symptoms, such as leaking fluid, bleeding, severe headaches, or abdominal cramping.  If you are using any tobacco products, including cigarettes, chewing tobacco, and electronic cigarettes.  If you have any questions.  Other tests or screenings that may be performed during your third trimester include:  Blood tests that check for low iron levels (anemia).  Fetal testing to check the health, activity level, and growth of the fetus. Testing is done if you have certain medical conditions or if there are problems during the  pregnancy.  Nonstress test (NST). This test checks the health of your baby to make sure there are no signs of problems, such as the baby not getting enough oxygen. During this test, a belt is placed around your belly. The baby is made to move, and its heart rate is monitored during movement.  What is false labor? False labor is a condition in which you feel small, irregular tightenings of the muscles in the womb (contractions) that usually go away with rest, changing position, or drinking water. These are called Braxton Hicks contractions. Contractions may last for hours, days, or even weeks before true labor sets in. If contractions come at regular intervals, become more frequent, increase in intensity, or become painful, you should see your health care provider. What are the signs of labor?  Abdominal cramps.  Regular contractions that start at 10 minutes apart and become stronger and more frequent with time.  Contractions that start on the top of the uterus and spread down to the lower abdomen and back.  Increased pelvic pressure and dull back pain.  A watery or bloody mucus discharge that comes from the vagina.  Leaking of amniotic fluid. This is also known as your "water breaking." It could be a slow trickle or a gush. Let your health care provider know if it has a color or strange odor. If you have any of these signs, call your health care provider right away, even if it is before your due date. Follow these instructions at home: Medicines  Follow your health care provider's instructions regarding medicine use. Specific medicines may be either safe or unsafe to take during pregnancy.  Take a prenatal vitamin that contains at least 600 micrograms (mcg) of folic acid.  If you develop constipation, try taking a stool softener if your health care provider approves. Eating and drinking  Eat a balanced diet that includes fresh fruits and vegetables, whole grains, good sources of protein  such as meat, eggs, or tofu, and low-fat dairy. Your health care provider will help you determine the amount of weight gain that is right for you.  Avoid raw meat and uncooked cheese. These carry germs that can cause birth defects in the baby.  If you have low calcium intake from food, talk to your health care provider about whether you should take a daily calcium supplement.  Eat four or five small meals rather than three large meals a day.  Limit foods that are high in fat and processed sugars, such as fried and sweet foods.  To prevent constipation: ? Drink enough fluid to keep your urine clear or pale yellow. ? Eat foods that are high in fiber, such as fresh fruits and vegetables, whole grains, and beans. Activity  Exercise only as directed by your health care provider. Most women can continue their usual exercise routine during pregnancy. Try to exercise for 30 minutes at least 5 days a week. Stop exercising if you experience uterine contractions.  Avoid   heavy lifting.  Do not exercise in extreme heat or humidity, or at high altitudes.  Wear low-heel, comfortable shoes.  Practice good posture.  You may continue to have sex unless your health care provider tells you otherwise. Relieving pain and discomfort  Take frequent breaks and rest with your legs elevated if you have leg cramps or low back pain.  Take warm sitz baths to soothe any pain or discomfort caused by hemorrhoids. Use hemorrhoid cream if your health care provider approves.  Wear a good support bra to prevent discomfort from breast tenderness.  If you develop varicose veins: ? Wear support pantyhose or compression stockings as told by your healthcare provider. ? Elevate your feet for 15 minutes, 3-4 times a day. Prenatal care  Write down your questions. Take them to your prenatal visits.  Keep all your prenatal visits as told by your health care provider. This is important. Safety  Wear your seat belt at  all times when driving.  Make a list of emergency phone numbers, including numbers for family, friends, the hospital, and police and fire departments. General instructions  Avoid cat litter boxes and soil used by cats. These carry germs that can cause birth defects in the baby. If you have a cat, ask someone to clean the litter box for you.  Do not travel far distances unless it is absolutely necessary and only with the approval of your health care provider.  Do not use hot tubs, steam rooms, or saunas.  Do not drink alcohol.  Do not use any products that contain nicotine or tobacco, such as cigarettes and e-cigarettes. If you need help quitting, ask your health care provider.  Do not use any medicinal herbs or unprescribed drugs. These chemicals affect the formation and growth of the baby.  Do not douche or use tampons or scented sanitary pads.  Do not cross your legs for long periods of time.  To prepare for the arrival of your baby: ? Take prenatal classes to understand, practice, and ask questions about labor and delivery. ? Make a trial run to the hospital. ? Visit the hospital and tour the maternity area. ? Arrange for maternity or paternity leave through employers. ? Arrange for family and friends to take care of pets while you are in the hospital. ? Purchase a rear-facing car seat and make sure you know how to install it in your car. ? Pack your hospital bag. ? Prepare the baby's nursery. Make sure to remove all pillows and stuffed animals from the baby's crib to prevent suffocation.  Visit your dentist if you have not gone during your pregnancy. Use a soft toothbrush to brush your teeth and be gentle when you floss. Contact a health care provider if:  You are unsure if you are in labor or if your water has broken.  You become dizzy.  You have mild pelvic cramps, pelvic pressure, or nagging pain in your abdominal area.  You have lower back pain.  You have persistent  nausea, vomiting, or diarrhea.  You have an unusual or bad smelling vaginal discharge.  You have pain when you urinate. Get help right away if:  Your water breaks before 37 weeks.  You have regular contractions less than 5 minutes apart before 37 weeks.  You have a fever.  You are leaking fluid from your vagina.  You have spotting or bleeding from your vagina.  You have severe abdominal pain or cramping.  You have rapid weight loss or weight   gain.  You have shortness of breath with chest pain.  You notice sudden or extreme swelling of your face, hands, ankles, feet, or legs.  Your baby makes fewer than 10 movements in 2 hours.  You have severe headaches that do not go away when you take medicine.  You have vision changes. Summary  The third trimester is from week 28 through week 40, months 7 through 9. The third trimester is a time when the unborn baby (fetus) is growing rapidly.  During the third trimester, your discomfort may increase as you and your baby continue to gain weight. You may have abdominal, leg, and back pain, sleeping problems, and an increased need to urinate.  During the third trimester your breasts will keep growing and they will continue to become tender. A yellow fluid (colostrum) may leak from your breasts. This is the first milk you are producing for your baby.  False labor is a condition in which you feel small, irregular tightenings of the muscles in the womb (contractions) that eventually go away. These are called Braxton Hicks contractions. Contractions may last for hours, days, or even weeks before true labor sets in.  Signs of labor can include: abdominal cramps; regular contractions that start at 10 minutes apart and become stronger and more frequent with time; watery or bloody mucus discharge that comes from the vagina; increased pelvic pressure and dull back pain; and leaking of amniotic fluid. This information is not intended to replace advice  given to you by your health care provider. Make sure you discuss any questions you have with your health care provider. Document Released: 05/02/2001 Document Revised: 10/14/2015 Document Reviewed: 07/09/2012 [employer] Interactive Patient Education  2017 [employer]   Breastfeeding Deciding to breastfeed is one of the best choices you can make for you and your baby. A change in hormones during pregnancy causes your breast tissue to grow and increases the number and size of your milk ducts. These hormones also allow proteins, sugars, and fats from your blood supply to make breast milk in your milk-producing glands. Hormones prevent breast milk from being released before your baby is born as well as prompt milk flow after birth. Once breastfeeding has begun, thoughts of your baby, as well as his or her sucking or crying, can stimulate the release of milk from your milk-producing glands. Benefits of breastfeeding For Your Baby  Your first milk (colostrum) helps your baby's digestive system function better.  There are antibodies in your milk that help your baby fight off infections.  Your baby has a lower incidence of asthma, allergies, and sudden infant death syndrome.  The nutrients in breast milk are better for your baby than infant formulas and are designed uniquely for your baby's needs.  Breast milk improves your baby's brain development.  Your baby is less likely to develop other conditions, such as childhood obesity, asthma, or type 2 diabetes mellitus.  For You  Breastfeeding helps to create a very special bond between you and your baby.  Breastfeeding is convenient. Breast milk is always available at the correct temperature and costs nothing.  Breastfeeding helps to burn calories and helps you lose the weight gained during pregnancy.  Breastfeeding makes your uterus contract to its prepregnancy size faster and slows bleeding (lochia) after you give birth.  Breastfeeding helps  to lower your risk of developing type 2 diabetes mellitus, osteoporosis, and breast or ovarian cancer later in life.  Signs that your baby is hungry Early Signs of Hunger    Increased alertness or activity.  Stretching.  Movement of the head from side to side.  Movement of the head and opening of the mouth when the corner of the mouth or cheek is stroked (rooting).  Increased sucking sounds, smacking lips, cooing, sighing, or squeaking.  Hand-to-mouth movements.  Increased sucking of fingers or hands.  Late Signs of Hunger  Fussing.  Intermittent crying.  Extreme Signs of Hunger Signs of extreme hunger will require calming and consoling before your baby will be able to breastfeed successfully. Do not wait for the following signs of extreme hunger to occur before you initiate breastfeeding:  Restlessness.  A loud, strong cry.  Screaming.  Breastfeeding basics Breastfeeding Initiation  Find a comfortable place to sit or lie down, with your neck and back well supported.  Place a pillow or rolled up blanket under your baby to bring him or her to the level of your breast (if you are seated). Nursing pillows are specially designed to help support your arms and your baby while you breastfeed.  Make sure that your baby's abdomen is facing your abdomen.  Gently massage your breast. With your fingertips, massage from your chest wall toward your nipple in a circular motion. This encourages milk flow. You may need to continue this action during the feeding if your milk flows slowly.  Support your breast with 4 fingers underneath and your thumb above your nipple. Make sure your fingers are well away from your nipple and your baby's mouth.  Stroke your baby's lips gently with your finger or nipple.  When your baby's mouth is open wide enough, quickly bring your baby to your breast, placing your entire nipple and as much of the colored area around your nipple (areola) as possible into  your baby's mouth. ? More areola should be visible above your baby's upper lip than below the lower lip. ? Your baby's tongue should be between his or her lower gum and your breast.  Ensure that your baby's mouth is correctly positioned around your nipple (latched). Your baby's lips should create a seal on your breast and be turned out (everted).  It is common for your baby to suck about 2-3 minutes in order to start the flow of breast milk.  Latching Teaching your baby how to latch on to your breast properly is very important. An improper latch can cause nipple pain and decreased milk supply for you and poor weight gain in your baby. Also, if your baby is not latched onto your nipple properly, he or she may swallow some air during feeding. This can make your baby fussy. Burping your baby when you switch breasts during the feeding can help to get rid of the air. However, teaching your baby to latch on properly is still the best way to prevent fussiness from swallowing air while breastfeeding. Signs that your baby has successfully latched on to your nipple:  Silent tugging or silent sucking, without causing you pain.  Swallowing heard between every 3-4 sucks.  Muscle movement above and in front of his or her ears while sucking.  Signs that your baby has not successfully latched on to nipple:  Sucking sounds or smacking sounds from your baby while breastfeeding.  Nipple pain.  If you think your baby has not latched on correctly, slip your finger into the corner of your baby's mouth to break the suction and place it between your baby's gums. Attempt breastfeeding initiation again. Signs of Successful Breastfeeding Signs from your baby:  A   gradual decrease in the number of sucks or complete cessation of sucking.  Falling asleep.  Relaxation of his or her body.  Retention of a small amount of milk in his or her mouth.  Letting go of your breast by himself or herself.  Signs from  you:  Breasts that have increased in firmness, weight, and size 1-3 hours after feeding.  Breasts that are softer immediately after breastfeeding.  Increased milk volume, as well as a change in milk consistency and color by the fifth day of breastfeeding.  Nipples that are not sore, cracked, or bleeding.  Signs That Your Baby is Getting Enough Milk  Wetting at least 1-2 diapers during the first 24 hours after birth.  Wetting at least 5-6 diapers every 24 hours for the first week after birth. The urine should be clear or pale yellow by 5 days after birth.  Wetting 6-8 diapers every 24 hours as your baby continues to grow and develop.  At least 3 stools in a 24-hour period by age 5 days. The stool should be soft and yellow.  At least 3 stools in a 24-hour period by age 7 days. The stool should be seedy and yellow.  No loss of weight greater than 10% of birth weight during the first 3 days of age.  Average weight gain of 4-7 ounces (113-198 g) per week after age 4 days.  Consistent daily weight gain by age 5 days, without weight loss after the age of 2 weeks.  After a feeding, your baby may spit up a small amount. This is common. Breastfeeding frequency and duration Frequent feeding will help you make more milk and can prevent sore nipples and breast engorgement. Breastfeed when you feel the need to reduce the fullness of your breasts or when your baby shows signs of hunger. This is called "breastfeeding on demand." Avoid introducing a pacifier to your baby while you are working to establish breastfeeding (the first 4-6 weeks after your baby is born). After this time you may choose to use a pacifier. Research has shown that pacifier use during the first year of a baby's life decreases the risk of sudden infant death syndrome (SIDS). Allow your baby to feed on each breast as long as he or she wants. Breastfeed until your baby is finished feeding. When your baby unlatches or falls asleep  while feeding from the first breast, offer the second breast. Because newborns are often sleepy in the first few weeks of life, you may need to awaken your baby to get him or her to feed. Breastfeeding times will vary from baby to baby. However, the following rules can serve as a guide to help you ensure that your baby is properly fed:  Newborns (babies 4 weeks of age or younger) may breastfeed every 1-3 hours.  Newborns should not go longer than 3 hours during the day or 5 hours during the night without breastfeeding.  You should breastfeed your baby a minimum of 8 times in a 24-hour period until you begin to introduce solid foods to your baby at around 6 months of age.  Breast milk pumping Pumping and storing breast milk allows you to ensure that your baby is exclusively fed your breast milk, even at times when you are unable to breastfeed. This is especially important if you are going back to work while you are still breastfeeding or when you are not able to be present during feedings. Your lactation consultant can give you guidelines on how   long it is safe to store breast milk. A breast pump is a machine that allows you to pump milk from your breast into a sterile bottle. The pumped breast milk can then be stored in a refrigerator or freezer. Some breast pumps are operated by hand, while others use electricity. Ask your lactation consultant which type will work best for you. Breast pumps can be purchased, but some hospitals and breastfeeding support groups lease breast pumps on a monthly basis. A lactation consultant can teach you how to hand express breast milk, if you prefer not to use a pump. Caring for your breasts while you breastfeed Nipples can become dry, cracked, and sore while breastfeeding. The following recommendations can help keep your breasts moisturized and healthy:  Avoid using soap on your nipples.  Wear a supportive bra. Although not required, special nursing bras and tank  tops are designed to allow access to your breasts for breastfeeding without taking off your entire bra or top. Avoid wearing underwire-style bras or extremely tight bras.  Air dry your nipples for 3-4minutes after each feeding.  Use only cotton bra pads to absorb leaked breast milk. Leaking of breast milk between feedings is normal.  Use lanolin on your nipples after breastfeeding. Lanolin helps to maintain your skin's normal moisture barrier. If you use pure lanolin, you do not need to wash it off before feeding your baby again. Pure lanolin is not toxic to your baby. You may also hand express a few drops of breast milk and gently massage that milk into your nipples and allow the milk to air dry.  In the first few weeks after giving birth, some women experience extremely full breasts (engorgement). Engorgement can make your breasts feel heavy, warm, and tender to the touch. Engorgement peaks within 3-5 days after you give birth. The following recommendations can help ease engorgement:  Completely empty your breasts while breastfeeding or pumping. You may want to start by applying warm, moist heat (in the shower or with warm water-soaked hand towels) just before feeding or pumping. This increases circulation and helps the milk flow. If your baby does not completely empty your breasts while breastfeeding, pump any extra milk after he or she is finished.  Wear a snug bra (nursing or regular) or tank top for 1-2 days to signal your body to slightly decrease milk production.  Apply ice packs to your breasts, unless this is too uncomfortable for you.  Make sure that your baby is latched on and positioned properly while breastfeeding.  If engorgement persists after 48 hours of following these recommendations, contact your health care provider or a lactation consultant. Overall health care recommendations while breastfeeding  Eat healthy foods. Alternate between meals and snacks, eating 3 of each per  day. Because what you eat affects your breast milk, some of the foods may make your baby more irritable than usual. Avoid eating these foods if you are sure that they are negatively affecting your baby.  Drink milk, fruit juice, and water to satisfy your thirst (about 10 glasses a day).  Rest often, relax, and continue to take your prenatal vitamins to prevent fatigue, stress, and anemia.  Continue breast self-awareness checks.  Avoid chewing and smoking tobacco. Chemicals from cigarettes that pass into breast milk and exposure to secondhand smoke may harm your baby.  Avoid alcohol and drug use, including marijuana. Some medicines that may be harmful to your baby can pass through breast milk. It is important to ask your health care   provider before taking any medicine, including all over-the-counter and prescription medicine as well as vitamin and herbal supplements. It is possible to become pregnant while breastfeeding. If birth control is desired, ask your health care provider about options that will be safe for your baby. Contact a health care provider if:  You feel like you want to stop breastfeeding or have become frustrated with breastfeeding.  You have painful breasts or nipples.  Your nipples are cracked or bleeding.  Your breasts are red, tender, or warm.  You have a swollen area on either breast.  You have a fever or chills.  You have nausea or vomiting.  You have drainage other than breast milk from your nipples.  Your breasts do not become full before feedings by the fifth day after you give birth.  You feel sad and depressed.  Your baby is too sleepy to eat well.  Your baby is having trouble sleeping.  Your baby is wetting less than 3 diapers in a 24-hour period.  Your baby has less than 3 stools in a 24-hour period.  Your baby's skin or the white part of his or her eyes becomes yellow.  Your baby is not gaining weight by 5 days of age. Get help right away  if:  Your baby is overly tired (lethargic) and does not want to wake up and feed.  Your baby develops an unexplained fever. This information is not intended to replace advice given to you by your health care provider. Make sure you discuss any questions you have with your health care provider. Document Released: 05/08/2005 Document Revised: 10/20/2015 Document Reviewed: 10/30/2012 Elsevier Interactive Patient Education  2017 Elsevier Inc.  

## 2017-04-10 ENCOUNTER — Ambulatory Visit: Payer: Self-pay

## 2017-04-10 ENCOUNTER — Ambulatory Visit (INDEPENDENT_AMBULATORY_CARE_PROVIDER_SITE_OTHER): Payer: Medicaid Other | Admitting: *Deleted

## 2017-04-10 VITALS — BP 116/73 | HR 114

## 2017-04-10 DIAGNOSIS — O10919 Unspecified pre-existing hypertension complicating pregnancy, unspecified trimester: Secondary | ICD-10-CM

## 2017-04-10 DIAGNOSIS — O10913 Unspecified pre-existing hypertension complicating pregnancy, third trimester: Secondary | ICD-10-CM

## 2017-04-10 NOTE — Progress Notes (Signed)

## 2017-04-11 ENCOUNTER — Ambulatory Visit (INDEPENDENT_AMBULATORY_CARE_PROVIDER_SITE_OTHER): Payer: Medicaid Other | Admitting: Obstetrics & Gynecology

## 2017-04-11 VITALS — BP 136/87 | HR 113 | Wt 230.8 lb

## 2017-04-11 DIAGNOSIS — O0990 Supervision of high risk pregnancy, unspecified, unspecified trimester: Secondary | ICD-10-CM

## 2017-04-11 DIAGNOSIS — O0993 Supervision of high risk pregnancy, unspecified, third trimester: Secondary | ICD-10-CM

## 2017-04-11 DIAGNOSIS — O10913 Unspecified pre-existing hypertension complicating pregnancy, third trimester: Secondary | ICD-10-CM

## 2017-04-11 DIAGNOSIS — O10919 Unspecified pre-existing hypertension complicating pregnancy, unspecified trimester: Secondary | ICD-10-CM

## 2017-04-11 LAB — POCT URINALYSIS DIP (DEVICE)
Bilirubin Urine: NEGATIVE
Glucose, UA: NEGATIVE mg/dL
HGB URINE DIPSTICK: NEGATIVE
Ketones, ur: NEGATIVE mg/dL
LEUKOCYTES UA: NEGATIVE
Nitrite: NEGATIVE
Protein, ur: NEGATIVE mg/dL
SPECIFIC GRAVITY, URINE: 1.025 (ref 1.005–1.030)
UROBILINOGEN UA: 0.2 mg/dL (ref 0.0–1.0)
pH: 6.5 (ref 5.0–8.0)

## 2017-04-11 NOTE — Progress Notes (Signed)
NST performed today was reviewed and was found to be non-reactive; follow up was BPP 8/10 (-2 for NRNST) with normal AFI.  Continue recommended antenatal testing and prenatal care.

## 2017-04-11 NOTE — Progress Notes (Signed)
   PRENATAL VISIT NOTE  Subjective:  Ann Robbins is a 26 y.o. G3P2002 at 9258w3d being seen today for ongoing prenatal care.  She is currently monitored for the following issues for this high-risk pregnancy and has Essential hypertension; Dental decay; Obesity; Supervision of high risk pregnancy, antepartum; Unwanted fertility; Chronic hypertension during pregnancy, antepartum; and History of cervical dysplasia on their problem list.  Patient reports no complaints.  Contractions: Not present. Vag. Bleeding: None.  Movement: Present. Denies leaking of fluid.   The following portions of the patient's history were reviewed and updated as appropriate: allergies, current medications, past family history, past medical history, past social history, past surgical history and problem list. Problem list updated.  Objective:   Vitals:   04/11/17 1329  BP: 136/87  Pulse: (!) 113  Weight: 230 lb 12.8 oz (104.7 kg)    Fetal Status: Fetal Heart Rate (bpm): 150   Movement: Present     General:  Alert, oriented and cooperative. Patient is in no acute distress.  Skin: Skin is warm and dry. No rash noted.   Cardiovascular: Normal heart rate noted  Respiratory: Normal respiratory effort, no problems with respiration noted  Abdomen: Soft, gravid, appropriate for gestational age.  Pain/Pressure: Absent     Pelvic: Cervical exam deferred        Extremities: Normal range of motion.  Edema: None  Mental Status:  Normal mood and affect. Normal behavior. Normal judgment and thought content.   Assessment and Plan:  Pregnancy: G3P2002 at 6258w3d  1. Chronic hypertension during pregnancy, antepartum Stable BP, will continue to monitor.  NST performed yesterday was reviewed and was found to be non-reactive; follow up was BPP 8/10 (-2 for NRNST) with normal AFI.  Continue recommended antenatal testing and prenatal care.  2. Supervision of high risk pregnancy, antepartum Preterm labor symptoms and general  obstetric precautions including but not limited to vaginal bleeding, contractions, leaking of fluid and fetal movement were reviewed in detail with the patient. Please refer to After Visit Summary for other counseling recommendations.  Return for OB visits and antenatal testing as scheduled.   Jaynie CollinsUgonna Garnett Rekowski, MD

## 2017-04-11 NOTE — Patient Instructions (Signed)
Return to clinic for any scheduled appointments or obstetric concerns, or go to MAU for evaluation  

## 2017-04-18 ENCOUNTER — Ambulatory Visit (HOSPITAL_COMMUNITY)
Admission: RE | Admit: 2017-04-18 | Discharge: 2017-04-18 | Disposition: A | Discharge status: Home / Self Care | Payer: Medicaid Other | Source: Ambulatory Visit | Attending: Obstetrics and Gynecology | Admitting: Obstetrics and Gynecology

## 2017-04-18 ENCOUNTER — Ambulatory Visit (INDEPENDENT_AMBULATORY_CARE_PROVIDER_SITE_OTHER): Payer: Medicaid Other | Admitting: *Deleted

## 2017-04-18 ENCOUNTER — Ambulatory Visit (INDEPENDENT_AMBULATORY_CARE_PROVIDER_SITE_OTHER): Payer: Medicaid Other | Admitting: Obstetrics & Gynecology

## 2017-04-18 VITALS — BP 123/68 | HR 108 | Wt 225.3 lb

## 2017-04-18 DIAGNOSIS — Z6835 Body mass index (BMI) 35.0-35.9, adult: Secondary | ICD-10-CM | POA: Insufficient documentation

## 2017-04-18 DIAGNOSIS — O99213 Obesity complicating pregnancy, third trimester: Secondary | ICD-10-CM | POA: Diagnosis not present

## 2017-04-18 DIAGNOSIS — O10913 Unspecified pre-existing hypertension complicating pregnancy, third trimester: Secondary | ICD-10-CM

## 2017-04-18 DIAGNOSIS — Z3009 Encounter for other general counseling and advice on contraception: Secondary | ICD-10-CM

## 2017-04-18 DIAGNOSIS — Z3A34 34 weeks gestation of pregnancy: Secondary | ICD-10-CM | POA: Diagnosis not present

## 2017-04-18 DIAGNOSIS — O10919 Unspecified pre-existing hypertension complicating pregnancy, unspecified trimester: Secondary | ICD-10-CM

## 2017-04-18 DIAGNOSIS — O0990 Supervision of high risk pregnancy, unspecified, unspecified trimester: Secondary | ICD-10-CM

## 2017-04-18 DIAGNOSIS — O0993 Supervision of high risk pregnancy, unspecified, third trimester: Secondary | ICD-10-CM

## 2017-04-18 DIAGNOSIS — E669 Obesity, unspecified: Secondary | ICD-10-CM | POA: Insufficient documentation

## 2017-04-18 LAB — POCT URINALYSIS DIP (DEVICE)
BILIRUBIN URINE: NEGATIVE
GLUCOSE, UA: NEGATIVE mg/dL
Hgb urine dipstick: NEGATIVE
KETONES UR: NEGATIVE mg/dL
Nitrite: NEGATIVE
PH: 6.5 (ref 5.0–8.0)
Protein, ur: NEGATIVE mg/dL
Specific Gravity, Urine: 1.025 (ref 1.005–1.030)
Urobilinogen, UA: 0.2 mg/dL (ref 0.0–1.0)

## 2017-04-18 NOTE — Progress Notes (Signed)
   PRENATAL VISIT NOTE  Subjective:  Ann Robbins is a 26 y.o. G3P2002 at 324w3d being seen today for ongoing prenatal care.  She is currently monitored for the following issues for this high-risk pregnancy and has Essential hypertension; Dental decay; Obesity; Supervision of high risk pregnancy, antepartum; Unwanted fertility; Chronic hypertension during pregnancy, antepartum; and History of cervical dysplasia on their problem list.  Patient reports no complaints.  Contractions: Irregular. Vag. Bleeding: None.  Movement: Present. Denies leaking of fluid.   The following portions of the patient's history were reviewed and updated as appropriate: allergies, current medications, past family history, past medical history, past social history, past surgical history and problem list. Problem list updated.  Objective:   Vitals:   04/18/17 0755  BP: 123/68  Pulse: (!) 108  Weight: 225 lb 4.8 oz (102.2 kg)    Fetal Status: Fetal Heart Rate (bpm): NST   Movement: Present     General:  Alert, oriented and cooperative. Patient is in no acute distress.  Skin: Skin is warm and dry. No rash noted.   Cardiovascular: Normal heart rate noted  Respiratory: Normal respiratory effort, no problems with respiration noted  Abdomen: Soft, gravid, appropriate for gestational age.  Pain/Pressure: Present     Pelvic: Cervical exam deferred        Extremities: Normal range of motion.  Edema: None  Mental Status:  Normal mood and affect. Normal behavior. Normal judgment and thought content.   Assessment and Plan:  Pregnancy: G3P2002 at 784w3d  1. Supervision of high risk pregnancy, antepartum  2. Chronic hypertension during pregnancy, antepartum Stable off meds No s/sx of preeclampsia  NST reviewed and reactive.  Preterm labor symptoms and general obstetric precautions including but not limited to vaginal bleeding, contractions, leaking of fluid and fetal movement were reviewed in detail with the  patient. Please refer to After Visit Summary for other counseling recommendations.  Return in about 2 weeks (around 05/02/2017) for NST/BPP only;  NST/BPP and HOB in 2 weeks.   Willodean Rosenthalarolyn Harraway-Smith, MD

## 2017-04-18 NOTE — Progress Notes (Signed)
Pt reports having dizzy spells about 3x/wk. The spells last several minutes. Pt has US for growth and BPP today @ 1015

## 2017-04-25 ENCOUNTER — Ambulatory Visit (INDEPENDENT_AMBULATORY_CARE_PROVIDER_SITE_OTHER): Payer: Medicaid Other | Admitting: *Deleted

## 2017-04-25 ENCOUNTER — Inpatient Hospital Stay (HOSPITAL_COMMUNITY)
Admission: AD | Admit: 2017-04-25 | Discharge: 2017-04-25 | Disposition: A | Discharge status: Home / Self Care | Payer: Medicaid Other | Source: Ambulatory Visit | Attending: Family Medicine | Admitting: Family Medicine

## 2017-04-25 ENCOUNTER — Encounter (HOSPITAL_COMMUNITY): Payer: Self-pay

## 2017-04-25 ENCOUNTER — Ambulatory Visit: Payer: Self-pay

## 2017-04-25 VITALS — BP 120/68 | HR 105 | Wt 229.3 lb

## 2017-04-25 DIAGNOSIS — Z3689 Encounter for other specified antenatal screening: Secondary | ICD-10-CM

## 2017-04-25 DIAGNOSIS — O10919 Unspecified pre-existing hypertension complicating pregnancy, unspecified trimester: Secondary | ICD-10-CM

## 2017-04-25 DIAGNOSIS — O10013 Pre-existing essential hypertension complicating pregnancy, third trimester: Secondary | ICD-10-CM | POA: Diagnosis present

## 2017-04-25 DIAGNOSIS — O10913 Unspecified pre-existing hypertension complicating pregnancy, third trimester: Secondary | ICD-10-CM

## 2017-04-25 DIAGNOSIS — Z3A35 35 weeks gestation of pregnancy: Secondary | ICD-10-CM | POA: Insufficient documentation

## 2017-04-25 LAB — URINALYSIS, ROUTINE W REFLEX MICROSCOPIC
Bilirubin Urine: NEGATIVE
GLUCOSE, UA: NEGATIVE mg/dL
Hgb urine dipstick: NEGATIVE
Ketones, ur: NEGATIVE mg/dL
LEUKOCYTES UA: NEGATIVE
Nitrite: NEGATIVE
PH: 6 (ref 5.0–8.0)
Protein, ur: NEGATIVE mg/dL
SPECIFIC GRAVITY, URINE: 1.008 (ref 1.005–1.030)

## 2017-04-25 NOTE — MAU Note (Addendum)
Pt sent from clinic for prolonged monitoring

## 2017-04-25 NOTE — Discharge Instructions (Signed)

## 2017-04-25 NOTE — MAU Provider Note (Signed)
  History     CSN: [id number]  Arrival date and time: 04/25/17 0907   First Provider Initiated Contact with Patient 04/25/17 [phone]      Chief Complaint  Patient presents with  . Non-stress Test   HPI Ann Robbins is a 26 y.o. G3P2002 at 451w3d who presents from the office for fetal monitoring. Patient had BPP/NST today d/t CHTN. BPP 8/8 with normal AFI but fetal tracing was non reactive with late deceleration.  Denies abdominal pain, vaginal bleeding, or LOF. Positive fetal movement.   OB History    Gravida Para Term Preterm AB Living   3 2 2  0 0 2   SAB TAB Ectopic Multiple Live Births   0   0   2      Past Medical History:  Diagnosis Date  . Hypertension 2015   First with PIH and bp remained high after delivery    Past Surgical History:  Procedure Laterality Date  . CHOLECYSTECTOMY  02/2015   Laparoscopic    Family History  Problem Relation Age of Onset  . Hypertension Mother     Social History   Tobacco Use  . Smoking status: Never Smoker  . Smokeless tobacco: Never Used  Substance Use Topics  . Alcohol use: No  . Drug use: No    Allergies: No Known Allergies  No medications prior to admission.    Review of Systems  Constitutional: Negative.   Gastrointestinal: Negative.   Genitourinary: Negative.    Physical Exam   Blood pressure 125/66, pulse (!) 101, temperature 98.3 F (36.8 C), resp. rate 16, last menstrual period 08/20/2016, SpO2 100 %.  Physical Exam  Nursing note and vitals reviewed. Constitutional: She is oriented to person, place, and time. She appears well-developed and well-nourished. No distress.  HENT:  Head: Normocephalic and atraumatic.  Eyes: Conjunctivae are normal. Right eye exhibits no discharge. Left eye exhibits no discharge. No scleral icterus.  Neck: Normal range of motion.  Respiratory: Effort normal. No respiratory distress.  Neurological: She is alert and oriented to person, place, and time.  Skin: She is not  diaphoretic.  Psychiatric: She has a normal mood and affect. Her behavior is normal. Judgment and thought content normal.    MAU Course  Procedures Results for orders placed or performed during the hospital encounter of 04/25/17 (from the past 24 hour(s))  Urinalysis, Routine w reflex microscopic     Status: None   Collection Time: 04/25/17  9:09 AM  Result Value Ref Range   Color, Urine YELLOW YELLOW   APPearance CLEAR CLEAR   Specific Gravity, Urine 1.008 1.005 - 1.030   pH 6.0 5.0 - 8.0   Glucose, UA NEGATIVE NEGATIVE mg/dL   Hgb urine dipstick NEGATIVE NEGATIVE   Bilirubin Urine NEGATIVE NEGATIVE   Ketones, ur NEGATIVE NEGATIVE mg/dL   Protein, ur NEGATIVE NEGATIVE mg/dL   Nitrite NEGATIVE NEGATIVE   Leukocytes, UA NEGATIVE NEGATIVE    MDM NST:  Baseline: 150 bpm, Variability: Good {> 6 bpm), Accelerations: Reactive and Decelerations: Absent Reactive NST Reviewed with Dr. Alvester MorinNewton. Ok to discharge.   Assessment and Plan  A:  1. NST (non-stress test) reactive   2. [redacted] weeks gestation of pregnancy    P: Discharge home Discussed reasons to return to MAU Keep f/u with OB  Judeth HornErin Abrey Bradway 04/25/2017, 11:01 AM

## 2017-04-25 NOTE — Progress Notes (Signed)
Pt informed that the ultrasound is considered a limited OB ultrasound and is not intended to be a complete ultrasound exam.  Patient also informed that the ultrasound is not being completed with the intent of assessing for fetal or placental anomalies or any pelvic abnormalities.  Explained that the purpose of today's ultrasound is to assess for presentation, BPP and amniotic fluid volume.  Patient acknowledges the purpose of the exam and the limitations of the study.    Pt taken to MAU for extended EFM due to FHR decels during NST

## 2017-04-25 NOTE — Progress Notes (Signed)
I reviewed the patient's fetal monitoring.  145/mod/+accels, no decels   A/P: reactive NST with BPP 8/8 this morning. Reassured regarding fetal status. Ok to D/C home and follow up for antenatal testing.   Federico FlakeKimberly Niles Elif Yonts, MD, MPH, ABFM Attending Physician Faculty [facility]

## 2017-04-25 NOTE — MAU Note (Signed)
Urine in lab 

## 2017-04-25 NOTE — Progress Notes (Signed)
Non-reactive NST with minimal variability and concerning decelerations; sent to MAU for prolonged monitoring. BPP 8/10, normal AFV.  Continue recommended antenatal testing and prenatal care.

## 2017-05-02 ENCOUNTER — Encounter: Payer: Self-pay | Admitting: Obstetrics and Gynecology

## 2017-05-02 ENCOUNTER — Ambulatory Visit: Payer: Self-pay

## 2017-05-02 ENCOUNTER — Ambulatory Visit (INDEPENDENT_AMBULATORY_CARE_PROVIDER_SITE_OTHER): Payer: Medicaid Other | Admitting: Obstetrics and Gynecology

## 2017-05-02 ENCOUNTER — Ambulatory Visit: Payer: Medicaid Other | Admitting: *Deleted

## 2017-05-02 ENCOUNTER — Other Ambulatory Visit (HOSPITAL_COMMUNITY)
Admission: RE | Admit: 2017-05-02 | Discharge: 2017-05-02 | Disposition: A | Discharge status: Home / Self Care | Payer: Medicaid Other | Source: Ambulatory Visit | Attending: Obstetrics and Gynecology | Admitting: Obstetrics and Gynecology

## 2017-05-02 VITALS — BP 136/72 | HR 118 | Wt 231.1 lb

## 2017-05-02 DIAGNOSIS — O10919 Unspecified pre-existing hypertension complicating pregnancy, unspecified trimester: Secondary | ICD-10-CM

## 2017-05-02 DIAGNOSIS — O0990 Supervision of high risk pregnancy, unspecified, unspecified trimester: Secondary | ICD-10-CM

## 2017-05-02 DIAGNOSIS — O0993 Supervision of high risk pregnancy, unspecified, third trimester: Secondary | ICD-10-CM

## 2017-05-02 DIAGNOSIS — O10913 Unspecified pre-existing hypertension complicating pregnancy, third trimester: Secondary | ICD-10-CM

## 2017-05-02 LAB — POCT URINALYSIS DIP (DEVICE)
Bilirubin Urine: NEGATIVE
GLUCOSE, UA: NEGATIVE mg/dL
Hgb urine dipstick: NEGATIVE
KETONES UR: NEGATIVE mg/dL
Leukocytes, UA: NEGATIVE
Nitrite: NEGATIVE
PH: 6.5 (ref 5.0–8.0)
PROTEIN: NEGATIVE mg/dL
SPECIFIC GRAVITY, URINE: 1.015 (ref 1.005–1.030)
Urobilinogen, UA: 0.2 mg/dL (ref 0.0–1.0)

## 2017-05-02 LAB — OB RESULTS CONSOLE GBS: GBS: NEGATIVE

## 2017-05-02 NOTE — Progress Notes (Signed)
   PRENATAL VISIT NOTE  Subjective:  Ann Robbins is a 26 y.o. G3P2002 at 8729w3d being seen today for ongoing prenatal care.  She is currently monitored for the following issues for this high-risk pregnancy and has Essential hypertension; Dental decay; Obesity; Supervision of high risk pregnancy, antepartum; Unwanted fertility; Chronic hypertension during pregnancy, antepartum; and History of cervical dysplasia on their problem list.  Patient reports no complaints.  Contractions: Not present. Vag. Bleeding: None.  Movement: Present. Denies leaking of fluid.   The following portions of the patient's history were reviewed and updated as appropriate: allergies, current medications, past family history, past medical history, past social history, past surgical history and problem list. Problem list updated.  Objective:   Vitals:   05/02/17 0800  BP: 136/72  Pulse: (!) 118  Weight: 231 lb 1.6 oz (104.8 kg)    Fetal Status: Fetal Heart Rate (bpm): NST   Movement: Present     General:  Alert, oriented and cooperative. Patient is in no acute distress.  Skin: Skin is warm and dry. No rash noted.   Cardiovascular: Normal heart rate noted  Respiratory: Normal respiratory effort, no problems with respiration noted  Abdomen: Soft, gravid, appropriate for gestational age.  Pain/Pressure: Present     Pelvic: Cervical exam performed        Extremities: Normal range of motion.  Edema: None  Mental Status:  Normal mood and affect. Normal behavior. Normal judgment and thought content.   Assessment and Plan:  Pregnancy: G3P2002 at 2829w3d  1. Supervision of high risk pregnancy, antepartum Patient is doing well without complaints Cultures today  2. Chronic hypertension during pregnancy, antepartum BP well controlled with labetalol Continue ASA until 12/16 Follow up growth ultrasound on 12/26 Plan for IOL at 39 weeks NST reviewed and reactive  Preterm labor symptoms and general obstetric  precautions including but not limited to vaginal bleeding, contractions, leaking of fluid and fetal movement were reviewed in detail with the patient. Please refer to After Visit Summary for other counseling recommendations.  Return in about 7 days (around 05/09/2017) for 12/19 NST/BPP & HOB, 12/26 NST & HOB - has US @ 0815.   Catalina AntiguaPeggy Kennedee Kitzmiller, MD

## 2017-05-02 NOTE — Progress Notes (Signed)

## 2017-05-03 LAB — GC/CHLAMYDIA PROBE AMP (~~LOC~~) NOT AT ARMC
Chlamydia: NEGATIVE
Neisseria Gonorrhea: NEGATIVE

## 2017-05-06 LAB — CULTURE, BETA STREP (GROUP B ONLY): Strep Gp B Culture: NEGATIVE

## 2017-05-09 ENCOUNTER — Ambulatory Visit: Payer: Self-pay

## 2017-05-09 ENCOUNTER — Telehealth (HOSPITAL_COMMUNITY): Payer: Self-pay | Admitting: *Deleted

## 2017-05-09 ENCOUNTER — Ambulatory Visit (INDEPENDENT_AMBULATORY_CARE_PROVIDER_SITE_OTHER): Payer: Medicaid Other | Admitting: *Deleted

## 2017-05-09 ENCOUNTER — Ambulatory Visit (INDEPENDENT_AMBULATORY_CARE_PROVIDER_SITE_OTHER): Payer: Medicaid Other | Admitting: Obstetrics & Gynecology

## 2017-05-09 ENCOUNTER — Encounter (HOSPITAL_COMMUNITY): Payer: Self-pay | Admitting: *Deleted

## 2017-05-09 VITALS — BP 132/66 | HR 138 | Wt 229.4 lb

## 2017-05-09 DIAGNOSIS — O10913 Unspecified pre-existing hypertension complicating pregnancy, third trimester: Secondary | ICD-10-CM | POA: Diagnosis not present

## 2017-05-09 DIAGNOSIS — O0990 Supervision of high risk pregnancy, unspecified, unspecified trimester: Secondary | ICD-10-CM

## 2017-05-09 DIAGNOSIS — O0993 Supervision of high risk pregnancy, unspecified, third trimester: Secondary | ICD-10-CM

## 2017-05-09 DIAGNOSIS — O10919 Unspecified pre-existing hypertension complicating pregnancy, unspecified trimester: Secondary | ICD-10-CM

## 2017-05-09 NOTE — Progress Notes (Signed)

## 2017-05-09 NOTE — Progress Notes (Signed)
   PRENATAL VISIT NOTE  Subjective:  Ann Robbins is a 26 y.o. G3P2002 at 1730w3d being seen today for ongoing prenatal care.  She is currently monitored for the following issues for this high-risk pregnancy and has Essential hypertension; Dental decay; Obesity; Supervision of high risk pregnancy, antepartum; Unwanted fertility; Chronic hypertension during pregnancy, antepartum; and History of cervical dysplasia on their problem list.  Patient reports no complaints.  Contractions: Irregular. Vag. Bleeding: None.  Movement: Present. Denies leaking of fluid.   The following portions of the patient's history were reviewed and updated as appropriate: allergies, current medications, past family history, past medical history, past social history, past surgical history and problem list. Problem list updated.  Objective:   Vitals:   05/09/17 0802  BP: 132/66  Pulse: (!) 138  Weight: 229 lb 6.4 oz (104.1 kg)    Fetal Status: Fetal Heart Rate (bpm): NST Fundal Height: 38 cm Movement: Present     General:  Alert, oriented and cooperative. Patient is in no acute distress.  Skin: Skin is warm and dry. No rash noted.   Cardiovascular: Normal heart rate noted  Respiratory: Normal respiratory effort, no problems with respiration noted  Abdomen: Soft, gravid, appropriate for gestational age.  Pain/Pressure: Present     Pelvic: Cervical exam deferred        Extremities: Normal range of motion.  Edema: None  Mental Status:  Normal mood and affect. Normal behavior. Normal judgment and thought content.   Assessment and Plan:  Pregnancy: G3P2002 at 7930w3d  1. Chronic hypertension during pregnancy, antepartum Stable on Labetalol.  NST performed today was reviewed and was found to be reactive. Subsequent BPP performed today was also reviewed and was found to be 8/10 (NRNST, had one acceleration). AFI was also normal. Continue recommended antenatal testing and prenatal care. IOL to be scheduled at 39 weeks.     2. Supervision of high risk pregnancy, antepartum Term labor symptoms and general obstetric precautions including but not limited to vaginal bleeding, contractions, leaking of fluid and fetal movement were reviewed in detail with the patient. Please refer to After Visit Summary for other counseling recommendations.  Return for OB visits and antenatal testing as scheduled.   Jaynie CollinsUgonna Marge Vandermeulen, MD

## 2017-05-09 NOTE — Telephone Encounter (Signed)
Preadmission screen  

## 2017-05-09 NOTE — Patient Instructions (Signed)
Return to clinic for any scheduled appointments or obstetric concerns, or go to MAU for evaluation  

## 2017-05-16 ENCOUNTER — Ambulatory Visit (INDEPENDENT_AMBULATORY_CARE_PROVIDER_SITE_OTHER): Payer: Medicaid Other | Admitting: Obstetrics & Gynecology

## 2017-05-16 ENCOUNTER — Ambulatory Visit (HOSPITAL_COMMUNITY)
Admission: RE | Admit: 2017-05-16 | Discharge: 2017-05-16 | Disposition: A | Discharge status: Home / Self Care | Payer: Medicaid Other | Source: Ambulatory Visit | Attending: Obstetrics & Gynecology | Admitting: Obstetrics & Gynecology

## 2017-05-16 ENCOUNTER — Ambulatory Visit: Payer: Medicaid Other | Admitting: General Practice

## 2017-05-16 ENCOUNTER — Other Ambulatory Visit (HOSPITAL_COMMUNITY): Payer: Self-pay | Admitting: Advanced Practice Midwife

## 2017-05-16 VITALS — BP 121/66 | HR 113 | Wt 230.5 lb

## 2017-05-16 DIAGNOSIS — O0993 Supervision of high risk pregnancy, unspecified, third trimester: Secondary | ICD-10-CM

## 2017-05-16 DIAGNOSIS — O10013 Pre-existing essential hypertension complicating pregnancy, third trimester: Secondary | ICD-10-CM | POA: Diagnosis not present

## 2017-05-16 DIAGNOSIS — O10919 Unspecified pre-existing hypertension complicating pregnancy, unspecified trimester: Secondary | ICD-10-CM

## 2017-05-16 DIAGNOSIS — Z3A38 38 weeks gestation of pregnancy: Secondary | ICD-10-CM | POA: Diagnosis not present

## 2017-05-16 DIAGNOSIS — O0990 Supervision of high risk pregnancy, unspecified, unspecified trimester: Secondary | ICD-10-CM

## 2017-05-16 DIAGNOSIS — O10913 Unspecified pre-existing hypertension complicating pregnancy, third trimester: Secondary | ICD-10-CM

## 2017-05-16 NOTE — Progress Notes (Signed)
   PRENATAL VISIT NOTE  Subjective:  Ann Robbins is a 26 y.o. G3P2002 at 6935w3d being seen today for ongoing prenatal care.  She is currently monitored for the following issues for this high-risk pregnancy and has Essential hypertension; Dental decay; Obesity; Supervision of high risk pregnancy, antepartum; Unwanted fertility; Chronic hypertension during pregnancy, antepartum; and History of cervical dysplasia on their problem list.  Patient reports no complaints.  Contractions: Not present. Vag. Bleeding: None.  Movement: Present. Denies leaking of fluid.   The following portions of the patient's history were reviewed and updated as appropriate: allergies, current medications, past family history, past medical history, past social history, past surgical history and problem list. Problem list updated.  Objective:   Vitals:   05/16/17 0941  BP: 121/66  Pulse: (!) 113  Weight: 230 lb 8 oz (104.6 kg)    Fetal Status: Fetal Heart Rate (bpm): NST Fundal Height: 39 cm Movement: Present     General:  Alert, oriented and cooperative. Patient is in no acute distress.  Skin: Skin is warm and dry. No rash noted.   Cardiovascular: Normal heart rate noted  Respiratory: Normal respiratory effort, no problems with respiration noted  Abdomen: Soft, gravid, appropriate for gestational age.  Pain/Pressure: Absent     Pelvic: Cervical exam deferred        Extremities: Normal range of motion.  Edema: None  Mental Status:  Normal mood and affect. Normal behavior. Normal judgment and thought content.   Assessment and Plan:  Pregnancy: G3P2002 at 7235w3d  1. Chronic hypertension during pregnancy, antepartum Stable on Labetalol.  NST performed today was reviewed and was found to be reactive. IOL scheduled on 05/20/17.  2. Supervision of high risk pregnancy, antepartum Term labor symptoms and general obstetric precautions including but not limited to vaginal bleeding, contractions, leaking of fluid and  fetal movement were reviewed in detail with the patient. Please refer to After Visit Summary for other counseling recommendations.  Return in about 12 days (around 05/28/2017) for BP check  5 weeks from now: Postpartum check.   Jaynie CollinsUgonna Davaris Youtsey, MD

## 2017-05-16 NOTE — Patient Instructions (Signed)
Return to clinic for any scheduled appointments or obstetric concerns, or go to MAU for evaluation  

## 2017-05-18 ENCOUNTER — Other Ambulatory Visit: Payer: Self-pay | Admitting: Advanced Practice Midwife

## 2017-05-20 ENCOUNTER — Inpatient Hospital Stay (HOSPITAL_COMMUNITY)
Admission: RE | Admit: 2017-05-20 | Discharge: 2017-05-23 | DRG: 798 | Disposition: A | Discharge status: Home / Self Care | Payer: Medicaid Other | Source: Ambulatory Visit | Attending: Obstetrics & Gynecology | Admitting: Obstetrics & Gynecology

## 2017-05-20 ENCOUNTER — Inpatient Hospital Stay (HOSPITAL_COMMUNITY): Payer: Medicaid Other | Admitting: Anesthesiology

## 2017-05-20 ENCOUNTER — Encounter (HOSPITAL_COMMUNITY): Payer: Self-pay

## 2017-05-20 DIAGNOSIS — Z3A39 39 weeks gestation of pregnancy: Secondary | ICD-10-CM | POA: Diagnosis not present

## 2017-05-20 DIAGNOSIS — E669 Obesity, unspecified: Secondary | ICD-10-CM | POA: Diagnosis present

## 2017-05-20 DIAGNOSIS — O9902 Anemia complicating childbirth: Secondary | ICD-10-CM | POA: Diagnosis present

## 2017-05-20 DIAGNOSIS — D649 Anemia, unspecified: Secondary | ICD-10-CM | POA: Diagnosis present

## 2017-05-20 DIAGNOSIS — O10913 Unspecified pre-existing hypertension complicating pregnancy, third trimester: Secondary | ICD-10-CM | POA: Diagnosis present

## 2017-05-20 DIAGNOSIS — O10919 Unspecified pre-existing hypertension complicating pregnancy, unspecified trimester: Secondary | ICD-10-CM | POA: Diagnosis present

## 2017-05-20 DIAGNOSIS — O1002 Pre-existing essential hypertension complicating childbirth: Secondary | ICD-10-CM | POA: Diagnosis present

## 2017-05-20 DIAGNOSIS — O0990 Supervision of high risk pregnancy, unspecified, unspecified trimester: Secondary | ICD-10-CM

## 2017-05-20 DIAGNOSIS — Z302 Encounter for sterilization: Secondary | ICD-10-CM | POA: Diagnosis not present

## 2017-05-20 DIAGNOSIS — O99214 Obesity complicating childbirth: Secondary | ICD-10-CM | POA: Diagnosis present

## 2017-05-20 LAB — CBC
HEMATOCRIT: 29.9 % — AB (ref 36.0–46.0)
HEMATOCRIT: 29.6 % — AB (ref 36.0–46.0)
Hemoglobin: 10.1 g/dL — ABNORMAL LOW (ref 12.0–15.0)
Hemoglobin: 9.8 g/dL — ABNORMAL LOW (ref 12.0–15.0)
MCH: 29.3 pg (ref 26.0–34.0)
MCH: 28.9 pg (ref 26.0–34.0)
MCHC: 33.8 g/dL (ref 30.0–36.0)
MCHC: 33.1 g/dL (ref 30.0–36.0)
MCV: 86.7 fL (ref 78.0–100.0)
MCV: 87.3 fL (ref 78.0–100.0)
Platelets: 276 10*3/uL (ref 150–400)
Platelets: 226 10*3/uL (ref 150–400)
RBC: 3.45 MIL/uL — ABNORMAL LOW (ref 3.87–5.11)
RBC: 3.39 MIL/uL — AB (ref 3.87–5.11)
RDW: 14.3 % (ref 11.5–15.5)
RDW: 14.2 % (ref 11.5–15.5)
WBC: 9.2 10*3/uL (ref 4.0–10.5)
WBC: 11 10*3/uL — AB (ref 4.0–10.5)

## 2017-05-20 LAB — TYPE AND SCREEN
ABO/RH(D): O POS
Antibody Screen: NEGATIVE

## 2017-05-20 LAB — ABO/RH: ABO/RH(D): O POS

## 2017-05-20 MED ORDER — EPHEDRINE 5 MG/ML INJ
10.0000 mg | INTRAVENOUS | Status: DC | PRN
  Filled 2017-05-20: qty 2

## 2017-05-20 MED ORDER — MISOPROSTOL 25 MCG QUARTER TABLET
25.0000 ug | ORAL_TABLET | ORAL | Status: DC | PRN
  Administered 2017-05-20: 25 ug via VAGINAL
  Filled 2017-05-20 (×2): qty 1

## 2017-05-20 MED ORDER — OXYTOCIN 40 UNITS IN LACTATED RINGERS INFUSION - SIMPLE MED
1.0000 m[IU]/min | INTRAVENOUS | Status: DC
  Filled 2017-05-20: qty 1000

## 2017-05-20 MED ORDER — LABETALOL HCL 100 MG PO TABS
100.0000 mg | ORAL_TABLET | Freq: Two times a day (BID) | ORAL | Status: DC
  Administered 2017-05-20: 100 mg via ORAL
  Filled 2017-05-20: qty 1

## 2017-05-20 MED ORDER — SOD CITRATE-CITRIC ACID 500-334 MG/5ML PO SOLN: 30.0000 mL | ORAL | Status: DC | PRN

## 2017-05-20 MED ORDER — LACTATED RINGERS IV SOLN: INTRAVENOUS | Status: DC

## 2017-05-20 MED ORDER — LIDOCAINE HCL (PF) 1 % IJ SOLN
INTRAMUSCULAR | Status: DC | PRN
  Administered 2017-05-20 (×2): 4 mL via EPIDURAL

## 2017-05-20 MED ORDER — OXYTOCIN 40 UNITS IN LACTATED RINGERS INFUSION - SIMPLE MED
2.5000 [IU]/h | INTRAVENOUS | Status: DC
  Administered 2017-05-21: 2.5 [IU]/h via INTRAVENOUS

## 2017-05-20 MED ORDER — LACTATED RINGERS IV SOLN
500.0000 mL | INTRAVENOUS | Status: DC | PRN
  Administered 2017-05-20 (×2): 500 mL via INTRAVENOUS

## 2017-05-20 MED ORDER — FENTANYL CITRATE (PF) 100 MCG/2ML IJ SOLN: 50.0000 ug | INTRAMUSCULAR | Status: DC | PRN

## 2017-05-20 MED ORDER — TERBUTALINE SULFATE 1 MG/ML IJ SOLN
0.2500 mg | Freq: Once | INTRAMUSCULAR | Status: DC | PRN
  Filled 2017-05-20: qty 1

## 2017-05-20 MED ORDER — OXYTOCIN BOLUS FROM INFUSION: 500.0000 mL | Freq: Once | INTRAVENOUS | Status: DC

## 2017-05-20 MED ORDER — ONDANSETRON HCL 4 MG/2ML IJ SOLN: 4.0000 mg | Freq: Four times a day (QID) | INTRAMUSCULAR | Status: DC | PRN

## 2017-05-20 MED ORDER — FENTANYL CITRATE (PF) 100 MCG/2ML IJ SOLN: 100.0000 ug | INTRAMUSCULAR | Status: DC | PRN

## 2017-05-20 MED ORDER — ACETAMINOPHEN 325 MG PO TABS: 650.0000 mg | ORAL_TABLET | ORAL | Status: DC | PRN

## 2017-05-20 MED ORDER — OXYCODONE-ACETAMINOPHEN 5-325 MG PO TABS: 2.0000 | ORAL_TABLET | ORAL | Status: DC | PRN

## 2017-05-20 MED ORDER — FLEET ENEMA 7-19 GM/118ML RE ENEM: 1.0000 | ENEMA | RECTAL | Status: DC | PRN

## 2017-05-20 MED ORDER — LACTATED RINGERS IV SOLN
INTRAVENOUS | Status: DC
  Administered 2017-05-20 (×3): via INTRAVENOUS

## 2017-05-20 MED ORDER — PHENYLEPHRINE 40 MCG/ML (10ML) SYRINGE FOR IV PUSH (FOR BLOOD PRESSURE SUPPORT)
80.0000 ug | PREFILLED_SYRINGE | INTRAVENOUS | Status: DC | PRN
  Filled 2017-05-20: qty 10
  Filled 2017-05-20: qty 5

## 2017-05-20 MED ORDER — FENTANYL 2.5 MCG/ML BUPIVACAINE 1/10 % EPIDURAL INFUSION (WH - ANES)
14.0000 mL/h | INTRAMUSCULAR | Status: DC | PRN
  Administered 2017-05-20: 14 mL/h via EPIDURAL
  Filled 2017-05-20: qty 100

## 2017-05-20 MED ORDER — DIPHENHYDRAMINE HCL 50 MG/ML IJ SOLN: 12.5000 mg | INTRAMUSCULAR | Status: DC | PRN

## 2017-05-20 MED ORDER — LIDOCAINE HCL (PF) 1 % IJ SOLN
30.0000 mL | INTRAMUSCULAR | Status: DC | PRN
Start: 2017-05-20 — End: 2017-05-21
  Filled 2017-05-20: qty 30

## 2017-05-20 MED ORDER — PHENYLEPHRINE 40 MCG/ML (10ML) SYRINGE FOR IV PUSH (FOR BLOOD PRESSURE SUPPORT)
80.0000 ug | PREFILLED_SYRINGE | INTRAVENOUS | Status: DC | PRN
  Administered 2017-05-20 (×2): 80 ug via INTRAVENOUS
  Filled 2017-05-20: qty 5

## 2017-05-20 MED ORDER — OXYCODONE-ACETAMINOPHEN 5-325 MG PO TABS: 1.0000 | ORAL_TABLET | ORAL | Status: DC | PRN

## 2017-05-20 MED ORDER — LACTATED RINGERS IV SOLN
500.0000 mL | Freq: Once | INTRAVENOUS | Status: AC
  Administered 2017-05-20: 500 mL via INTRAVENOUS

## 2017-05-20 NOTE — Anesthesia Procedure Notes (Signed)
Epidural Patient location during procedure: OB Start time: 05/20/2017 9:16 PM End time: 05/20/2017 9:26 PM  Staffing Anesthesiologist: Leonides GrillsEllender, Emilina Smarr P, MD Performed: anesthesiologist   Preanesthetic Checklist Completed: patient identified, site marked, pre-op evaluation, timeout performed, IV checked, risks and benefits discussed and monitors and equipment checked  Epidural Patient position: sitting Prep: DuraPrep Patient monitoring: heart rate, cardiac monitor, continuous pulse ox and blood pressure Approach: midline Location: L4-L5 Injection technique: LOR air  Needle:  Needle type: Tuohy  Needle gauge: 17 G Needle length: 9 cm Needle insertion depth: 6 cm Catheter type: closed end flexible Catheter size: 19 Gauge Catheter at skin depth: 11 cm Test dose: negative and Other  Assessment Events: blood not aspirated, injection not painful, no injection resistance and negative IV test  Additional Notes Informed consent obtained prior to proceeding including risk of failure, 1% risk of PDPH, risk of minor discomfort and bruising. Discussed alternatives to epidural analgesia and patient desires to proceed.  Timeout performed pre-procedure verifying patient name, procedure, and platelet count.  Patient tolerated procedure well. Reason for block:procedure for pain

## 2017-05-20 NOTE — Anesthesia Pain Management Evaluation Note (Signed)
  CRNA Pain Management Visit Note  Patient: Ann Robbins, 26 y.o., female  "[facility] We have an anesthesia team available at all times to provide care throughout the hospital, including epidural management and anesthesia for C-section. I don't know your plan for the delivery whether it a natural birth, water birth, IV sedation, nitrous supplementation, doula or epidural, but we want to meet your pain goals."   1.Was your pain managed to your expectations on prior hospitalizations?   Yes   2.What is your expectation for pain management during this hospitalization?     Epidural  3.How can we help you reach that goal? epidural  Record the patient's initial score and the patient's pain goal.   Pain: 4  Pain Goal: 7 [facility] wants you to be able to say your pain was always managed very well.  Rica RecordsICKELTON,Kaidan Harpster 05/20/2017

## 2017-05-20 NOTE — Progress Notes (Signed)
Ann Robbins is a 26 y.o. G3P2002 at 3066w0d by ultrasound admitted for induction of labor due to Hypertension.  Subjective:   Objective: BP 124/66   Pulse (!) 123   Temp 98 F (36.7 C) (Oral)   Resp 18   Ht 5\' 7"  (1.702 m)   Wt 230 lb 3 oz (104.4 kg)   LMP 08/20/2016   BMI 36.05 kg/m  No intake/output data recorded. No intake/output data recorded.  FHT:  FHR: 150's bpm, variability: moderate,  accelerations:  Present,  decelerations:  Absent UC:   none SVE:   Dilation: 3 Effacement (%): 50 Station: -3 Exam by:: Ann Robbins RNC   Labs: Lab Results  Component Value Date   WBC 9.2 05/20/2017   HGB 10.1 (L) 05/20/2017   HCT 29.9 (L) 05/20/2017   MCV 86.7 05/20/2017   PLT 276 05/20/2017    Assessment / Plan: [facility],  progressing well on pitocin  Labor: yet to be in labor Preeclampsia:  no signs or symptoms of toxicity Fetal Wellbeing:  Category I Pain Control:  Labor support without medications I/D:  n/a Anticipated MOD:  NSVD  Ann Robbins 05/20/2017, 12:43 PM

## 2017-05-20 NOTE — H&P (Signed)
Ann Robbins is a 26 y.o. female G3P2002 @ 39 wks presenting for IOL for CHTN . OB History    Gravida Para Term Preterm AB Living   3 2 2  0 0 2   SAB TAB Ectopic Multiple Live Births   0   0   2     Past Medical History:  Diagnosis Date  . Hypertension 2015   First with PIH and bp remained high after delivery   Past Surgical History:  Procedure Laterality Date  . CHOLECYSTECTOMY  02/2015   Laparoscopic   Family History: family history includes Hypertension in her mother. Social History:  reports that  has never smoked. she has never used smokeless tobacco. She reports that she does not drink alcohol or use drugs.     Maternal Diabetes: No Genetic Screening: Normal Maternal Ultrasounds/Referrals: Normal Fetal Ultrasounds or other Referrals:  None Maternal Substance Abuse:  No Significant Maternal Medications:  None Significant Maternal Lab Results:  None Other Comments:  None  Review of Systems  Constitutional: Negative.   HENT: Negative.   Eyes: Negative.   Respiratory: Negative.   Cardiovascular: Negative.   Gastrointestinal: Negative.   Genitourinary: Negative.   Musculoskeletal: Negative.   Skin: Negative.   Neurological: Negative.   Endo/Heme/Allergies: Negative.   Psychiatric/Behavioral: Negative.    Maternal Medical History:  Fetal activity: Perceived fetal activity is normal.   Last perceived fetal movement was within the past hour.    Prenatal complications: PIH.   Prenatal Complications - Diabetes: none.    Dilation: 3 Effacement (%): 50 Station: -3 Exam by:: Belenda CruiseHeather Mitchell RNC  Blood pressure 126/66, pulse 80, temperature 98 F (36.7 C), temperature source Oral, resp. rate 18, height 5\' 7"  (1.702 m), weight 230 lb 3 oz (104.4 kg), last menstrual period 08/20/2016. Maternal Exam:  Uterine Assessment: none  Abdomen: Patient reports no abdominal tenderness. Fetal presentation: vertex  Introitus: Normal vulva. Normal vagina.  Ferning test:  not done.  Nitrazine test: not done. Amniotic fluid character: not assessed.  Pelvis: adequate for delivery.   Cervix: Cervix evaluated by digital exam.     Fetal Exam Fetal Monitor Review: Mode: ultrasound.   Baseline rate: 150.  Variability: moderate (6-25 bpm).   Pattern: accelerations present.    Fetal State Assessment: Category I - tracings are normal.     Physical Exam  Constitutional: She is oriented to person, place, and time. She appears well-developed and well-nourished.  HENT:  Head: Normocephalic.  Eyes: Pupils are equal, round, and reactive to light.  Neck: Normal range of motion.  Cardiovascular: Normal rate, regular rhythm, normal heart sounds and intact distal pulses.  Respiratory: Effort normal and breath sounds normal.  GI: Soft. Bowel sounds are normal.  Genitourinary: Vagina normal and uterus normal.  Musculoskeletal: Normal range of motion.  Neurological: She is alert and oriented to person, place, and time. She has normal reflexes.  Skin: Skin is warm and dry.  Psychiatric: She has a normal mood and affect. Her behavior is normal. Judgment and thought content normal.    Prenatal labs: ABO, Rh: --/--/O POS (12/30 [id number]) Antibody: PENDING (12/30 0850) Rubella: Immune (06/21 0000) RPR: Non Reactive (10/04 0924)  HBsAg: Negative (06/21 0000)  HIV: Non Reactive (10/04 0924)  GBS: Negative (12/12 0000)   Assessment/Plan: SVE 3/th/post/high  FHR patter reassuring IOL for CHTN GBS neg  Wyvonnia DuskyMarie Lawson 05/20/2017, 10:30 AM

## 2017-05-20 NOTE — Progress Notes (Signed)
FHT with recurrent variable decel, some late Position changes attempted.  Pitocin discontinued Will start amnioinfusion to help with variable decel.  Continue to monitor closely.   FHT Cat II  Kandra NicolasJulie P. Degele, MD OB Fellow

## 2017-05-20 NOTE — Progress Notes (Signed)
Ann CorollaJaerika Robbins is a 26 y.o. G3P2002 at 3679w0d by ultrasound admitted for induction of labor due to Hypertension.  Subjective:   Objective: BP 132/83   Pulse (!) 101   Temp 98.5 F (36.9 C) (Oral)   Resp 18   Ht 5\' 7"  (1.702 m)   Wt 230 lb 3 oz (104.4 kg)   LMP 08/20/2016   BMI 36.05 kg/m  No intake/output data recorded. No intake/output data recorded.  FHT:  150, no decles, no accels. Strip reviewed by Dr. Dolan AmenHarroway Smith UC:   regular, every 2 minutes SVE:   Dilation: 3 Effacement (%): 70 Station: -1 Exam by:: D Lawson  Labs: Lab Results  Component Value Date   WBC 9.2 05/20/2017   HGB 10.1 (L) 05/20/2017   HCT 29.9 (L) 05/20/2017   MCV 86.7 05/20/2017   PLT 276 05/20/2017    Assessment / Plan: Induction of labor due to [facility] conditions,  progressing well on pitocin  Labor: Progressing normally Preeclampsia:  no signs or symptoms of toxicity Fetal Wellbeing:  Category I Pain Control:  Labor support without medications I/D:  n/a Anticipated MOD:  NSVD  Wyvonnia DuskyMarie Lawson 05/20/2017, 5:49 PM

## 2017-05-20 NOTE — Progress Notes (Signed)
LABOR PROGRESS NOTE  Ann Robbins is a 26 y.o. G3P2002 at 9282w0d  admitted for IOL for CHTN  Subjective: Patient feeling strong contractions. Would like epidural  Objective: BP 126/86   Pulse (!) 119   Temp 98.8 F (37.1 C) (Axillary)   Resp 20   Ht 5\' 7"  (1.702 m)   Wt 230 lb 3 oz (104.4 kg)   LMP 08/20/2016   BMI 36.05 kg/m  or  Vitals:   05/20/17 1922 05/20/17 1930 05/20/17 2001 05/20/17 2031  BP:  132/72 128/66 126/86  Pulse:  (!) 110 (!) 106 (!) 119  Resp:  17 18 20   Temp: 98.8 F (37.1 C)     TempSrc: Axillary     Weight:      Height:        Last SVE around 20:00 Dilation: 4 Effacement (%): 70 Cervical Position: Middle Station: -2 Presentation: Vertex Exam by:: E Poore RN  FHT: baseline rate 150, moderate varibility, +acel, variable decel Toco: MVUs ~150  Assessment / Plan: 26 y.o. G3P2002 at 6982w0d here for IOL for CHTN  Labor: Continue to titrate Pitocin to adequate MVUs as tolerated Fetal Wellbeing:  Cat II  Pain Control:  Pt will get epidural Anticipated MOD:  SVD  Frederik PearJulie P Degele, MD 05/20/2017, 9:09 PM

## 2017-05-20 NOTE — Anesthesia Preprocedure Evaluation (Signed)
Anesthesia Evaluation  Patient identified by MRN, date of birth, ID band Patient awake    Reviewed: Allergy & Precautions, H&P , NPO status , Patient's Chart, lab work & pertinent test results  History of Anesthesia Complications Negative for: history of anesthetic complications  Airway Mallampati: II  TM Distance: >3 FB Neck ROM: full    Dental no notable dental hx. (+) Teeth Intact   Pulmonary neg pulmonary ROS,    Pulmonary exam normal breath sounds clear to auscultation       Cardiovascular hypertension, Normal cardiovascular exam Rhythm:regular Rate:Normal     Neuro/Psych negative neurological ROS  negative psych ROS   GI/Hepatic negative GI ROS, Neg liver ROS,   Endo/Other  negative endocrine ROS  Renal/GU negative Renal ROS  negative genitourinary   Musculoskeletal   Abdominal (+) + obese,   Peds  Hematology  (+) anemia ,   Anesthesia Other Findings   Reproductive/Obstetrics (+) Pregnancy                             Anesthesia Physical Anesthesia Plan  ASA: II  Anesthesia Plan: Epidural   Post-op Pain Management:    Induction:   PONV Risk Score and Plan:   Airway Management Planned:   Additional Equipment:   Intra-op Plan:   Post-operative Plan:   Informed Consent: I have reviewed the patients History and Physical, chart, labs and discussed the procedure including the risks, benefits and alternatives for the proposed anesthesia with the patient or authorized representative who has indicated his/her understanding and acceptance.     Plan Discussed with:   Anesthesia Plan Comments:         Anesthesia Quick Evaluation

## 2017-05-21 ENCOUNTER — Encounter (HOSPITAL_COMMUNITY)
Admission: RE | Disposition: A | Discharge status: Home / Self Care | Payer: Self-pay | Source: Ambulatory Visit | Attending: Obstetrics & Gynecology

## 2017-05-21 ENCOUNTER — Inpatient Hospital Stay (HOSPITAL_COMMUNITY): Payer: Medicaid Other | Admitting: Anesthesiology

## 2017-05-21 ENCOUNTER — Encounter (HOSPITAL_COMMUNITY): Payer: Self-pay

## 2017-05-21 DIAGNOSIS — Z3A39 39 weeks gestation of pregnancy: Secondary | ICD-10-CM

## 2017-05-21 DIAGNOSIS — Z302 Encounter for sterilization: Secondary | ICD-10-CM

## 2017-05-21 DIAGNOSIS — O1002 Pre-existing essential hypertension complicating childbirth: Secondary | ICD-10-CM

## 2017-05-21 HISTORY — PX: TUBAL LIGATION: SHX77

## 2017-05-21 LAB — RPR: RPR: NONREACTIVE

## 2017-05-21 SURGERY — LIGATION, FALLOPIAN TUBE, POSTPARTUM
Anesthesia: Epidural | Site: Abdomen | Laterality: Bilateral

## 2017-05-21 MED ORDER — BUPIVACAINE HCL (PF) 0.5 % IJ SOLN
INTRAMUSCULAR | Status: DC | PRN
  Administered 2017-05-21: 7 mL

## 2017-05-21 MED ORDER — FENTANYL CITRATE (PF) 100 MCG/2ML IJ SOLN: 25.0000 ug | INTRAMUSCULAR | Status: DC | PRN

## 2017-05-21 MED ORDER — WITCH HAZEL-GLYCERIN EX PADS: 1.0000 "application " | MEDICATED_PAD | CUTANEOUS | Status: DC | PRN

## 2017-05-21 MED ORDER — 0.9 % SODIUM CHLORIDE (POUR BTL) OPTIME
TOPICAL | Status: DC | PRN
  Administered 2017-05-21: 1000 mL

## 2017-05-21 MED ORDER — LACTATED RINGERS IV SOLN
INTRAVENOUS | Status: DC
  Administered 2017-05-21 (×2): via INTRAVENOUS

## 2017-05-21 MED ORDER — LABETALOL HCL 100 MG PO TABS: 100.0000 mg | ORAL_TABLET | Freq: Two times a day (BID) | ORAL | Status: DC

## 2017-05-21 MED ORDER — DIPHENHYDRAMINE HCL 25 MG PO CAPS: 25.0000 mg | ORAL_CAPSULE | Freq: Four times a day (QID) | ORAL | Status: DC | PRN

## 2017-05-21 MED ORDER — FAMOTIDINE 20 MG PO TABS
40.0000 mg | ORAL_TABLET | Freq: Once | ORAL | Status: AC
  Administered 2017-05-21: 40 mg via ORAL
  Filled 2017-05-21: qty 2

## 2017-05-21 MED ORDER — METOCLOPRAMIDE HCL 10 MG PO TABS
10.0000 mg | ORAL_TABLET | Freq: Once | ORAL | Status: AC
  Administered 2017-05-21: 10 mg via ORAL
  Filled 2017-05-21: qty 1

## 2017-05-21 MED ORDER — ONDANSETRON HCL 4 MG PO TABS: 4.0000 mg | ORAL_TABLET | ORAL | Status: DC | PRN

## 2017-05-21 MED ORDER — IBUPROFEN 600 MG PO TABS
600.0000 mg | ORAL_TABLET | Freq: Four times a day (QID) | ORAL | Status: DC
  Administered 2017-05-21 – 2017-05-23 (×8): 600 mg via ORAL
  Filled 2017-05-21 (×9): qty 1

## 2017-05-21 MED ORDER — FENTANYL CITRATE (PF) 100 MCG/2ML IJ SOLN
INTRAMUSCULAR | Status: DC | PRN
  Administered 2017-05-21: 100 ug via INTRAVENOUS

## 2017-05-21 MED ORDER — DEXAMETHASONE SODIUM PHOSPHATE 4 MG/ML IJ SOLN
INTRAMUSCULAR | Status: DC | PRN
  Administered 2017-05-21: 4 mg via INTRAVENOUS

## 2017-05-21 MED ORDER — SODIUM BICARBONATE 8.4 % IV SOLN
INTRAVENOUS | Status: DC | PRN
  Administered 2017-05-21 (×3): 5 mL via EPIDURAL

## 2017-05-21 MED ORDER — TETANUS-DIPHTH-ACELL PERTUSSIS 5-2.5-18.5 LF-MCG/0.5 IM SUSP: 0.5000 mL | Freq: Once | INTRAMUSCULAR | Status: DC

## 2017-05-21 MED ORDER — ONDANSETRON HCL 4 MG/2ML IJ SOLN
INTRAMUSCULAR | Status: DC | PRN
  Administered 2017-05-21: 4 mg via INTRAVENOUS

## 2017-05-21 MED ORDER — SENNOSIDES-DOCUSATE SODIUM 8.6-50 MG PO TABS
2.0000 | ORAL_TABLET | ORAL | Status: DC
  Administered 2017-05-22 (×2): 2 via ORAL
  Filled 2017-05-21 (×3): qty 2

## 2017-05-21 MED ORDER — AMLODIPINE BESYLATE 10 MG PO TABS
10.0000 mg | ORAL_TABLET | Freq: Every day | ORAL | Status: DC
  Administered 2017-05-21 – 2017-05-23 (×3): 10 mg via ORAL
  Filled 2017-05-21 (×4): qty 1

## 2017-05-21 MED ORDER — KETOROLAC TROMETHAMINE 30 MG/ML IJ SOLN
INTRAMUSCULAR | Status: DC | PRN
  Administered 2017-05-21: 30 mg via INTRAVENOUS

## 2017-05-21 MED ORDER — DIBUCAINE 1 % RE OINT
1.0000 | TOPICAL_OINTMENT | RECTAL | Status: DC | PRN
Start: 2017-05-21 — End: 2017-05-23

## 2017-05-21 MED ORDER — ACETAMINOPHEN 325 MG PO TABS: 650.0000 mg | ORAL_TABLET | ORAL | Status: DC | PRN

## 2017-05-21 MED ORDER — PRENATAL MULTIVITAMIN CH
1.0000 | ORAL_TABLET | Freq: Every day | ORAL | Status: DC
  Administered 2017-05-22: 1 via ORAL
  Filled 2017-05-21 (×2): qty 1

## 2017-05-21 MED ORDER — MIDAZOLAM HCL 5 MG/5ML IJ SOLN: INTRAMUSCULAR | Status: DC | PRN

## 2017-05-21 MED ORDER — SIMETHICONE 80 MG PO CHEW: 80.0000 mg | CHEWABLE_TABLET | ORAL | Status: DC | PRN

## 2017-05-21 MED ORDER — MIDAZOLAM HCL 5 MG/5ML IJ SOLN
INTRAMUSCULAR | Status: DC | PRN
  Administered 2017-05-21: 2 mg via INTRAVENOUS

## 2017-05-21 MED ORDER — BENZOCAINE-MENTHOL 20-0.5 % EX AERO: 1.0000 "application " | INHALATION_SPRAY | CUTANEOUS | Status: DC | PRN

## 2017-05-21 MED ORDER — LACTATED RINGERS IV SOLN
INTRAVENOUS | Status: DC | PRN
  Administered 2017-05-21: 14:00:00 via INTRAVENOUS

## 2017-05-21 MED ORDER — ONDANSETRON HCL 4 MG/2ML IJ SOLN: 4.0000 mg | INTRAMUSCULAR | Status: DC | PRN

## 2017-05-21 MED ORDER — ZOLPIDEM TARTRATE 5 MG PO TABS: 5.0000 mg | ORAL_TABLET | Freq: Every evening | ORAL | Status: DC | PRN

## 2017-05-21 MED ORDER — COCONUT OIL OIL: 1.0000 "application " | TOPICAL_OIL | Status: DC | PRN

## 2017-05-21 SURGICAL SUPPLY — 24 items
CHLORAPREP W/TINT 26ML (MISCELLANEOUS) ×3 IMPLANT
CLIP FILSHIE TUBAL LIGA STRL (Clip) ×3 IMPLANT
CLOTH BEACON ORANGE TIMEOUT ST (SAFETY) ×3 IMPLANT
CONTAINER PREFILL 10% NBF 15ML (MISCELLANEOUS) ×6 IMPLANT
DRSG OPSITE POSTOP 3X4 (GAUZE/BANDAGES/DRESSINGS) ×3 IMPLANT
GLOVE BIO SURGEON STRL SZ 6.5 (GLOVE) ×2 IMPLANT
GLOVE BIO SURGEONS STRL SZ 6.5 (GLOVE) ×1
GLOVE BIOGEL PI IND STRL 6.5 (GLOVE) ×1 IMPLANT
GLOVE BIOGEL PI IND STRL 7.0 (GLOVE) ×1 IMPLANT
GLOVE BIOGEL PI INDICATOR 6.5 (GLOVE) ×2
GLOVE BIOGEL PI INDICATOR 7.0 (GLOVE) ×2
GOWN STRL REUS W/TWL LRG LVL3 (GOWN DISPOSABLE) ×6 IMPLANT
NEEDLE HYPO 22GX1.5 SAFETY (NEEDLE) ×3 IMPLANT
NS IRRIG 1000ML POUR BTL (IV SOLUTION) ×3 IMPLANT
PACK ABDOMINAL MINOR (CUSTOM PROCEDURE TRAY) ×3 IMPLANT
SPONGE LAP 4X18 X RAY DECT (DISPOSABLE) ×3 IMPLANT
SUT MON AB 4-0 PS1 27 (SUTURE) ×3 IMPLANT
SUT PLAIN 0 NONE (SUTURE) ×3 IMPLANT
SUT PLAIN 2 0 (SUTURE) ×2
SUT PLAIN ABS 2-0 CT1 27XMFL (SUTURE) ×1 IMPLANT
SUT VICRYL 0 UR6 27IN ABS (SUTURE) ×3 IMPLANT
SYR CONTROL 10ML LL (SYRINGE) ×3 IMPLANT
TOWEL OR 17X24 6PK STRL BLUE (TOWEL DISPOSABLE) ×6 IMPLANT
TRAY FOLEY CATH SILVER 14FR (SET/KITS/TRAYS/PACK) ×3 IMPLANT

## 2017-05-21 NOTE — Anesthesia Postprocedure Evaluation (Signed)
Anesthesia Post Note  Patient: Ann Robbins  Procedure(s) Performed: AN AD HOC LABOR EPIDURAL     Patient location during evaluation: Mother Baby Anesthesia Type: Epidural Level of consciousness: awake and alert Pain management: pain level controlled Vital Signs Assessment: post-procedure vital signs reviewed and stable Respiratory status: spontaneous breathing, nonlabored ventilation and respiratory function stable Cardiovascular status: stable Postop Assessment: no headache, no backache and epidural receding Anesthetic complications: no    Last Vitals:  Vitals:   05/21/17 0245 05/21/17 0341  BP: 136/77 121/63  Pulse: (!) 134 (!) 123  Resp: 18 16  Temp: 37.2 C 36.9 C  SpO2: 100%     Last Pain:  Vitals:   05/21/17 0521  TempSrc:   PainSc: Asleep   Pain Goal: Patients Stated Pain Goal: 7 (05/20/17 [id number])               Marrion CoyMERRITT,Chassity Ludke

## 2017-05-21 NOTE — Progress Notes (Signed)
26 y.o. [id number] s/p vaginal delivery with undesired fertility desires permanent sterilization. Risks and benefits of postpartum tubal sterilization procedure was discussed with the patient including permanence of method, bleeding, infection, injury to surrounding organs, use of Filshie clips,anesthesia and need for additional procedures. Risk failure of 0.5-1% with increased risk of ectopic gestation if pregnancy occurs was also discussed with patient. Patient verbalized understanding and all questions were answered.  Currie ParisJames G. Debroah LoopArnold MD 05/21/2017  Patient ID: Ann Robbins, female   DOB: 10/02/1990, 26 y.o.   MRN: [id number]

## 2017-05-21 NOTE — Progress Notes (Signed)
POSTPARTUM PROGRESS NOTE  Post Partum Day 1 Subjective:  Ann Robbins is a 26 y.o. G3P3003 2343w1d s/p SVD.  No acute events overnight.  Pt denies problems with ambulating, voiding or po intake.  She denies nausea or vomiting.  Pain is well controlled. Lochia Minimal.   Objective: Blood pressure (!) 143/85, pulse 91, temperature 97.9 F (36.6 C), temperature source Oral, resp. rate 20, height 5\' 7"  (1.702 m), weight 198 lb 12.8 oz (90.2 kg), last menstrual period 08/20/2016, SpO2 99 %, unknown if currently breastfeeding.  Physical Exam:  General: alert, cooperative and no distress Lochia:normal flow Chest: no respiratory distress Heart:regular rate, distal pulses intact Abdomen: soft, nontender,  Uterine Fundus: firm, appropriately tender DVT Evaluation: No calf swelling or tenderness Extremities: no edema  Recent Labs    05/20/17 0850 05/20/17 2023  HGB 10.1* 9.8*  HCT 29.9* 29.6*    Assessment/Plan:  ASSESSMENT: Ann Robbins is a 26 y.o. G3P3003 9043w1d s/p SVD. POD#1 s/p BTL.  Plan for discharge tomorrow   LOS: 2 days   Yarelin Reichardt MossDO 05/22/2017, 7:11 AM

## 2017-05-21 NOTE — Anesthesia Postprocedure Evaluation (Signed)
Anesthesia Post Note  Patient: Ann Robbins  Procedure(s) Performed: POST PARTUM TUBAL LIGATION (Bilateral Abdomen)     Patient location during evaluation: PACU Anesthesia Type: Epidural Level of consciousness: awake and alert Pain management: pain level controlled Vital Signs Assessment: post-procedure vital signs reviewed and stable Respiratory status: spontaneous breathing and respiratory function stable Cardiovascular status: blood pressure returned to baseline and stable Postop Assessment: epidural receding Anesthetic complications: no    Last Vitals:  Vitals:   05/21/17 1724 05/21/17 2120  BP: 122/60 132/65  Pulse: (!) 115 (!) 109  Resp: 20 18  Temp: 37.2 C 36.5 C  SpO2:      Last Pain:  Vitals:   05/21/17 2120  TempSrc: Oral  PainSc: 0-No pain   Pain Goal: Patients Stated Pain Goal: 3 (05/21/17 1615)               Kennieth RadFitzgerald, [name] E

## 2017-05-21 NOTE — Op Note (Addendum)
Ann CorollaJaerika Robbins 05/20/2017 - 05/21/2017  PREOPERATIVE DIAGNOSIS:  Multiparity, undesired fertility  POSTOPERATIVE DIAGNOSIS:  Multiparity, undesired fertility  PROCEDURE:  Postpartum Bilateral Tubal Sterilization using Filshie Clips   ANESTHESIA:  Epidural  COMPLICATIONS:  None immediate.  ESTIMATED BLOOD LOSS:  Less than 20 ml.  FLUIDS: 1000 ml LR.  URINE OUTPUT:  50 ml of clear urine.  INDICATIONS: 26 y.o. [id number]  with undesired fertility,status post vaginal delivery, desires permanent sterilization. Risks and benefits of procedure discussed with patient including permanence of method, bleeding, infection, injury to surrounding organs and need for additional procedures. Risk failure of 0.5-1% with increased risk of ectopic gestation if pregnancy occurs was also discussed with patient.   FINDINGS:  Normal uterus, tubes, and ovaries.  TECHNIQUE:  The patient was taken to the operating room where her epidural anesthesia was dosed up to surgical level and found to be adequate.  She was then placed in the dorsal supine position and prepped and draped in sterile fashion.  After an adequate timeout was performed, attention was turned to the patient's abdomen where a small transverse skin incision was made under the umbilical fold. The incision was taken down to the layer of fascia using the scalpel, and fascia was incised, and extended bilaterally using Mayo scissors. The peritoneum was entered in a sharp fashion. Attention was then turned to the patient's uterus, and left fallopian tube was identified and followed out to the fimbriated end.  A Filshie clip was placed on the left fallopian tube about 2 cm from the cornual attachment, with care given to incorporate the underlying mesosalpinx.  A similar process was carried out on the right side allowing for bilateral tubal sterilization.  Good hemostasis was noted overall.  Local analgesia was drizzled on both operative sites.The instruments were  then removed from the patient's abdomen and the fascial incision was repaired with 0 Vicryl, and the skin was closed with a 3-0 Monocryl subcuticular stitch. The patient tolerated the procedure well.  Sponge, lap, and needle counts were correct times two.  The patient was then taken to the recovery room awake in stable condition.  Scheryl DarterJames  MD 05/21/2017 2:15 PM

## 2017-05-21 NOTE — Anesthesia Preprocedure Evaluation (Signed)
Anesthesia Evaluation  Patient identified by MRN, date of birth, ID band Patient awake    Reviewed: Allergy & Precautions, H&P , NPO status , Patient's Chart, lab work & pertinent test results  History of Anesthesia Complications Negative for: history of anesthetic complications  Airway Mallampati: II  TM Distance: >3 FB Neck ROM: full    Dental no notable dental hx. (+) Teeth Intact   Pulmonary neg pulmonary ROS,    Pulmonary exam normal breath sounds clear to auscultation       Cardiovascular hypertension, Normal cardiovascular exam Rhythm:regular Rate:Normal     Neuro/Psych negative neurological ROS  negative psych ROS   GI/Hepatic negative GI ROS, Neg liver ROS,   Endo/Other  negative endocrine ROS  Renal/GU negative Renal ROS  negative genitourinary   Musculoskeletal   Abdominal (+) + obese,   Peds  Hematology  (+) anemia ,   Anesthesia Other Findings   Reproductive/Obstetrics For post partum tubal ligation                             Anesthesia Physical  Anesthesia Plan  ASA: II  Anesthesia Plan: Epidural   Post-op Pain Management:    Induction:   PONV Risk Score and Plan: 3 and Ondansetron, Treatment may vary due to age or medical condition and Midazolam  Airway Management Planned: Natural Airway and Simple Face Mask  Additional Equipment:   Intra-op Plan:   Post-operative Plan:   Informed Consent: I have reviewed the patients History and Physical, chart, labs and discussed the procedure including the risks, benefits and alternatives for the proposed anesthesia with the patient or authorized representative who has indicated his/her understanding and acceptance.     Plan Discussed with: CRNA  Anesthesia Plan Comments:         Anesthesia Quick Evaluation

## 2017-05-21 NOTE — Transfer of Care (Signed)
Immediate Anesthesia Transfer of Care Note  Patient: Ann CorollaJaerika Palma  Procedure(s) Performed: POST PARTUM TUBAL LIGATION (Bilateral Abdomen)  Patient Location: PACU  Anesthesia Type:Epidural  Level of Consciousness: awake, alert  and oriented  Airway & Oxygen Therapy: Patient Spontanous Breathing  Post-op Assessment: Report given to RN and Post -op Vital signs reviewed and stable  Post vital signs: Reviewed and stable  Last Vitals:  Vitals:   05/21/17 0747 05/21/17 1302  BP: (!) 117/59 135/76  Pulse: (!) 112 (!) 117  Resp: 16 16  Temp: 37.7 C 36.6 C  SpO2: 98%     Last Pain:  Vitals:   05/21/17 1302  TempSrc: Oral  PainSc:       Patients Stated Pain Goal: 7 (05/20/17 [id number])  Complications: No apparent anesthesia complications

## 2017-05-22 ENCOUNTER — Encounter (HOSPITAL_COMMUNITY): Payer: Self-pay | Admitting: Obstetrics & Gynecology

## 2017-05-22 NOTE — Anesthesia Postprocedure Evaluation (Signed)
Anesthesia Post Note  Patient: Nicholaus CorollaJaerika Dobrowolski  Procedure(s) Performed: POST PARTUM TUBAL LIGATION (Bilateral Abdomen)     Patient location during evaluation: Mother Baby Anesthesia Type: Epidural Level of consciousness: awake and alert and oriented Pain management: pain level controlled Vital Signs Assessment: post-procedure vital signs reviewed and stable Respiratory status: spontaneous breathing and nonlabored ventilation Cardiovascular status: stable Postop Assessment: no headache, patient able to bend at knees, no backache, no apparent nausea or vomiting, epidural receding and adequate PO intake Anesthetic complications: no    Last Vitals:  Vitals:   05/22/17 0100 05/22/17 0500  BP: 127/68 (!) 143/85  Pulse: (!) 107 91  Resp: 18 20  Temp: 36.7 C 36.6 C  SpO2:      Last Pain:  Vitals:   05/22/17 0500  TempSrc: Oral  PainSc: 2    Pain Goal: Patients Stated Pain Goal: 3 (05/21/17 1615)               Laban EmperorMalinova,Annaleah Arata Hristova

## 2017-05-22 NOTE — Addendum Note (Signed)
Addendum  created 05/22/17 0853 by Elgie CongoMalinova, Omere Marti H, CRNA   Sign clinical note

## 2017-05-23 ENCOUNTER — Encounter (HOSPITAL_COMMUNITY): Payer: Self-pay

## 2017-05-23 MED ORDER — AMLODIPINE BESYLATE 10 MG PO TABS: 10.0000 mg | ORAL_TABLET | Freq: Every day | ORAL | 3 refills | Status: DC

## 2017-05-23 MED ORDER — TRAMADOL HCL 50 MG PO TABS
50.0000 mg | ORAL_TABLET | Freq: Four times a day (QID) | ORAL | 0 refills | Status: AC | PRN
Start: 2017-05-23 — End: 2018-05-23

## 2017-05-23 NOTE — Discharge Instructions (Signed)
Vaginal Delivery, Care After °Refer to this sheet in the next few weeks. These instructions provide you with information about caring for yourself after vaginal delivery. Your health care provider may also give you more specific instructions. Your treatment has been planned according to current medical practices, but problems sometimes occur. Call your health care provider if you have any problems or questions. °What can I expect after the procedure? °After vaginal delivery, it is common to have: °· Some bleeding from your vagina. °· Soreness in your abdomen, your vagina, and the area of skin between your vaginal opening and your anus (perineum). °· Pelvic cramps. °· Fatigue. ° °Follow these instructions at home: °Medicines °· Take over-the-counter and prescription medicines only as told by your health care provider. °· If you were prescribed an antibiotic medicine, take it as told by your health care provider. Do not stop taking the antibiotic until it is finished. °Driving ° °· Do not drive or operate heavy machinery while taking prescription pain medicine. °· Do not drive for 24 hours if you received a sedative. °Lifestyle °· Do not drink alcohol. This is especially important if you are breastfeeding or taking medicine to relieve pain. °· Do not use tobacco products, including cigarettes, chewing tobacco, or e-cigarettes. If you need help quitting, ask your health care provider. °Eating and drinking °· Drink at least 8 eight-ounce glasses of water every day unless you are told not to by your health care provider. If you choose to breastfeed your baby, you may need to drink more water than this. °· Eat high-fiber foods every day. These foods may help prevent or relieve constipation. High-fiber foods include: °? Whole grain cereals and breads. °? Brown rice. °? Beans. °? Fresh fruits and vegetables. °Activity °· Return to your normal activities as told by your health care provider. Ask your health care provider  what activities are safe for you. °· Rest as much as possible. Try to rest or take a nap when your baby is sleeping. °· Do not lift anything that is heavier than your baby or 10 lb (4.5 kg) until your health care provider says that it is safe. °· Talk with your health care provider about when you can engage in sexual activity. This may depend on your: °? Risk of infection. °? Rate of healing. °? Comfort and desire to engage in sexual activity. °Vaginal Care °· If you have an episiotomy or a vaginal tear, check the area every day for signs of infection. Check for: °? More redness, swelling, or pain. °? More fluid or blood. °? Warmth. °? Pus or a bad smell. °· Do not use tampons or douches until your health care provider says this is safe. °· Watch for any blood clots that may pass from your vagina. These may look like clumps of dark red, brown, or black discharge. °General instructions °· Keep your perineum clean and dry as told by your health care provider. °· Wear loose, comfortable clothing. °· Wipe from front to back when you use the toilet. °· Ask your health care provider if you can shower or take a bath. If you had an episiotomy or a perineal tear during labor and delivery, your health care provider may tell you not to take baths for a certain length of time. °· Wear a bra that supports your breasts and fits you well. °· If possible, have someone help you with household activities and help care for your baby for at least a few days after   you leave the hospital. °· Keep all follow-up visits for you and your baby as told by your health care provider. This is important. °Contact a health care provider if: °· You have: °? Vaginal discharge that has a bad smell. °? Difficulty urinating. °? Pain when urinating. °? A sudden increase or decrease in the frequency of your bowel movements. °? More redness, swelling, or pain around your episiotomy or vaginal tear. °? More fluid or blood coming from your episiotomy or  vaginal tear. °? Pus or a bad smell coming from your episiotomy or vaginal tear. °? A fever. °? A rash. °? Little or no interest in activities you used to enjoy. °? Questions about caring for yourself or your baby. °· Your episiotomy or vaginal tear feels warm to the touch. °· Your episiotomy or vaginal tear is separating or does not appear to be healing. °· Your breasts are painful, hard, or turn red. °· You feel unusually sad or worried. °· You feel nauseous or you vomit. °· You pass large blood clots from your vagina. If you pass a blood clot from your vagina, save it to show to your health care provider. Do not flush blood clots down the toilet without having your health care provider look at them. °· You urinate more than usual. °· You are dizzy or light-headed. °· You have not breastfed at all and you have not had a menstrual period for 12 weeks after delivery. °· You have stopped breastfeeding and you have not had a menstrual period for 12 weeks after you stopped breastfeeding. °Get help right away if: °· You have: °? Pain that does not go away or does not get better with medicine. °? Chest pain. °? Difficulty breathing. °? Blurred vision or spots in your vision. °? Thoughts about hurting yourself or your baby. °· You develop pain in your abdomen or in one of your legs. °· You develop a severe headache. °· You faint. °· You bleed from your vagina so much that you fill two sanitary pads in one hour. °This information is not intended to replace advice given to you by your health care provider. Make sure you discuss any questions you have with your health care provider. °Document Released: 05/05/2000 Document Revised: 10/20/2015 Document Reviewed: 05/23/2015 °Elsevier Interactive Patient Education © 2018 [employer] Inc[employer] °Laparoscopic Tubal Ligation, Care After °Refer to this sheet in the next few weeks. These instructions provide you with information about caring for yourself after your procedure. Your health care  provider may also give you more specific instructions. Your treatment has been planned according to current medical practices, but problems sometimes occur. Call your health care provider if you have any problems or questions after your procedure. °What can I expect after the procedure? °After the procedure, it is common to have: °· A sore throat. °· Discomfort in your shoulder. °· Mild discomfort or cramping in your abdomen. °· Gas pains. °· Pain or soreness in the area where the surgical cut (incision) was made. °· A bloated feeling. °· Tiredness. °· Nausea. °· Vomiting. ° °Follow these instructions at home: °Medicines °· Take over-the-counter and prescription medicines only as told by your health care provider. °· Do not take aspirin because it can cause bleeding. °· Do not drive or operate heavy machinery while taking prescription pain medicine. °Activity °· Rest for the rest of the day. °· Return to your normal activities as told by your health care provider. Ask your health care provider what activities are   safe for you. °Incision care ° °· Follow instructions from your health care provider about how to take care of your incision. Make sure you: °? Wash your hands with soap and water before you change your bandage (dressing). If soap and water are not available, use hand sanitizer. °? Change your dressing as told by your health care provider. °? Leave stitches (sutures) in place. They may need to stay in place for 2 weeks or longer. °· Check your incision area every day for signs of infection. Check for: °? More redness, swelling, or pain. °? More fluid or blood. °? Warmth. °? Pus or a bad smell. °Other Instructions °· Do not take baths, swim, or use a hot tub until your health care provider approves. You may take showers. °· Keep all follow-up visits as told by your health care provider. This is important. °· Have someone help you with your daily household tasks for the first few days. °Contact a health care  provider if: °· You have more redness, swelling, or pain around your incision. °· Your incision feels warm to the touch. °· You have pus or a bad smell coming from your incision. °· The edges of your incision break open after the sutures have been removed. °· Your pain does not improve after 2-3 days. °· You have a rash. °· You repeatedly become dizzy or light-headed. °· Your pain medicine is not helping. °· You are constipated. °Get help right away if: °· You have a fever. °· You faint. °· You have increasing pain in your abdomen. °· You have severe pain in one or both of your shoulders. °· You have fluid or blood coming from your sutures or from your vagina. °· You have shortness of breath or difficulty breathing. °· You have chest pain or leg pain. °· You have ongoing nausea, vomiting, or diarrhea. °This information is not intended to replace advice given to you by your health care provider. Make sure you discuss any questions you have with your health care provider. °Document Released: 11/25/2004 Document Revised: 10/11/2015 Document Reviewed: 04/18/2015 °Elsevier Interactive Patient Education © 2018 Elsevier Inc. ° °

## 2017-05-23 NOTE — Discharge Summary (Signed)
OB Discharge Summary     Patient Name: Ann Robbins DOB: 1990/12/26 MRN: [id number]  Date of admission: 05/20/2017 Delivering MD: Frederik Pear   Date of discharge: 05/23/2017  Admitting diagnosis: 39 wk induction Intrauterine pregnancy: [redacted]w[redacted]d     Secondary diagnosis:  Principal Problem:   SVD (spontaneous vaginal delivery) Active Problems:   Obesity   Supervision of high risk pregnancy, antepartum   Chronic hypertension during pregnancy, antepartum   Chronic hypertension complicating or reason for care during pregnancy, third trimester  Additional problems: desires ppBTL     Discharge diagnosis: Term Pregnancy Delivered and CHTN                                                                                                Post partum procedures:tubal ligation  Augmentation: AROM, Pitocin and Cytotec  Complications: None  Hospital course:  Induction of Labor With Vaginal Delivery   27 y.o. yo G3P3003 at [redacted]w[redacted]d was admitted to the hospital 05/20/2017 for induction of labor.  Indication for induction: cHTN.  Patient had an uncomplicated labor course as follows: Membrane Rupture Time/Date: 4:45 PM ,05/20/2017   Intrapartum Procedures: Episiotomy: None [1]                                         Lacerations:  Periurethral [8]  Patient had delivery of a Viable infant.  Information for the patient's newborn:  Favor, Kreh [[id number]]  Delivery Method: Vaginal, Spontaneous(Filed from Delivery Summary)   05/21/2017  Details of delivery can be found in separate delivery note.  Patient had a routine postpartum course. On the day of delivery pt had a ppBTL which she tolerated well. Patient is discharged home 05/23/17.  Physical exam  Vitals:   05/22/17 0942 05/22/17 1208 05/22/17 1735 05/23/17 0500  BP: 136/89 135/74 (!) 144/85 128/65  Pulse:  (!) 114 (!) 108 100  Resp:  18 20   Temp:  97.9 F (36.6 C) 98.1 F (36.7 C) 97.9 F (36.6 C)  TempSrc:  Axillary Oral Oral   SpO2:      Weight:    95.9 kg (211 lb 6.4 oz)  Height:       General: alert and cooperative Lochia: appropriate Uterine Fundus: firm Incision: umbilical inc CDI DVT Evaluation: No evidence of DVT seen on physical exam. Labs: Lab Results  Component Value Date   WBC 11.0 (H) 05/20/2017   HGB 9.8 (L) 05/20/2017   HCT 29.6 (L) 05/20/2017   MCV 87.3 05/20/2017   PLT 226 05/20/2017   No flowsheet data found.  Discharge instruction: per After Visit Summary and "Baby and Me Booklet".  After visit meds:  Allergies as of 05/23/2017   No Known Allergies     Medication List    STOP taking these medications   labetalol 100 MG tablet Commonly known as:  NORMODYNE     TAKE these medications   amLODipine 10 MG tablet Commonly known as:  NORVASC Take 1 tablet (10 mg total)  by mouth daily.   prenatal vitamin w/FE, FA 27-1 MG Tabs tablet Take 1 tablet by mouth daily   traMADol 50 MG tablet Commonly known as:  ULTRAM Take 1 tablet (50 mg total) by mouth every 6 (six) hours as needed.       Diet: routine diet  Activity: Advance as tolerated. Pelvic rest for 6 weeks.   Outpatient follow up:BP check next week; PP visit 4 weeks Follow up Appt: Future Appointments  Date Time Provider Department Center  05/28/2017  9:45 AM [facility] NURSE [facility]   Follow up Visit:No Follow-up on file.  Postpartum contraception: Tubal Ligation  Newborn Data: Live born female  Birth Weight: 8 lb 9.2 oz (3890 g) APGAR: 9, 9  Newborn Delivery   Birth date/time:  05/21/2017 00:35:00 Delivery type:  Vaginal, Spontaneous     Baby Feeding: Bottle Disposition:home with mother   05/23/2017 Cam HaiSHAW, KIMBERLY, CNM 7:13 AM

## 2017-05-28 ENCOUNTER — Ambulatory Visit (INDEPENDENT_AMBULATORY_CARE_PROVIDER_SITE_OTHER): Payer: Medicaid Other | Admitting: General Practice

## 2017-05-28 VITALS — BP 144/88 | HR 90 | Ht 67.0 in | Wt 208.0 lb

## 2017-05-28 DIAGNOSIS — Z0130 Encounter for examination of blood pressure without abnormal findings: Secondary | ICD-10-CM

## 2017-05-28 DIAGNOSIS — O165 Unspecified maternal hypertension, complicating the puerperium: Secondary | ICD-10-CM

## 2017-05-28 MED ORDER — HYDROCHLOROTHIAZIDE 25 MG PO TABS: 25.0000 mg | ORAL_TABLET | Freq: Every day | ORAL | 1 refills | Status: DC

## 2017-05-28 NOTE — Progress Notes (Signed)
Patient here for BP check today. Patient denies headaches, dizziness or blurry vision. Reports taking norvasc daily.   Reviewed BP with Dr Adrian BlackwaterStinson, who recommends adding hctz 25mg  daily- patient can follow up at scheduled pp visit on 2/11.  Discussed with patient & informed her of pp visit. Patient verbalized understanding & is aware to return for HA's, dizziness, or blurry vision. Patient had no questions

## 2017-07-02 ENCOUNTER — Encounter: Payer: Self-pay | Admitting: Obstetrics and Gynecology

## 2017-07-02 ENCOUNTER — Ambulatory Visit (INDEPENDENT_AMBULATORY_CARE_PROVIDER_SITE_OTHER): Payer: Medicaid Other | Admitting: Obstetrics and Gynecology

## 2017-07-02 NOTE — Progress Notes (Signed)
Obstetrics Visit Postpartum Visit  Appointment Date: 07/03/2017  OB[facility]  Primary Care Provider: Julieanne MansonMulberry, Elizabeth  Chief Complaint:  Chief Complaint  Patient presents with  . Postpartum Care    History of Present Illness: Nicholaus CorollaJaerika Bayron is a 27 y.o. African-American [phone] (Patient's last menstrual period was 06/22/2017.), seen for the above chief complaint. Her past medical history is significant for cHTN   She is s/p SVD/intact perineum on 12/31; she was discharged to home on PPD#2. She had a normal period earlier this month  Vaginal bleeding or discharge: No  Breast or formula feeding: formula Intercourse: No  Contraception after delivery: ppBTL PP depression s/s: No  Any bowel or bladder issues: No  Pap smear: no abnormalities (date: 2017)  Review of Systems:  as noted in the History of Present Illness.  Medications Nicholaus CorollaJaerika Siragusa had no medications administered during this visit. Current Outpatient Medications  Medication Sig Dispense Refill  . amLODipine (NORVASC) 10 MG tablet Take 1 tablet (10 mg total) by mouth daily. 30 tablet 3  . hydrochlorothiazide (HYDRODIURIL) 25 MG tablet Take 1 tablet (25 mg total) by mouth daily. 30 tablet 1  . prenatal vitamin w/FE, FA (PRENATAL 1 + 1) 27-1 MG TABS tablet Take 1 tablet by mouth daily 30 each 12   No current facility-administered medications for this visit.     Allergies Patient has no known allergies.  Physical Exam:  BP (!) 146/82   Pulse 87   Wt 202 lb (91.6 kg)   LMP 06/22/2017   Breastfeeding? No   BMI 31.64 kg/m  Body mass index is 31.64 kg/m. General appearance: Well nourished, well developed female in no acute distress.  Cardiovascular: normal s1 and s2.  No murmurs, rubs or gallops. Respiratory:  Clear to auscultation bilateral. Normal respiratory effort Abdomen: positive bowel sounds and no masses, hernias; diffusely non tender to palpation, non  distended Neuro/Psych:  Normal mood and affect.  Skin:  Warm and dry.   Laboratory: none  PP Depression Screening:  0  Assessment: pt doing well  Plan:  Routine care. Pt told to call [facility] to establish primary care. Has refills for BP medication and told to call us if needs refill  RTC PRN  Cornelia Copaharlie Domonique Brouillard, Jr MD Attending [facility](Faculty Practice)

## 2017-07-04 ENCOUNTER — Encounter: Payer: Self-pay | Admitting: Obstetrics and Gynecology

## 2018-07-29 ENCOUNTER — Other Ambulatory Visit: Payer: Self-pay

## 2018-07-29 ENCOUNTER — Encounter (HOSPITAL_COMMUNITY): Payer: Self-pay | Admitting: Emergency Medicine

## 2018-07-29 ENCOUNTER — Ambulatory Visit (HOSPITAL_COMMUNITY)
Admission: EM | Admit: 2018-07-29 | Discharge: 2018-07-29 | Disposition: A | Discharge status: Home / Self Care | Payer: Medicaid Other | Attending: Family Medicine | Admitting: Family Medicine

## 2018-07-29 DIAGNOSIS — G44209 Tension-type headache, unspecified, not intractable: Secondary | ICD-10-CM

## 2018-07-29 DIAGNOSIS — I10 Essential (primary) hypertension: Secondary | ICD-10-CM

## 2018-07-29 DIAGNOSIS — O165 Unspecified maternal hypertension, complicating the puerperium: Secondary | ICD-10-CM

## 2018-07-29 MED ORDER — HYDROCHLOROTHIAZIDE 25 MG PO TABS: 25.0000 mg | ORAL_TABLET | Freq: Every day | ORAL | 0 refills | Status: DC

## 2018-07-29 MED ORDER — AMLODIPINE BESYLATE 10 MG PO TABS: 10.0000 mg | ORAL_TABLET | Freq: Every day | ORAL | 0 refills | Status: DC

## 2018-07-29 NOTE — Discharge Instructions (Signed)
  Please seek prompt medical care if: You have: A very bad (severe) headache that is not helped by medicine. Trouble walking or weakness in your arms and legs. Clear or bloody fluid coming from your nose or ears. Changes in your seeing (vision). Jerky movements that you cannot control (seizure). You throw up (vomit). Your symptoms get worse. You lose balance. Your speech is slurred. You pass out. You are sleepier and have trouble staying awake. The black centers of your eyes (pupils) change in size.  These symptoms may be an emergency. Do not wait to see if the symptoms will go away. Get medical help right away. Call your local emergency services. Do not drive yourself to the hospital.  

## 2018-07-29 NOTE — ED Provider Notes (Signed)
[facility]   [id number] 07/29/18 Arrival Time: 1540  ASSESSMENT & PLAN:  1. Acute non intractable tension-type headache   2. Hypertension, postpartum condition or complication   3. Essential hypertension    Meds ordered this encounter  Medications  . amLODipine (NORVASC) 10 MG tablet    Sig: Take 1 tablet (10 mg total) by mouth daily.    Dispense:  30 tablet    Refill:  0  . hydrochlorothiazide (HYDRODIURIL) 25 MG tablet    Sig: Take 1 tablet (25 mg total) by mouth daily.    Dispense:  30 tablet    Refill:  0   Normal neurological exam. Discussed. Current presentation and symptoms are consistent with prior migraines and are not consistent with SAH, ICH, meningitis, or temporal arteritis. Without fever, focal neuro logical deficits, nuchal rigidity, or change in vision. No indication for neurodiagnostic workup at this time. Discussed. See AVS for d/c instructions.  Refilled BP medications upon her request.  Follow-up Information    Schedule an appointment as soon as possible for a visit  with Julieanne Manson, MD.   Specialty:  Internal Medicine Contact information: [address] [place] [address] [phone]          Reviewed expectations re: course of current medical issues. Questions answered. Outlined signs and symptoms indicating need for more acute intervention. Patient verbalized understanding. After Visit Summary given.   SUBJECTIVE:  Ann Robbins is a 28 y.o. female who presents with complaint of a headache. Reports gradual onset 1 week ago. Intermittent. "Wanted to make sure my blood pressure wasn't high". Headache is intermittent. Does not affect sleep. Location: mainly frontal and superior without radiation. History of headaches: occasional and similar. Precipitating factors include: she questions stress at work. Associated symptoms: Preceding aura: no. Nausea/vomiting: no. Vision changes: no. Increased sensitivity to light and to  noises: no. Fever: no. Sinus pressure/congestion: no. Extremity weakness: no. Home treatment has included acetaminophen with marked improvement. Current headache has not limited normal daily activities. Denies depression, dizziness, loss of balance, muscle weakness, numbness of extremities, speech difficulties and vision problems. No head injury reported. Ambulatory without difficulty.  Has been out of her BP medications for several weeks; maybe longer.  ROS: As per HPI. All other systems negative.    OBJECTIVE:  Vitals:   07/29/18 1636 07/29/18 1638  BP: (!) 154/101 135/88  Pulse: 92 92  Resp: 18 18  Temp:  98 F (36.7 C)  TempSrc:  Temporal  SpO2: 100% 100%    General appearance: alert; no distress Eyes: PERRLA; EOMI; conjunctiva normal HENT: normocephalic; atraumatic Neck: supple with FROM Lungs: clear to auscultation bilaterally Heart: regular rate and rhythm Extremities: no edema; symmetrical with no gross deformities Skin: warm and dry Neurologic: CN 2-12 grossly intact; normal gait; normal symmetric reflexes; normal extremity strength and sensation throughout Psychological: alert and cooperative; normal mood and affect  No Known Allergies  Past Medical History:  Diagnosis Date  . Hypertension 2015   First with PIH and bp remained high after delivery   Social History   Socioeconomic History  . Marital status: Single    Spouse name: Nicanor Alcon  . Number of children: 2  . Years of education: college-some  . Highest education level: Not on file  Occupational History  . Occupation: [employer]  Social Needs  . Financial resource strain: Not on file  . Food insecurity:    Worry: Not on file    Inability: Not on  file  . Transportation needs:    Medical: Not on file    Non-medical: Not on file  Tobacco Use  . Smoking status: Never Smoker  . Smokeless tobacco: Never Used  Substance and Sexual Activity  . Alcohol use: No  . Drug use: No    . Sexual activity: Yes    Birth control/protection: None  Lifestyle  . Physical activity:    Days per week: Not on file    Minutes per session: Not on file  . Stress: Not on file  Relationships  . Social connections:    Talks on phone: Not on file    Gets together: Not on file    Attends religious service: Not on file    Active member of club or organization: Not on file    Attends meetings of clubs or organizations: Not on file    Relationship status: Not on file  . Intimate partner violence:    Fear of current or ex partner: Not on file    Emotionally abused: Not on file    Physically abused: Not on file    Forced sexual activity: Not on file  Other Topics Concern  . Not on file  Social History Narrative   Originally from [place] in March of [place]   Lives at home with boyfriend, Jena Gauss and their 2 children   Family History  Problem Relation Age of Onset  . Hypertension Mother    Past Surgical History:  Procedure Laterality Date  . CHOLECYSTECTOMY  02/2015   Laparoscopic  . TUBAL LIGATION Bilateral 05/21/2017   Procedure: POST PARTUM TUBAL LIGATION;  Surgeon: Adam Phenix, MD;  Location: [facility];  Service: Gynecology;  Laterality: Bilateral;     Mardella Layman, MD 07/29/18 [phone]

## 2018-07-29 NOTE — ED Triage Notes (Signed)
Pain in forehead intermittently for a week.  Denies runny nose, cough or sneezing.  Denies vision changes

## 2019-02-03 IMAGING — US US MFM OB FOLLOW-UP
1 series · 14 of 28 positions shown · non-contrast
Comparison: none

[Series 1: us mfm ob follow-up · 14 of 51 slices shown]
[im 2/51]
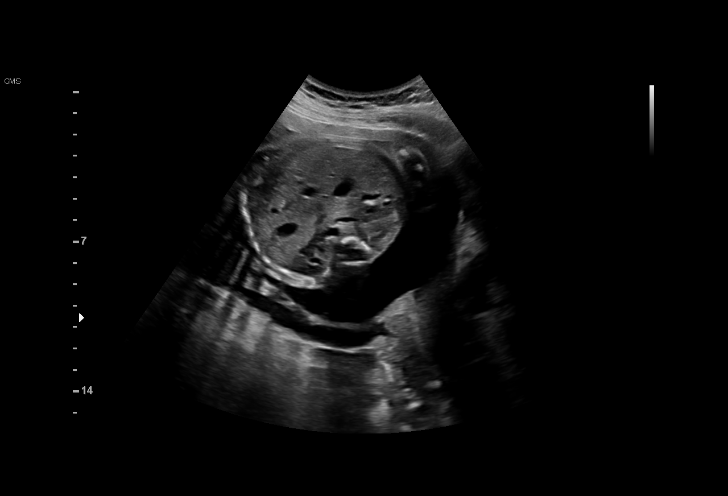
[im 6/51]
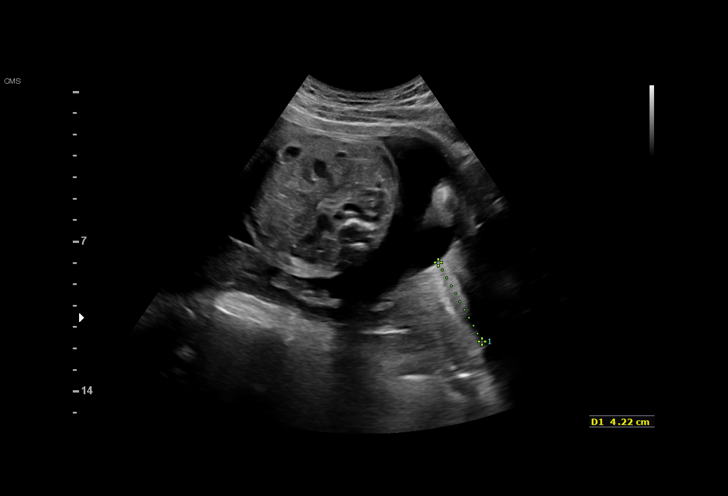
[im 10/51]
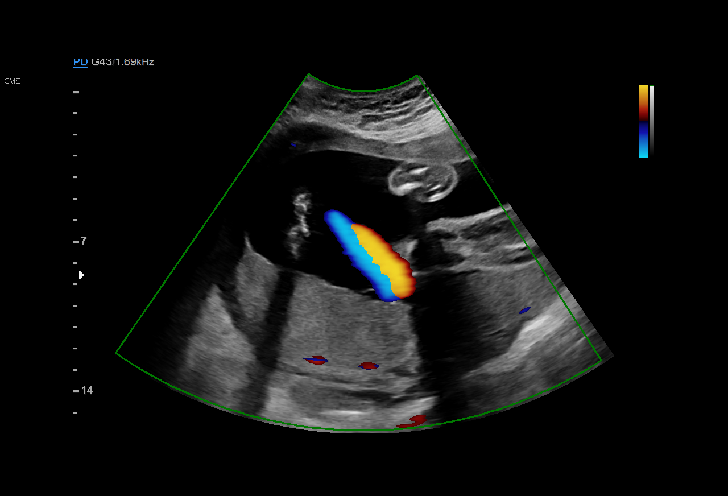
[im 13/51]
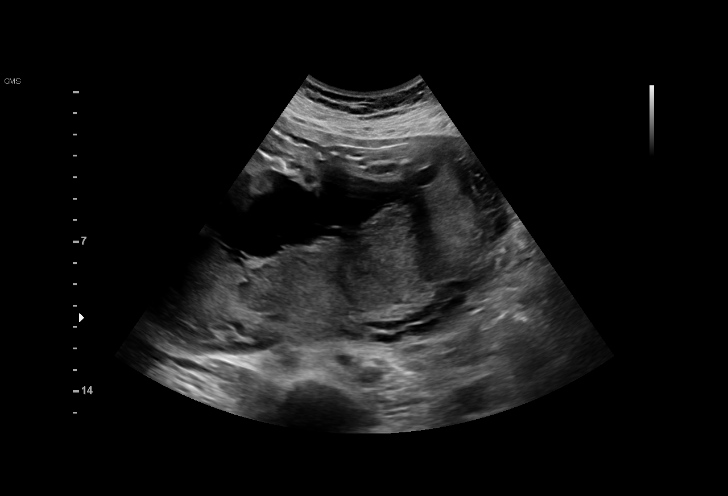
[im 17/51]
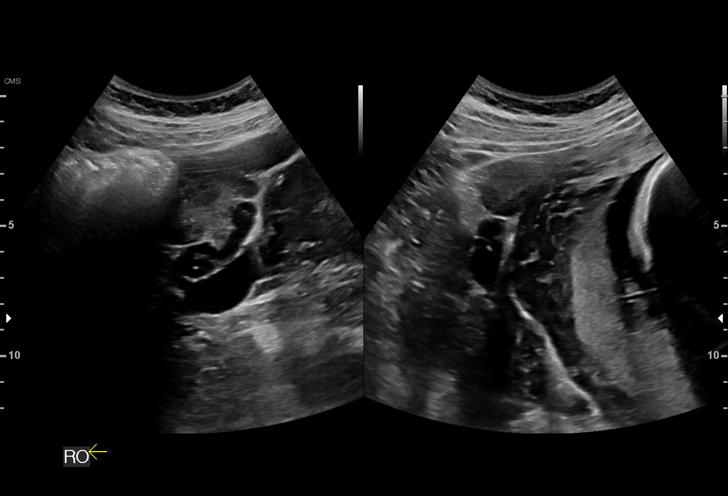
[im 21/51]
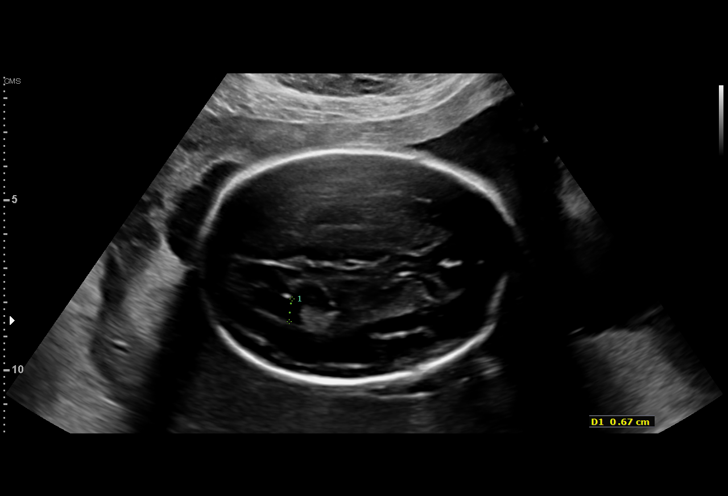
[im 25/51]
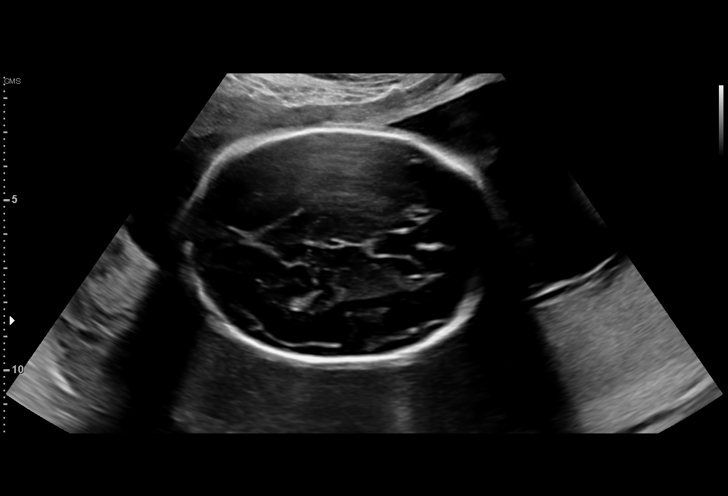
[im 28/51]
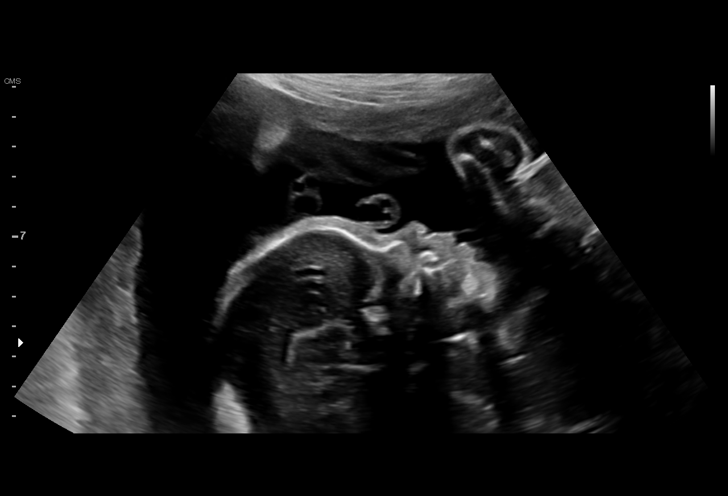
[im 32/51]
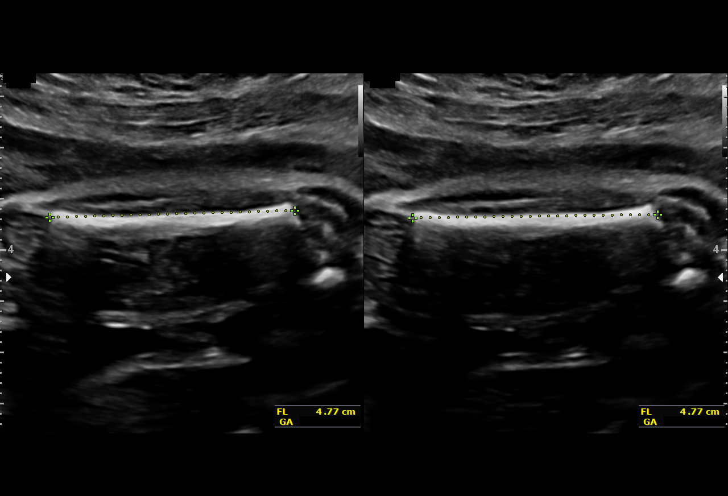
[im 36/51]
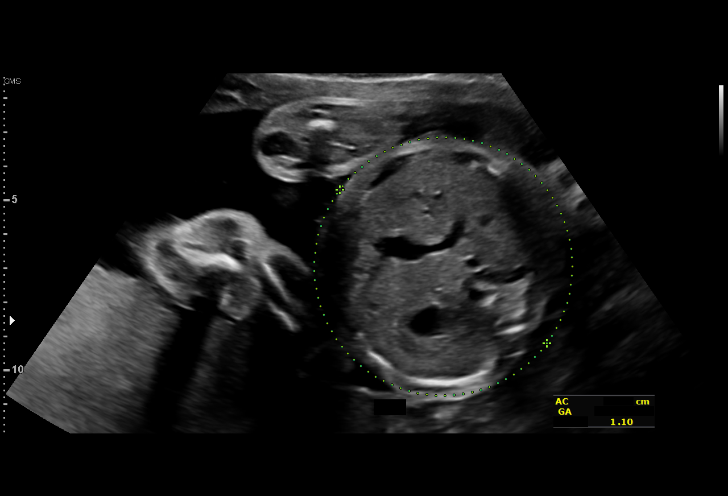
[im 39/51]
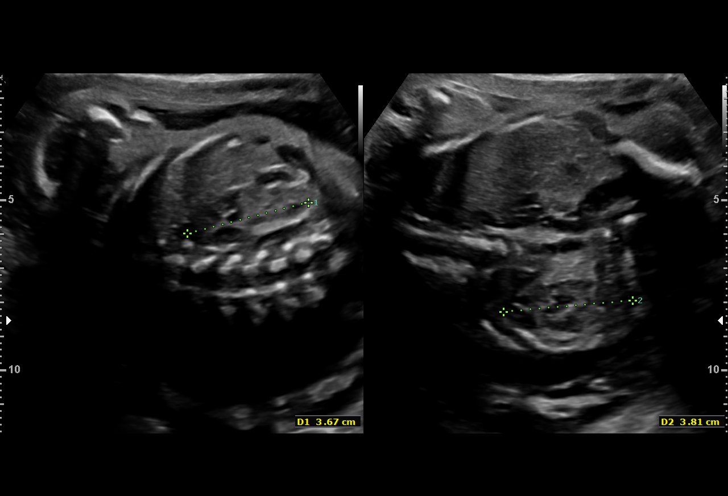
[im 43/51]
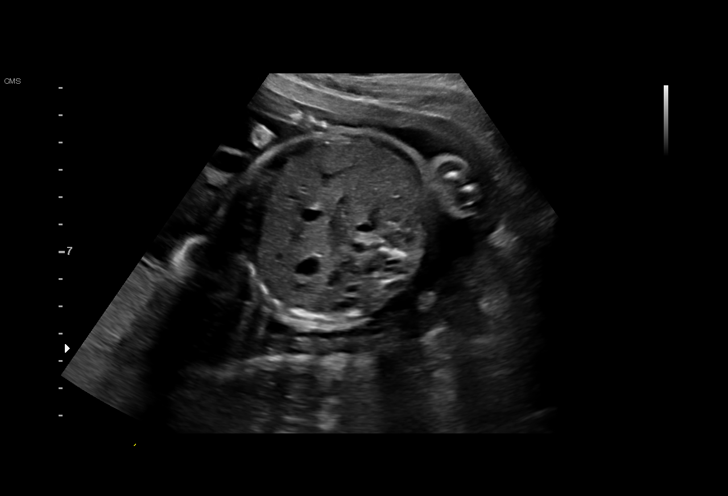
[im 47/51]
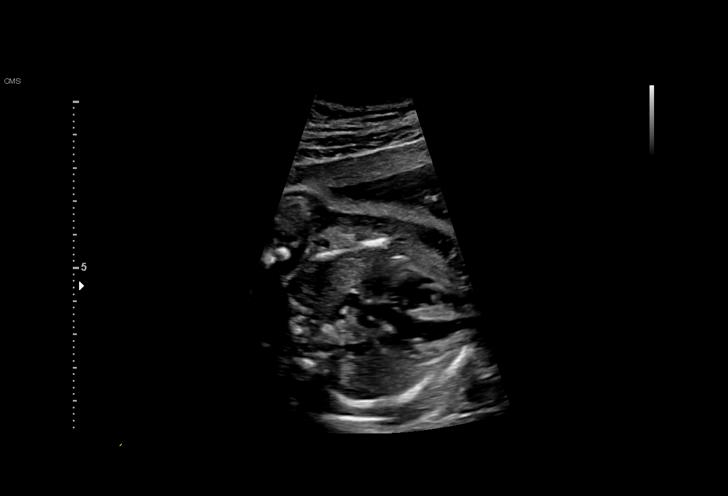
[im 51/51]
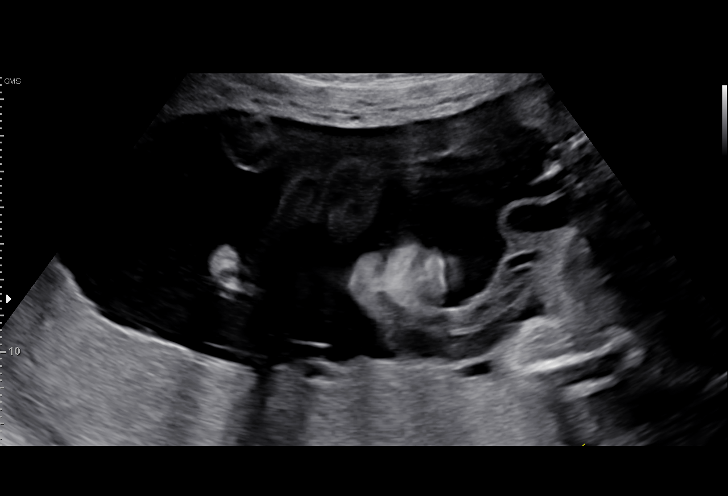

[14 of 28 positions shown; findings below may reference images not displayed]

OB/Gyn Clinic

1  DAVID OSCAR ANABALON            [id number]      [id number]     [id number]
Indications

26 weeks gestation of pregnancy
Hypertension - Chronic/Pre-existing
Obesity complicating pregnancy, second
trimester
OB History

Blood Type:            Height:  5'6"   Weight (lb):  218       BMI:
Gravidity:    3         Term:   2        Prem:   0        SAB:   0
TOP:          0       Ectopic:  0        Living: 2
Fetal Evaluation

Num Of Fetuses:     1
Fetal Heart         154
Rate(bpm):
Cardiac Activity:   Observed
Presentation:       Transverse, head to maternal right
Placenta:           Posterior
P. Cord Insertion:  Visualized

Amniotic Fluid
AFI FV:      Subjectively within normal limits

Largest Pocket(cm)
4.97
Biometry

BPD:      67.5  mm     G. Age:  27w 1d         66  %    CI:        67.66   %    70 - 86
FL/HC:      18.2   %    18.6 -
HC:      262.6  mm     G. Age:  28w 4d         88  %    HC/AC:      1.12        1.04 -
AC:      234.3  mm     G. Age:  27w 5d         80  %    FL/BPD:     70.7   %    71 - 87
FL:       47.7  mm     G. Age:  26w 0d         23  %    FL/AC:      20.4   %    20 - 24
HUM:      44.8  mm     G. Age:  26w 4d         49  %

Est. FW:    2325  gm      2 lb 5 oz     69  %
Gestational Age

LMP:           26w 3d        Date:  08/20/16                 EDD:   05/27/17
U/S Today:     27w 3d                                        EDD:   05/20/17
Best:          26w 3d     Det. By:  LMP  (08/20/16)          EDD:   05/27/17
Anatomy

Cranium:               Appears normal         Aortic Arch:            Previously seen
Cavum:                 Appears normal         Ductal Arch:            Previously seen
Ventricles:            Appears normal         Diaphragm:              Appears normal
Choroid Plexus:        Appears normal         Stomach:                Appears normal, left
sided
Cerebellum:            Appears normal         Abdomen:                Appears normal
Posterior Fossa:       Appears normal         Abdominal Wall:         Previously seen
Nuchal Fold:           Previously seen        Cord Vessels:           Appears normal (3
vessel cord)
Face:                  Appears normal         Kidneys:                Appear normal
(orbits and profile)
Lips:                  Appears normal         Bladder:                Appears normal
Thoracic:              Appears normal         Spine:                  Previously seen
Heart:                 Previously seen        Upper Extremities:      Previously seen
RVOT:                  Previously seen        Lower Extremities:      Previously seen
LVOT:                  Appears normal

Other:  Male gender previously visualized. Heels and 5th digit previously
visualized. Technically difficult due to fetal position.
Cervix Uterus Adnexa

Cervix
Length:            4.1  cm.
Normal appearance by transabdominal scan.

Uterus
No abnormality visualized.

Left Ovary
Within normal limits.

Right Ovary
Within normal limits.

Cul De Sac:   No free fluid seen.

Adnexa:       No abnormality visualized.
Impression

SIUP at 26+3 weeks
Normal interval anatomy; anatomic survey complete
Normal amniotic fluid volume
Appropriate interval growth with EFW at the 69th %tile
Recommendations

Follow-up ultrasound for growth and EV to assess inferior
edge of placenta in 4 weeks

## 2019-03-31 IMAGING — US US MFM OB FOLLOW-UP
1 series · 14 of 28 positions shown · non-contrast
Comparison: none

[Series 1: us mfm ob follow-up · 57 acquisitions, 14 frames shown]
[im 3/57]
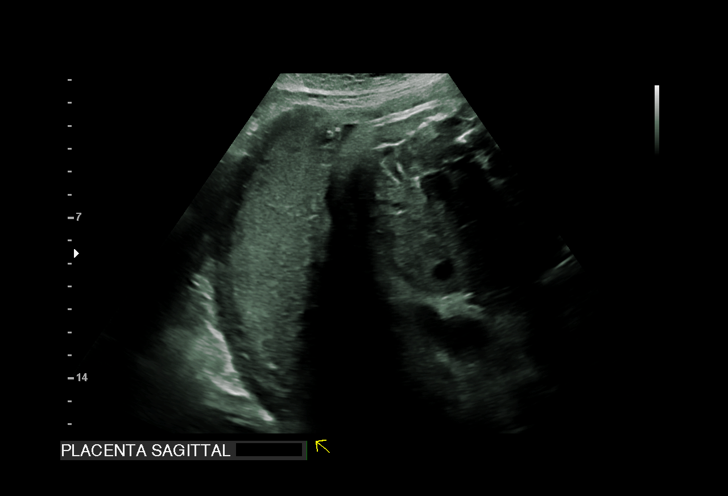
[im 7/57]
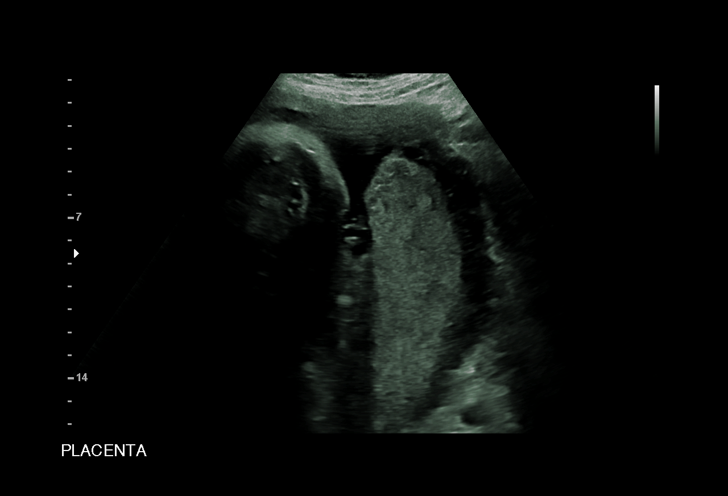
[im 11/57]
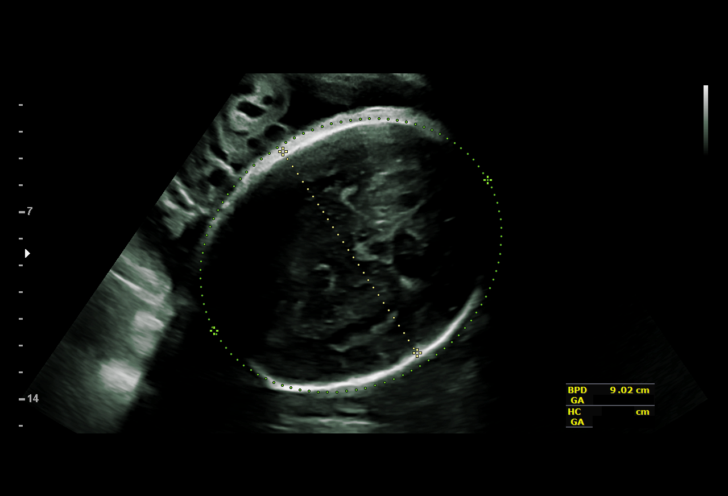
[im 15/57]
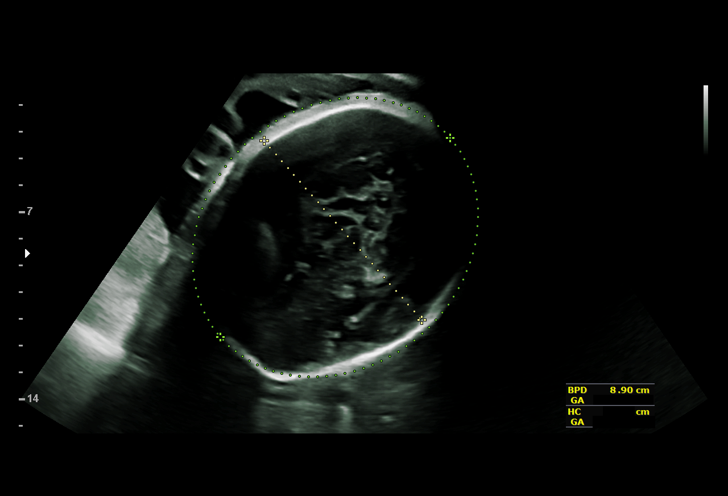
[im 19/57]
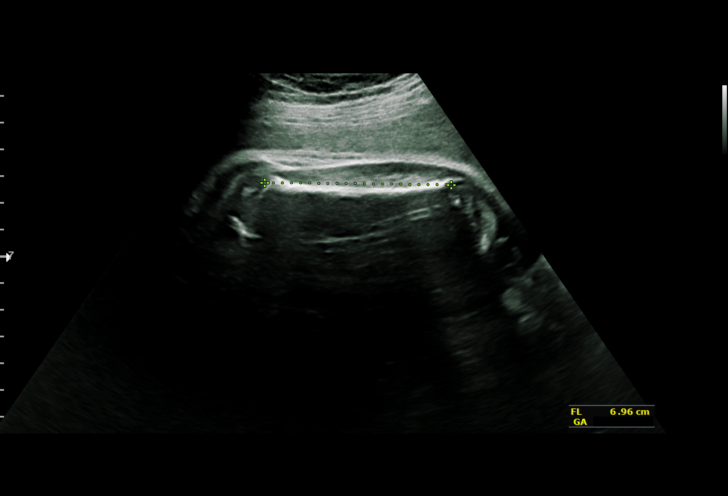
[im 23/57]
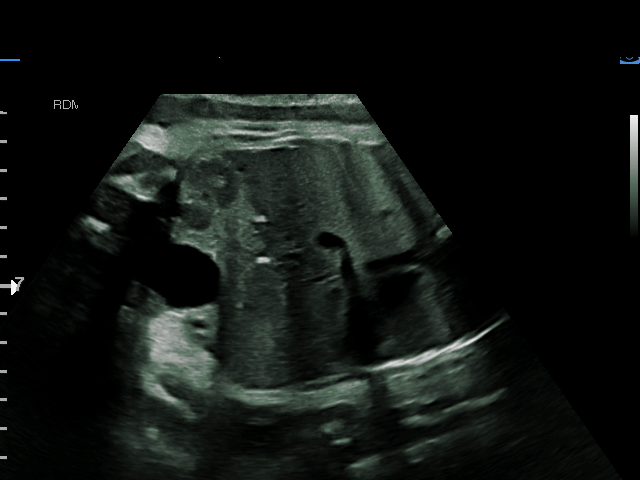
[im 27/57]
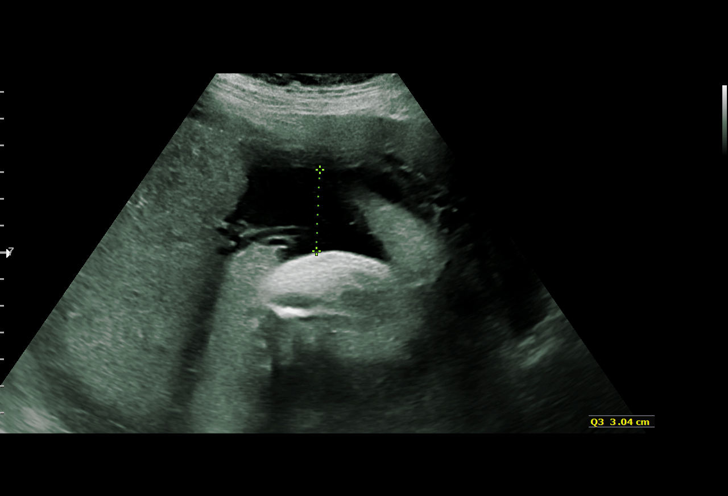
[im 32/57]
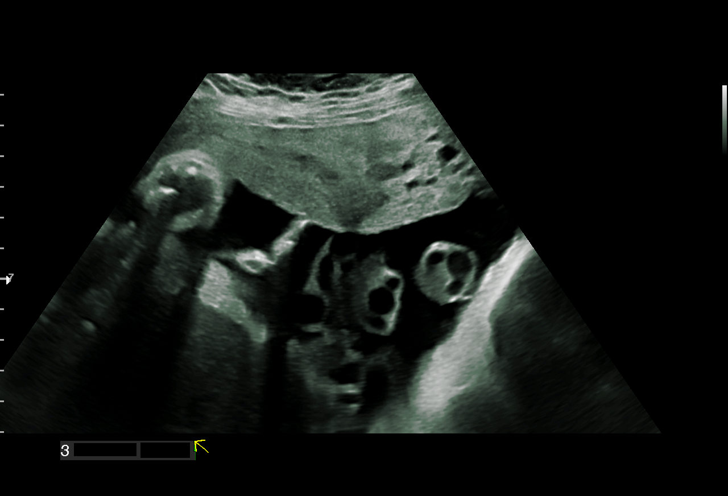
[im 36/57]
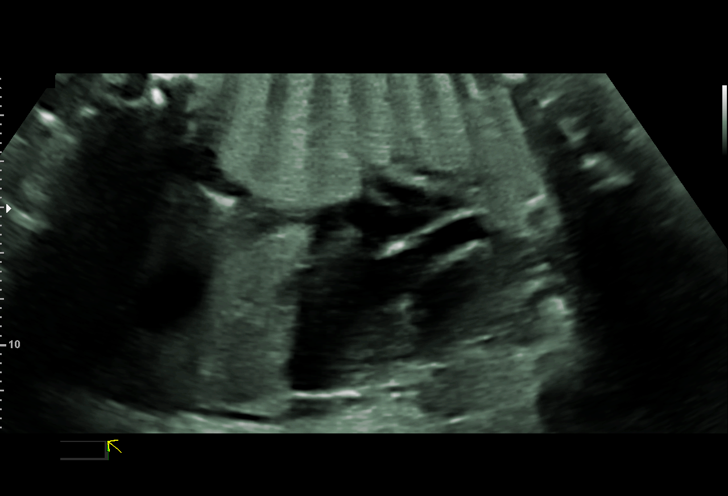
[im 40/57]
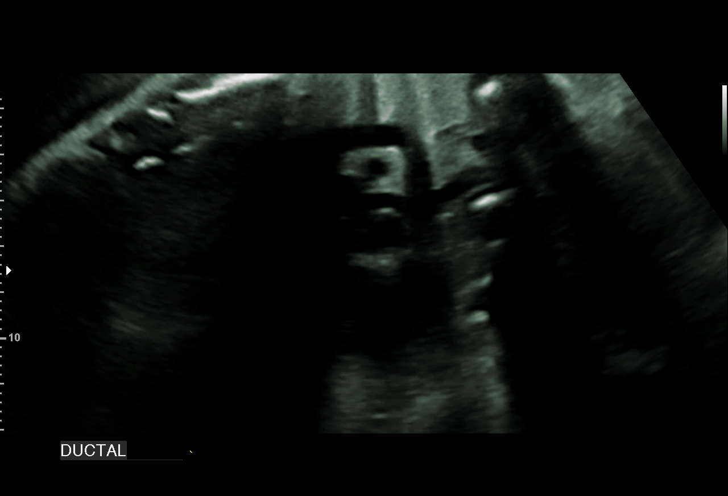
[im 44/57]
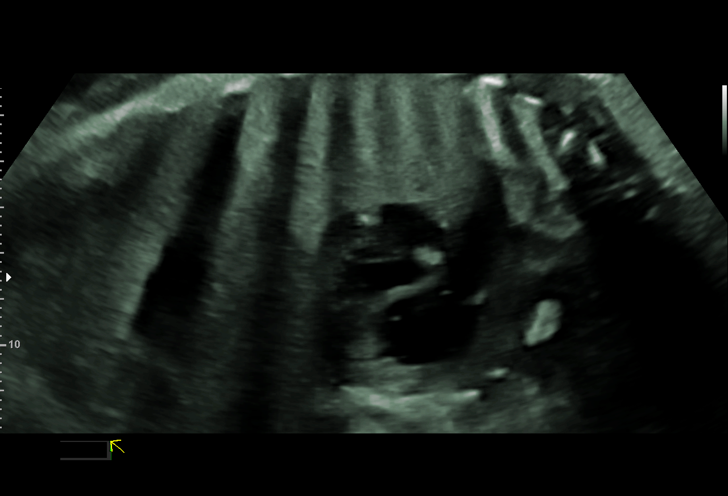
[im 48/57]
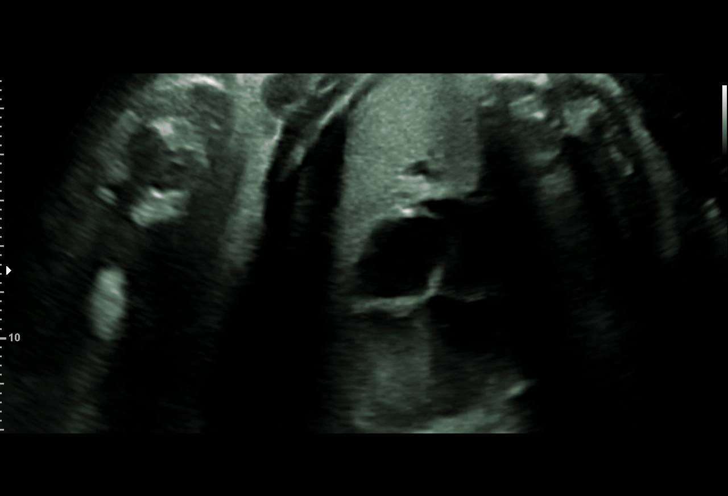
[im 52/57]
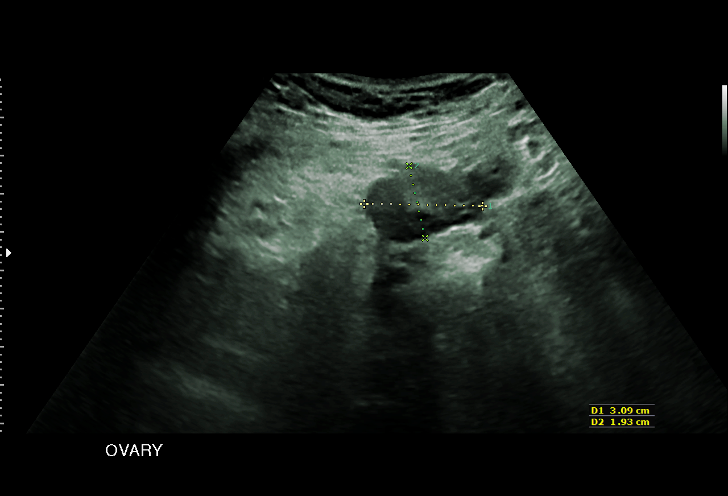
[im 57/57]
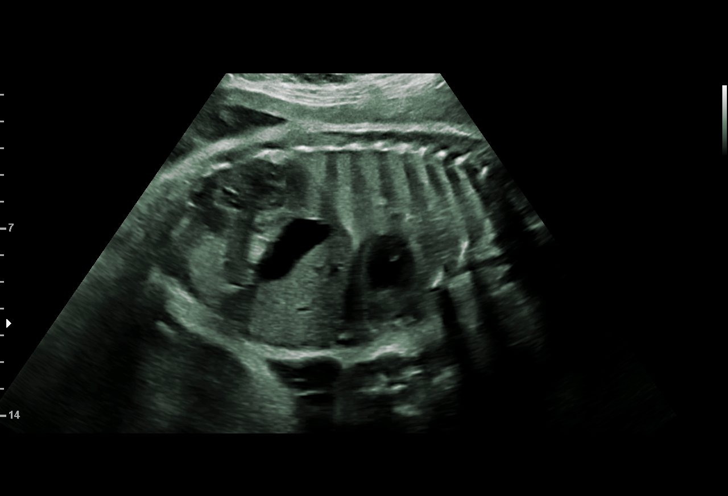

[14 of 28 positions shown; findings below may reference images not displayed]

OB/Gyn Clinic

1  NAYITHO CHIROQUE            [id number]      [id number]     [id number]
Indications

34 weeks gestation of pregnancy
Unspecified pre-existing hypertension
complicating pregnancy, third trimester
Obesity complicating pregnancy, second
trimester
OB History

Blood Type:            Height:  5'6"   Weight (lb):  218       BMI:
Gravidity:    3         Term:   2        Prem:   0        SAB:   0
TOP:          0       Ectopic:  0        Living: 2
Fetal Evaluation

Num Of Fetuses:     1
Fetal Heart         161
Rate(bpm):
Cardiac Activity:   Observed
Presentation:       Cephalic
Placenta:           Posterior Fundal, above cervical os

Amniotic Fluid
AFI FV:      Subjectively within normal limits

AFI Sum(cm)     %Tile       Largest Pocket(cm)
20.54           77
RUQ(cm)       RLQ(cm)       LUQ(cm)        LLQ(cm)
5.92
Biometry

BPD:      89.5  mm     G. Age:  36w 2d         91  %    CI:        73.46   %    70 - 86
FL/HC:      20.8   %    19.4 -
HC:      331.8  mm     G. Age:  37w 6d         91  %    HC/AC:      1.05        0.96 -
AC:      316.1  mm     G. Age:  35w 4d         83  %    FL/BPD:     77.0   %    71 - 87
FL:       68.9  mm     G. Age:  35w 2d         66  %    FL/AC:      21.8   %    20 - 24
HUM:      63.1  mm     G. Age:  36w 5d       > 95  %
CER:      50.1  mm     G. Age:  N/A          > 95  %

Est. FW:    9449  gm      6 lb 2 oz     82  %
Gestational Age

LMP:           34w 3d        Date:  08/20/16                 EDD:   05/27/17
U/S Today:     36w 2d                                        EDD:   05/14/17
Best:          34w 3d     Det. By:  LMP  (08/20/16)          EDD:   05/27/17
Anatomy

Cranium:               Appears normal         Aortic Arch:            Appears normal
Cavum:                 Appears normal         Ductal Arch:            Appears normal
Ventricles:            Appears normal         Diaphragm:              Appears normal
Choroid Plexus:        Appears normal         Stomach:                Appears normal, left
sided
Cerebellum:            Appears normal         Abdomen:                Appears normal
Posterior Fossa:       Previously seen        Abdominal Wall:         Previously seen
Nuchal Fold:           Previously seen        Cord Vessels:           Appears normal (3
vessel cord)
Face:                  Orbits and profile     Kidneys:                Appear normal
previously seen
Lips:                  Previously seen        Bladder:                Appears normal
Thoracic:              Appears normal         Spine:                  Previously seen
Heart:                 Appears normal         Upper Extremities:      Previously seen
(4CH, axis, and situs
RVOT:                  Appears normal         Lower Extremities:      Previously seen
LVOT:                  Appears normal

Other:  Male gender previously visualized. Heels and 5th digit previously
visualized. Technically difficult due to fetal position.
Cervix Uterus Adnexa

Cervix
Not visualized (advanced GA >04wks)

Left Ovary
Within normal limits.

Right Ovary
Within normal limits.
Impression

SIUP at 31w3d
active fetus
EFW 82nd%'le
no structural defects
no previa
normal AFI
Recommendations

Given HTN, recommend continued antenatal testing and
serial fetal growth until delivery.

## 2019-04-07 IMAGING — US US FETAL BPP W/ NON-STRESS
1 series · 13 of 13 positions shown · non-contrast
Comparison: none

[Series 1: us fetal bpp w/nonstress · 13 acquisitions, 13 frames shown]
[im 1/13]
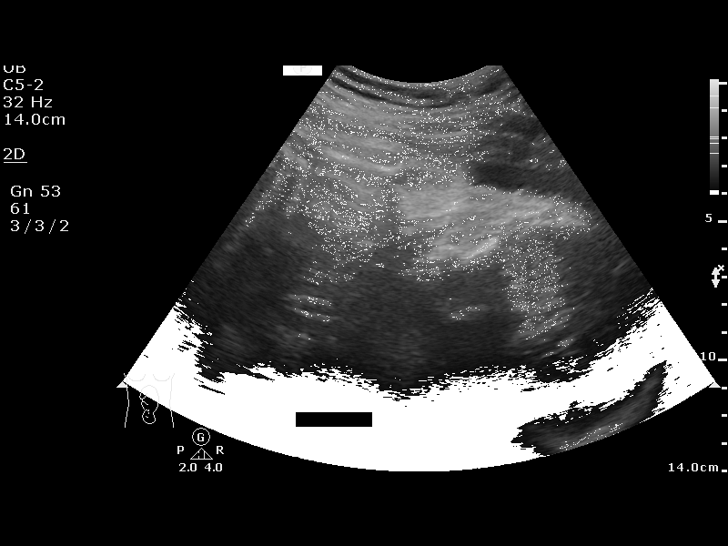
[im 2/13]
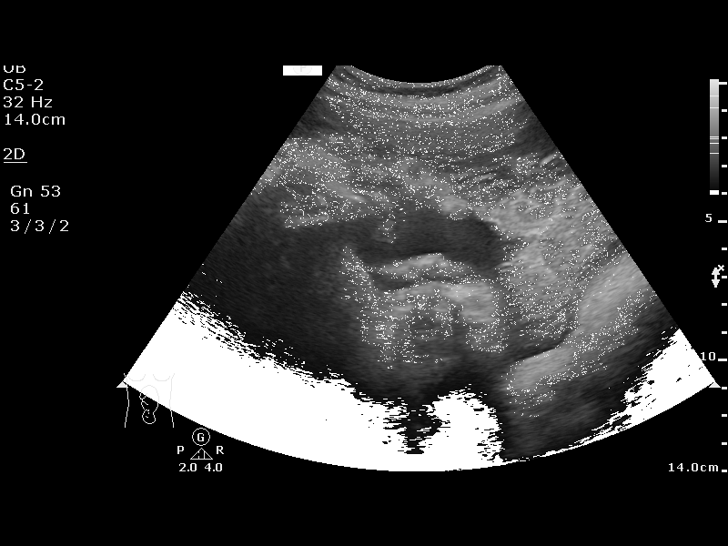
[im 3/13]
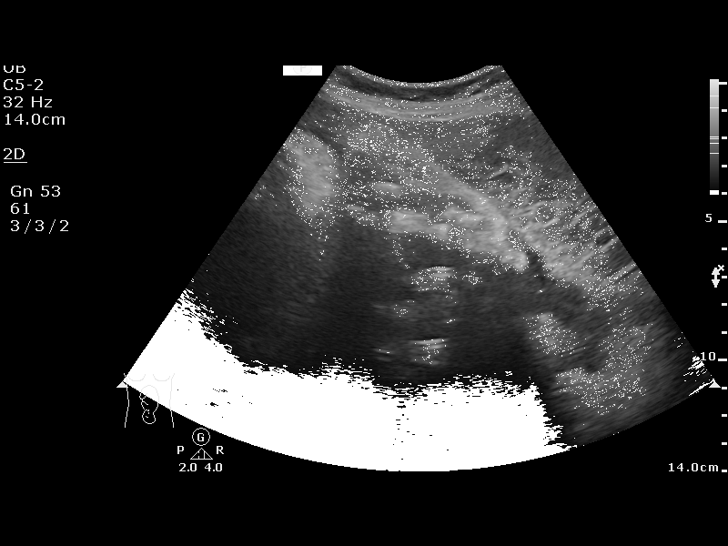
[im 4/13]
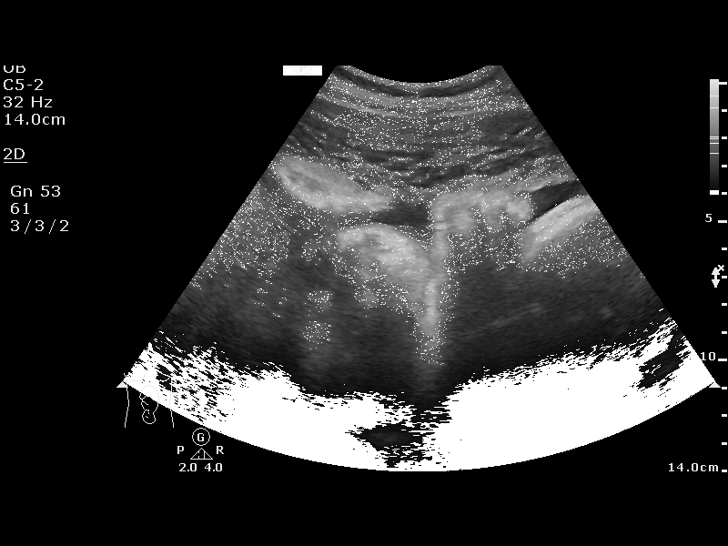
[im 5/13]
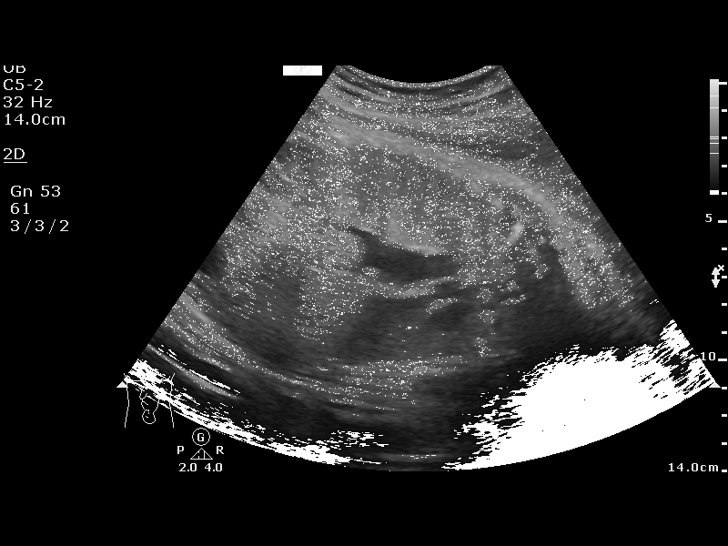
[im 6/13]
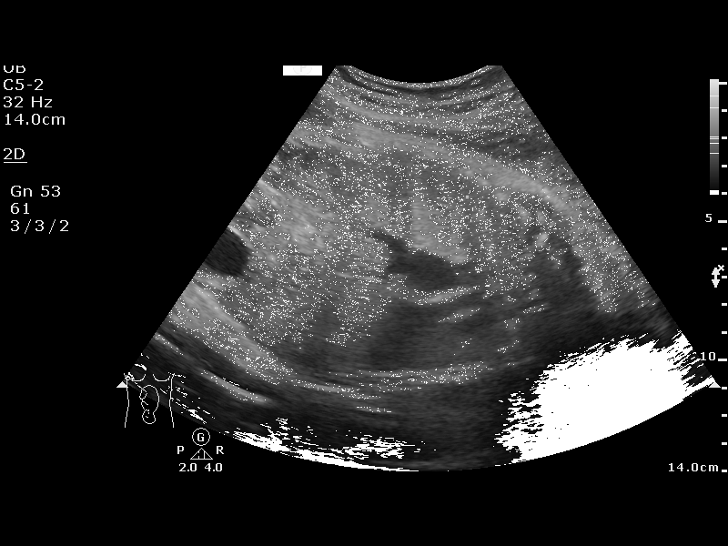
[im 7/13]
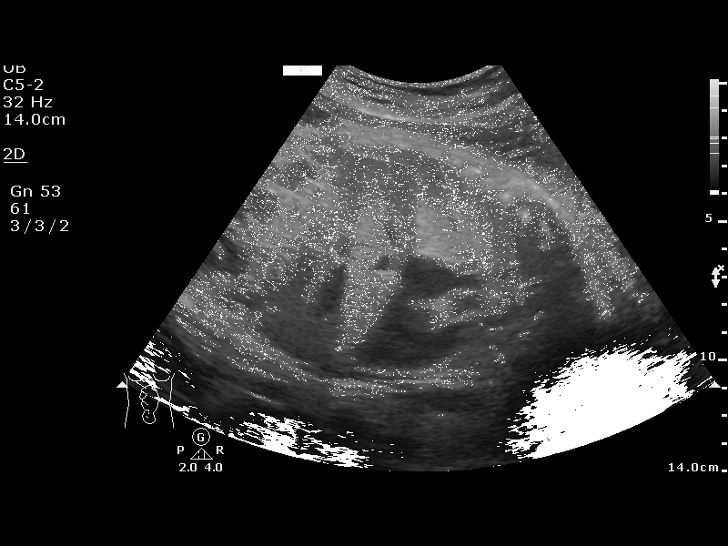
[im 8/13]
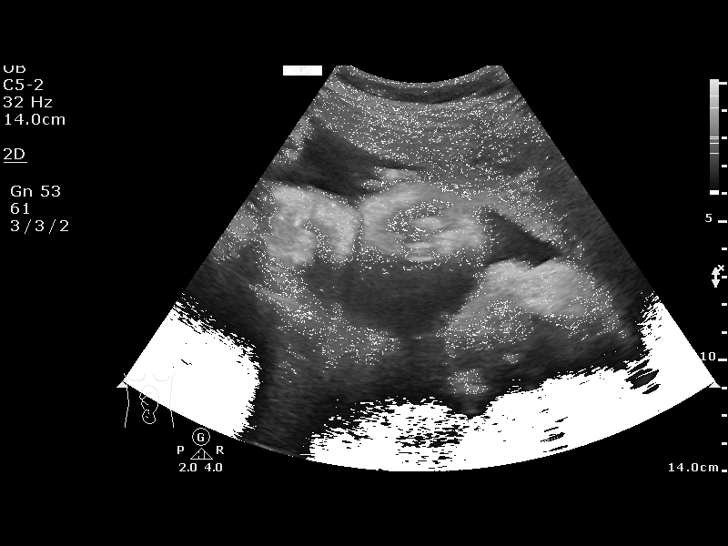
[im 9/13]
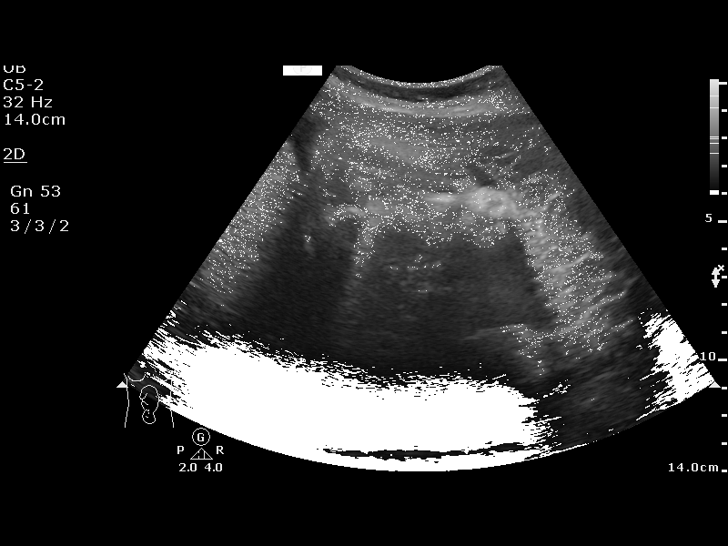
[im 10/13]
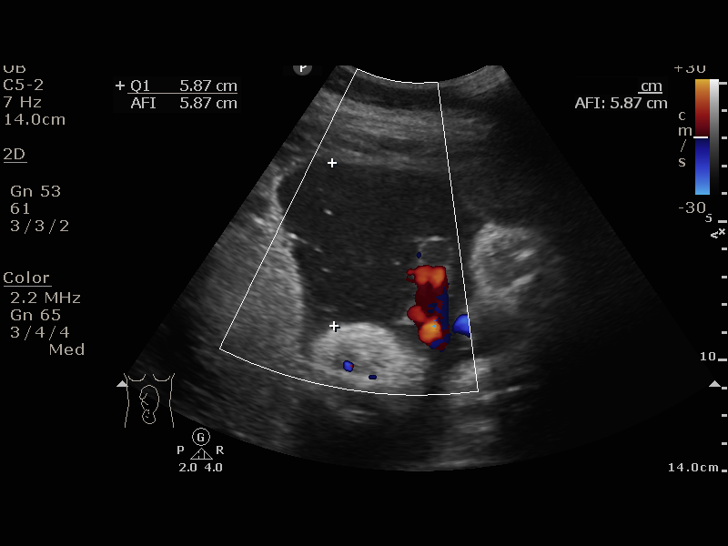
[im 11/13]
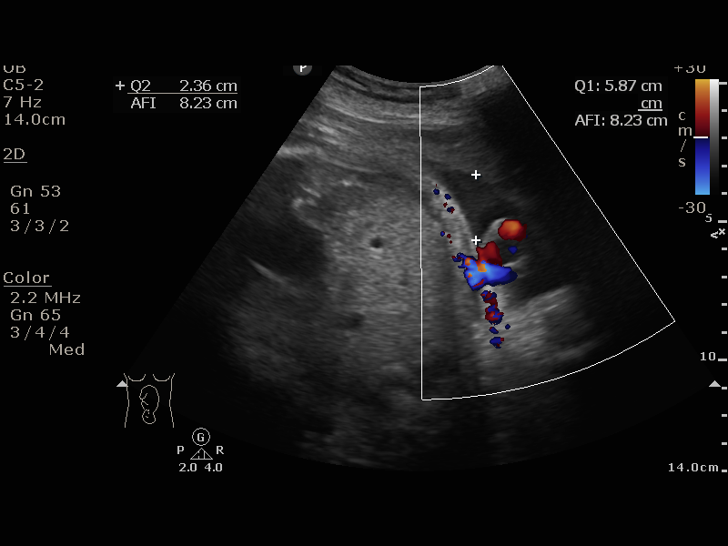
[im 12/13]
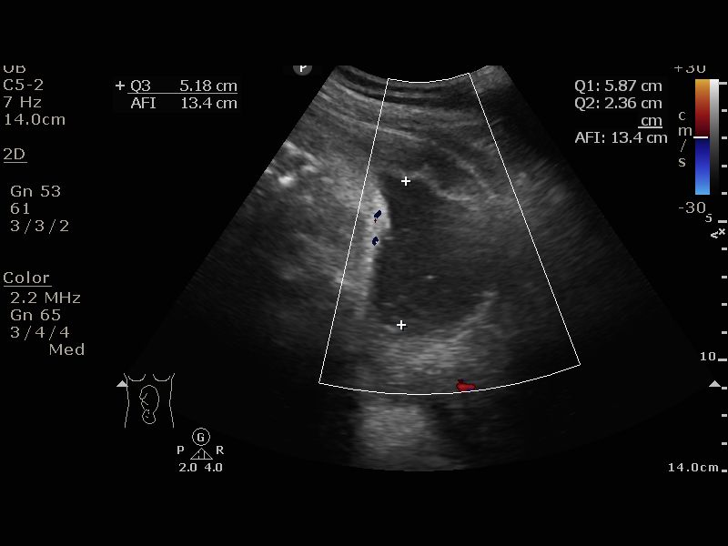
[im 13/13]
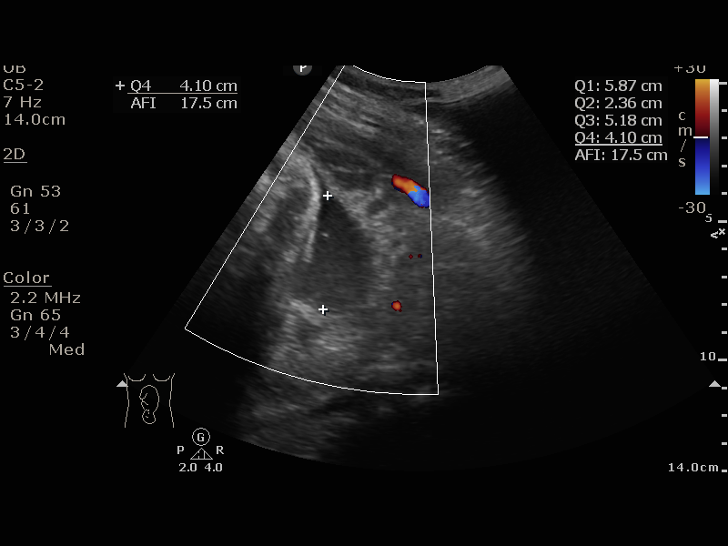

[13 of 13 positions shown; findings below may reference images not displayed]

OB/Gyn Clinic
Attending:        Danah Armentrout         [facility]
[REDACTED]

1  US FETAL BPP W/NONSTRESS                    76818.4

1  JOIE RAYAS           [id number]      [id number]     [id number]
Service(s) Provided

Indications

35 weeks gestation of pregnancy
Unspecified pre-existing hypertension
complicating pregnancy, third trimester
OB History

Blood Type:            Height:  5'6"   Weight (lb):  218       BMI:
Gravidity:    3         Term:   2        Prem:   0        SAB:   0
TOP:          0       Ectopic:  0        Living: 2
Fetal Evaluation

Num Of Fetuses:     1
Preg. Location:     Intrauterine
Cardiac Activity:   Observed
Presentation:       Cephalic

Amniotic Fluid
AFI FV:      Subjectively within normal limits

AFI Sum(cm)     %Tile       Largest Pocket(cm)
17.51           65
RUQ(cm)       RLQ(cm)       LUQ(cm)        LLQ(cm)
5.87

Comment:    Pt taken to [facility] for extended EFM due to FHR decels during
NST
Biophysical Evaluation

Amniotic F.V:   Pocket => 2 cm two         F. Tone:        Observed
planes
F. Movement:    Observed                   N.S.T:          Nonreactive
F. Breathing:   Observed                   Score:          [DATE]
Gestational Age

LMP:           35w 3d        Date:  08/20/16                 EDD:   05/27/17
Best:          35w 3d     Det. By:  LMP  (08/20/16)          EDD:   05/27/17
Impression

IUP at 39w3d
Normal amniotic fluid volume
BPP [DATE] (NRNST)
Recommendations

Sent to [facility] for further monitoring due to FHR decels.
Continue antenatal testing and serial ultrasound for fetal
growth.

## 2020-08-23 ENCOUNTER — Ambulatory Visit: Payer: Medicaid Other | Admitting: Family Medicine

## 2020-09-27 ENCOUNTER — Ambulatory Visit: Payer: Medicaid Other | Admitting: Family Medicine

## 2020-11-01 ENCOUNTER — Ambulatory Visit: Payer: Self-pay | Attending: Family Medicine | Admitting: Family Medicine

## 2020-11-01 ENCOUNTER — Encounter: Payer: Self-pay | Admitting: Family Medicine

## 2020-11-01 ENCOUNTER — Other Ambulatory Visit: Payer: Self-pay

## 2020-11-01 VITALS — BP 161/117 | HR 104 | Ht 67.0 in | Wt 231.0 lb

## 2020-11-01 DIAGNOSIS — I10 Essential (primary) hypertension: Secondary | ICD-10-CM

## 2020-11-01 DIAGNOSIS — Z6836 Body mass index (BMI) 36.0-36.9, adult: Secondary | ICD-10-CM

## 2020-11-01 MED ORDER — AMLODIPINE BESYLATE 10 MG PO TABS: 10.0000 mg | ORAL_TABLET | Freq: Every day | ORAL | 6 refills | Status: DC

## 2020-11-01 NOTE — Patient Instructions (Signed)
https://www.nhlbi.nih.gov/files/docs/public/heart/dash_brief.pdf">  DASH Eating Plan DASH stands for Dietary Approaches to Stop Hypertension. The DASH eating plan is a healthy eating plan that has been shown to: Reduce high blood pressure (hypertension). Reduce your risk for type 2 diabetes, heart disease, and stroke. Help with weight loss. What are tips for following this plan? Reading food labels Check food labels for the amount of salt (sodium) per serving. Choose foods with less than 5 percent of the Daily Value of sodium. Generally, foods with less than 300 milligrams (mg) of sodium per serving fit into this eating plan. To find whole grains, look for the word "whole" as the first word in the ingredient list. Shopping Buy products labeled as "low-sodium" or "no salt added." Buy fresh foods. Avoid canned foods and pre-made or frozen meals. Cooking Avoid adding salt when cooking. Use salt-free seasonings or herbs instead of table salt or sea salt. Check with your health care provider or pharmacist before using salt substitutes. Do not fry foods. Cook foods using healthy methods such as baking, boiling, grilling, roasting, and broiling instead. Cook with heart-healthy oils, such as olive, canola, avocado, soybean, or sunflower oil. Meal planning  Eat a balanced diet that includes: 4 or more servings of fruits and 4 or more servings of vegetables each day. Try to fill one-half of your plate with fruits and vegetables. 6-8 servings of whole grains each day. Less than 6 oz (170 g) of lean meat, poultry, or fish each day. A 3-oz (85-g) serving of meat is about the same size as a deck of cards. One egg equals 1 oz (28 g). 2-3 servings of low-fat dairy each day. One serving is 1 cup (237 mL). 1 serving of nuts, seeds, or beans 5 times each week. 2-3 servings of heart-healthy fats. Healthy fats called omega-3 fatty acids are found in foods such as walnuts, flaxseeds, fortified milks, and eggs.  These fats are also found in cold-water fish, such as sardines, salmon, and mackerel. Limit how much you eat of: Canned or prepackaged foods. Food that is high in trans fat, such as some fried foods. Food that is high in saturated fat, such as fatty meat. Desserts and other sweets, sugary drinks, and other foods with added sugar. Full-fat dairy products. Do not salt foods before eating. Do not eat more than 4 egg yolks a week. Try to eat at least 2 vegetarian meals a week. Eat more home-cooked food and less restaurant, buffet, and fast food.  Lifestyle When eating at a restaurant, ask that your food be prepared with less salt or no salt, if possible. If you drink alcohol: Limit how much you use to: 0-1 drink a day for women who are not pregnant. 0-2 drinks a day for men. Be aware of how much alcohol is in your drink. In the [place] one drink equals one 12 oz bottle of beer (355 mL), one 5 oz glass of wine (148 mL), or one 1 oz glass of hard liquor (44 mL). General information Avoid eating more than 2,300 mg of salt a day. If you have hypertension, you may need to reduce your sodium intake to 1,500 mg a day. Work with your health care provider to maintain a healthy body weight or to lose weight. Ask what an ideal weight is for you. Get at least 30 minutes of exercise that causes your heart to beat faster (aerobic exercise) most days of the week. Activities may include walking, swimming, or biking. Work with your health care provider   or dietitian to adjust your eating plan to your individual calorie needs. What foods should I eat? Fruits All fresh, dried, or frozen fruit. Canned fruit in natural juice (without addedsugar). Vegetables Fresh or frozen vegetables (raw, steamed, roasted, or grilled). Low-sodium or reduced-sodium tomato and vegetable juice. Low-sodium or reduced-sodium tomatosauce and tomato paste. Low-sodium or reduced-sodium canned vegetables. Grains Whole-grain or  whole-wheat bread. Whole-grain or whole-wheat pasta. Brown rice. Oatmeal. Quinoa. Bulgur. Whole-grain and low-sodium cereals. Pita bread.Low-fat, low-sodium crackers. Whole-wheat flour tortillas. Meats and other proteins Skinless chicken or turkey. Ground chicken or turkey. Pork with fat trimmed off. Fish and seafood. Egg whites. Dried beans, peas, or lentils. Unsalted nuts, nut butters, and seeds. Unsalted canned beans. Lean cuts of beef with fat trimmed off. Low-sodium, lean precooked or cured meat, such as sausages or meatloaves. Dairy Low-fat (1%) or fat-free (skim) milk. Reduced-fat, low-fat, or fat-free cheeses. Nonfat, low-sodium ricotta or cottage cheese. Low-fat or nonfatyogurt. Low-fat, low-sodium cheese. Fats and oils Soft margarine without trans fats. Vegetable oil. Reduced-fat, low-fat, or light mayonnaise and salad dressings (reduced-sodium). Canola, safflower, olive, avocado, soybean, andsunflower oils. Avocado. Seasonings and condiments Herbs. Spices. Seasoning mixes without salt. Other foods Unsalted popcorn and pretzels. Fat-free sweets. The items listed above may not be a complete list of foods and beverages you can eat. Contact a dietitian for more information. What foods should I avoid? Fruits Canned fruit in a light or heavy syrup. Fried fruit. Fruit in cream or buttersauce. Vegetables Creamed or fried vegetables. Vegetables in a cheese sauce. Regular canned vegetables (not low-sodium or reduced-sodium). Regular canned tomato sauce and paste (not low-sodium or reduced-sodium). Regular tomato and vegetable juice(not low-sodium or reduced-sodium). Pickles. Olives. Grains Baked goods made with fat, such as croissants, muffins, or some breads. Drypasta or rice meal packs. Meats and other proteins Fatty cuts of meat. Ribs. Fried meat. Bacon. Bologna, salami, and other precooked or cured meats, such as sausages or meat loaves. Fat from the back of a pig (fatback). Bratwurst.  Salted nuts and seeds. Canned beans with added salt. Canned orsmoked fish. Whole eggs or egg yolks. Chicken or turkey with skin. Dairy Whole or 2% milk, cream, and half-and-half. Whole or full-fat cream cheese. Whole-fat or sweetened yogurt. Full-fat cheese. Nondairy creamers. Whippedtoppings. Processed cheese and cheese spreads. Fats and oils Butter. Stick margarine. Lard. Shortening. Ghee. Bacon fat. Tropical oils, suchas coconut, palm kernel, or palm oil. Seasonings and condiments Onion salt, garlic salt, seasoned salt, table salt, and sea salt. Worcestershire sauce. Tartar sauce. Barbecue sauce. Teriyaki sauce. Soy sauce, including reduced-sodium. Steak sauce. Canned and packaged gravies. Fish sauce. Oyster sauce. Cocktail sauce. Store-bought horseradish. Ketchup. Mustard. Meat flavorings and tenderizers. Bouillon cubes. Hot sauces. Pre-made or packaged marinades. Pre-made or packaged taco seasonings. Relishes. Regular saladdressings. Other foods Salted popcorn and pretzels. The items listed above may not be a complete list of foods and beverages you should avoid. Contact a dietitian for more information. Where to find more information National Heart, Lung, and Blood Institute: www.nhlbi.nih.gov American Heart Association: www.heart.org Academy of Nutrition and Dietetics: www.eatright.org National Kidney Foundation: www.kidney.org Summary The DASH eating plan is a healthy eating plan that has been shown to reduce high blood pressure (hypertension). It may also reduce your risk for type 2 diabetes, heart disease, and stroke. When on the DASH eating plan, aim to eat more fresh fruits and vegetables, whole grains, lean proteins, low-fat dairy, and heart-healthy fats. With the DASH eating plan, you should limit salt (sodium) intake to 2,300   mg a day. If you have hypertension, you may need to reduce your sodium intake to 1,500 mg a day. Work with your health care provider or dietitian to adjust  your eating plan to your individual calorie needs. This information is not intended to replace advice given to you by your health care provider. Make sure you discuss any questions you have with your healthcare provider. Document Revised: 04/11/2019 Document Reviewed: 04/11/2019 Elsevier Patient Education  2022 Elsevier Inc.  

## 2020-11-01 NOTE — Progress Notes (Signed)
Subjective:  Patient ID: Ann Robbins, female    DOB: February 25, 1991  Age: 30 y.o. MRN: [id number]  CC: New Patient (Initial Visit)   HPI Ann Robbins is a 30 year old female with history of hypertension who presents today to establish care.    Interval History: Her blood pressure is elevated and she has been having headaches.  She does have a history of hypertension in pregnancy and was on antihypertensives in the past but ever since she had her baby 3 years ago she has not been on any antihypertensive. Does not exercise regularly and is noncompliant with a low-sodium diet.  She has no additional concerns today. Past Medical History:  Diagnosis Date   Hypertension 2015   First with PIH and bp remained high after delivery    Past Surgical History:  Procedure Laterality Date   CHOLECYSTECTOMY  02/2015   Laparoscopic   TUBAL LIGATION Bilateral 05/21/2017   Procedure: POST PARTUM TUBAL LIGATION;  Surgeon: Woodroe Mode, MD;  Location: [facility];  Service: Gynecology;  Laterality: Bilateral;    Family History  Problem Relation Age of Onset   Hypertension Mother     No Known Allergies  Outpatient Medications Prior to Visit  Medication Sig Dispense Refill   hydrochlorothiazide (HYDRODIURIL) 25 MG tablet Take 1 tablet (25 mg total) by mouth daily. (Patient not taking: Reported on 11/01/2020) 30 tablet 0   prenatal vitamin w/FE, FA (PRENATAL 1 + 1) 27-1 MG TABS tablet Take 1 tablet by mouth daily (Patient not taking: Reported on 11/01/2020) 30 each 12   amLODipine (NORVASC) 10 MG tablet Take 1 tablet (10 mg total) by mouth daily. (Patient not taking: Reported on 11/01/2020) 30 tablet 0   No facility-administered medications prior to visit.     ROS Review of Systems  Constitutional:  Negative for activity change, appetite change and fatigue.  HENT:  Negative for congestion, sinus pressure and sore throat.   Eyes:  Negative for visual disturbance.  Respiratory:  Negative for  cough, chest tightness, shortness of breath and wheezing.   Cardiovascular:  Negative for chest pain and palpitations.  Gastrointestinal:  Negative for abdominal distention, abdominal pain and constipation.  Endocrine: Negative for polydipsia.  Genitourinary:  Negative for dysuria and frequency.  Musculoskeletal:  Negative for arthralgias and back pain.  Skin:  Negative for rash.  Neurological:  Positive for headaches. Negative for tremors, light-headedness and numbness.  Hematological:  Does not bruise/bleed easily.  Psychiatric/Behavioral:  Negative for agitation and behavioral problems.    Objective:  BP (!) 161/117   Pulse (!) 104   Ht 5' 7" (1.702 m)   Wt 231 lb (104.8 kg)   SpO2 100%   BMI 36.18 kg/m   BP/Weight 11/01/2020 07/29/2018 2/70/3500  Systolic BP 938 182 993  Diastolic BP 716 88 82  Wt. (Lbs) 231 - 202  BMI 36.18 - 31.64      Physical Exam Constitutional:      Appearance: She is well-developed.  Neck:     Vascular: No JVD.  Cardiovascular:     Rate and Rhythm: Tachycardia present.     Heart sounds: Normal heart sounds. No murmur heard. Pulmonary:     Effort: Pulmonary effort is normal.     Breath sounds: Normal breath sounds. No wheezing or rales.  Chest:     Chest wall: No tenderness.  Abdominal:     General: Bowel sounds are normal. There is no distension.     Palpations: Abdomen is soft. There  is no mass.     Tenderness: There is no abdominal tenderness.  Musculoskeletal:        General: Normal range of motion.     Right lower leg: No edema.     Left lower leg: No edema.  Neurological:     Mental Status: She is alert and oriented to person, place, and time.  Psychiatric:        Mood and Affect: Mood normal.    No flowsheet data found.  Lipid Panel  No results found for: CHOL, TRIG, HDL, CHOLHDL, VLDL, LDLCALC, LDLDIRECT  CBC    Component Value Date/Time   WBC 11.0 (H) 05/20/2017 2023   RBC 3.39 (L) 05/20/2017 2023   HGB 9.8 (L)  05/20/2017 2023   HGB 10.5 (L) 02/22/2017 0924   HGB 11.0 11/13/2016 0000   HCT 29.6 (L) 05/20/2017 2023   HCT 32.2 (L) 02/22/2017 0924   HCT 33 11/13/2016 0000   PLT 226 05/20/2017 2023   PLT 307 02/22/2017 0924   PLT 299 11/13/2016 0000   MCV 87.3 05/20/2017 2023   MCV 93 02/22/2017 0924   MCH 28.9 05/20/2017 2023   MCHC 33.1 05/20/2017 2023   RDW 14.2 05/20/2017 2023   RDW 15.3 02/22/2017 0924    No results found for: HGBA1C  Assessment & Plan:  1. Essential hypertension Uncontrolled Will initiate amlodipine and reassess at next office visit Counseled on blood pressure goal of less than 130/80, low-sodium, DASH diet, medication compliance, 150 minutes of moderate intensity exercise per week. Discussed medication compliance, adverse effects. - amLODipine (NORVASC) 10 MG tablet; Take 1 tablet (10 mg total) by mouth daily.  Dispense: 30 tablet; Refill: 6 - CMP14+EGFR  2. Class 2 severe obesity due to excess calories with serious comorbidity and body mass index (BMI) of 36.0 to 36.9 in adult (HCC) Counseled on reducing portion sizes with a goal of weight loss as this will help her blood pressure control.    Meds ordered this encounter  Medications   amLODipine (NORVASC) 10 MG tablet    Sig: Take 1 tablet (10 mg total) by mouth daily.    Dispense:  30 tablet    Refill:  6    Follow-up: Return in about 6 weeks (around 12/13/2020) for Hypertension.       Enobong Newlin, MD, FAAFP. [facility] [place], [place] [phone]   11/01/2020, 4:47 PM 

## 2020-11-01 NOTE — Progress Notes (Signed)
No bp medication in years.

## 2020-11-02 LAB — CMP14+EGFR
ALT: 23 IU/L (ref 0–32)
AST: 19 IU/L (ref 0–40)
Albumin/Globulin Ratio: 1.3 (ref 1.2–2.2)
Albumin: 4.3 g/dL (ref 3.8–4.8)
Alkaline Phosphatase: 97 IU/L (ref 44–121)
BUN/Creatinine Ratio: 9 (ref 9–23)
BUN: 9 mg/dL (ref 6–20)
Bilirubin Total: 0.8 mg/dL (ref 0.0–1.2)
CO2: 26 mmol/L (ref 20–29)
Calcium: 9.1 mg/dL (ref 8.7–10.2)
Chloride: 104 mmol/L (ref 96–106)
Creatinine, Ser: 0.98 mg/dL (ref 0.57–1.00)
Globulin, Total: 3.3 g/dL (ref 1.5–4.5)
Glucose: 80 mg/dL (ref 65–99)
Potassium: 4 mmol/L (ref 3.5–5.2)
Sodium: 141 mmol/L (ref 134–144)
Total Protein: 7.6 g/dL (ref 6.0–8.5)
eGFR: 79 mL/min/{1.73_m2} (ref >59)

## 2020-12-14 ENCOUNTER — Other Ambulatory Visit: Payer: Self-pay

## 2020-12-14 ENCOUNTER — Encounter: Payer: Self-pay | Admitting: Family Medicine

## 2020-12-14 ENCOUNTER — Ambulatory Visit: Payer: Self-pay | Attending: Family Medicine | Admitting: Family Medicine

## 2020-12-14 VITALS — BP 166/117 | HR 102 | Ht 67.0 in | Wt 229.2 lb

## 2020-12-14 DIAGNOSIS — Z6835 Body mass index (BMI) 35.0-35.9, adult: Secondary | ICD-10-CM | POA: Insufficient documentation

## 2020-12-14 DIAGNOSIS — Z79899 Other long term (current) drug therapy: Secondary | ICD-10-CM | POA: Insufficient documentation

## 2020-12-14 DIAGNOSIS — Z8249 Family history of ischemic heart disease and other diseases of the circulatory system: Secondary | ICD-10-CM | POA: Insufficient documentation

## 2020-12-14 DIAGNOSIS — Z713 Dietary counseling and surveillance: Secondary | ICD-10-CM | POA: Insufficient documentation

## 2020-12-14 DIAGNOSIS — I10 Essential (primary) hypertension: Secondary | ICD-10-CM | POA: Insufficient documentation

## 2020-12-14 MED ORDER — HYDROCHLOROTHIAZIDE 25 MG PO TABS: 25.0000 mg | ORAL_TABLET | Freq: Every day | ORAL | 1 refills | Status: DC

## 2020-12-14 NOTE — Patient Instructions (Signed)

## 2020-12-14 NOTE — Progress Notes (Signed)
Subjective:  Patient ID: Ann Robbins, female    DOB: [birth year]  Age: 30 y.o. MRN: [id number]  CC: Hypertension   HPI Ann Robbins is a 30 year old female with history of hypertension who presents today for follow-up visit.  Interval History: Endorses compliance with amlodipine and recently started a walking program at home but her blood pressure is still elevated at 166/117.  She has no means of checking her blood pressure at home. Denies acute concerns today. Would love to have a Pap smear at her next visit.  Past Medical History:  Diagnosis Date   Hypertension 2015   First with PIH and bp remained high after delivery    Past Surgical History:  Procedure Laterality Date   CHOLECYSTECTOMY  02/2015   Laparoscopic   TUBAL LIGATION Bilateral 05/21/2017   Procedure: POST PARTUM TUBAL LIGATION;  Surgeon: Adam Phenix, MD;  Location: [facility];  Service: Gynecology;  Laterality: Bilateral;    Family History  Problem Relation Age of Onset   Hypertension Mother     No Known Allergies  Outpatient Medications Prior to Visit  Medication Sig Dispense Refill   amLODipine (NORVASC) 10 MG tablet Take 1 tablet (10 mg total) by mouth daily. 30 tablet 6   prenatal vitamin w/FE, FA (PRENATAL 1 + 1) 27-1 MG TABS tablet Take 1 tablet by mouth daily (Patient not taking: No sig reported) 30 each 12   hydrochlorothiazide (HYDRODIURIL) 25 MG tablet Take 1 tablet (25 mg total) by mouth daily. (Patient not taking: No sig reported) 30 tablet 0   No facility-administered medications prior to visit.     ROS Review of Systems  Constitutional:  Negative for activity change, appetite change and fatigue.  HENT:  Negative for congestion, sinus pressure and sore throat.   Eyes:  Negative for visual disturbance.  Respiratory:  Negative for cough, chest tightness, shortness of breath and wheezing.   Cardiovascular:  Negative for chest pain and palpitations.  Gastrointestinal:  Negative for  abdominal distention, abdominal pain and constipation.  Endocrine: Negative for polydipsia.  Genitourinary:  Negative for dysuria and frequency.  Musculoskeletal:  Negative for arthralgias and back pain.  Skin:  Negative for rash.  Neurological:  Negative for tremors, light-headedness and numbness.  Hematological:  Does not bruise/bleed easily.  Psychiatric/Behavioral:  Negative for agitation and behavioral problems.    Objective:  BP (!) 166/117   Pulse (!) 102   Ht 5\' 7"  (1.702 m)   Wt 229 lb 3.2 oz (104 kg)   SpO2 100%   BMI 35.90 kg/m   BP/Weight 12/14/2020 11/01/2020 07/29/2018  Systolic BP 166 161 135  Diastolic BP 117 117 88  Wt. (Lbs) 229.2 231 -  BMI 35.9 36.18 -      Physical Exam Constitutional:      Appearance: She is well-developed.  Cardiovascular:     Rate and Rhythm: Normal rate.     Heart sounds: Normal heart sounds. No murmur heard. Pulmonary:     Effort: Pulmonary effort is normal.     Breath sounds: Normal breath sounds. No wheezing or rales.  Chest:     Chest wall: No tenderness.  Abdominal:     General: Bowel sounds are normal. There is no distension.     Palpations: Abdomen is soft. There is no mass.     Tenderness: There is no abdominal tenderness.  Musculoskeletal:        General: Normal range of motion.     Right lower leg: No  edema.     Left lower leg: No edema.  Neurological:     Mental Status: She is alert and oriented to person, place, and time.  Psychiatric:        Mood and Affect: Mood normal.    CMP Latest Ref Rng & Units 11/01/2020  Glucose 65 - 99 mg/dL 80  BUN 6 - 20 mg/dL 9  Creatinine 1.61 - 0.96 mg/dL 0.45  Sodium 409 - 811 mmol/L 141  Potassium 3.5 - 5.2 mmol/L 4.0  Chloride 96 - 106 mmol/L 104  CO2 20 - 29 mmol/L 26  Calcium 8.7 - 10.2 mg/dL 9.1  Total Protein 6.0 - 8.5 g/dL 7.6  Total Bilirubin 0.0 - 1.2 mg/dL 0.8  Alkaline Phos 44 - 121 IU/L 97  AST 0 - 40 IU/L 19  ALT 0 - 32 IU/L 23     CBC    Component  Value Date/Time   WBC 11.0 (H) 05/20/2017 2023   RBC 3.39 (L) 05/20/2017 2023   HGB 9.8 (L) 05/20/2017 2023   HGB 10.5 (L) 02/22/2017 0924   HGB 11.0 11/13/2016 0000   HCT 29.6 (L) 05/20/2017 2023   HCT 32.2 (L) 02/22/2017 0924   HCT 33 11/13/2016 0000   PLT 226 05/20/2017 2023   PLT 307 02/22/2017 0924   PLT 299 11/13/2016 0000   MCV 87.3 05/20/2017 2023   MCV 93 02/22/2017 0924   MCH 28.9 05/20/2017 2023   MCHC 33.1 05/20/2017 2023   RDW 14.2 05/20/2017 2023   RDW 15.3 02/22/2017 0924      Assessment & Plan:  1. Essential hypertension Uncontrolled Continue amlodipine Will initiate hydrochlorothiazide If blood pressure is above goal at next visit consider switching HCTZ to HCTZ/ARB Counseled on blood pressure goal of less than 130/80, low-sodium, DASH diet, medication compliance, 150 minutes of moderate intensity exercise per week. Discussed medication compliance, adverse effects. - hydrochlorothiazide (HYDRODIURIL) 25 MG tablet; Take 1 tablet (25 mg total) by mouth daily.  Dispense: 30 tablet; Refill: 1  2. Class 2 severe obesity due to excess calories with serious comorbidity and body mass index (BMI) of 35.0 to 35.9 in adult [facility]) Commended on new exercise routine Advised to continue with caloric restriction, exercise   Health Care Maintenance: Pap smear next visit, consider screening for diabetes Meds ordered this encounter  Medications   hydrochlorothiazide (HYDRODIURIL) 25 MG tablet    Sig: Take 1 tablet (25 mg total) by mouth daily.    Dispense:  30 tablet    Refill:  1    Follow-up: Return for PAP smear at next visit.       Hoy Register, MD, FAAFP. [facility] [place], [place] [phone]   12/14/2020, 9:16 AM

## 2020-12-14 NOTE — Progress Notes (Signed)
Wants to get pap smear next appointment.

## 2021-01-20 ENCOUNTER — Ambulatory Visit: Payer: Medicaid Other | Admitting: Family Medicine

## 2021-03-25 ENCOUNTER — Encounter (HOSPITAL_COMMUNITY): Payer: Self-pay

## 2021-03-25 ENCOUNTER — Other Ambulatory Visit: Payer: Self-pay

## 2021-03-25 ENCOUNTER — Ambulatory Visit (HOSPITAL_COMMUNITY)
Admission: EM | Admit: 2021-03-25 | Discharge: 2021-03-25 | Disposition: A | Discharge status: Home / Self Care | Payer: Medicaid Other | Attending: Physician Assistant | Admitting: Physician Assistant

## 2021-03-25 DIAGNOSIS — J029 Acute pharyngitis, unspecified: Secondary | ICD-10-CM

## 2021-03-25 DIAGNOSIS — J069 Acute upper respiratory infection, unspecified: Secondary | ICD-10-CM

## 2021-03-25 DIAGNOSIS — R051 Acute cough: Secondary | ICD-10-CM | POA: Insufficient documentation

## 2021-03-25 DIAGNOSIS — R0981 Nasal congestion: Secondary | ICD-10-CM

## 2021-03-25 DIAGNOSIS — Z20822 Contact with and (suspected) exposure to covid-19: Secondary | ICD-10-CM | POA: Insufficient documentation

## 2021-03-25 LAB — SARS CORONAVIRUS 2 (TAT 6-24 HRS): SARS Coronavirus 2: NEGATIVE

## 2021-03-25 MED ORDER — PROMETHAZINE-DM 6.25-15 MG/5ML PO SYRP: 5.0000 mL | ORAL_SOLUTION | Freq: Three times a day (TID) | ORAL | 0 refills | Status: DC | PRN

## 2021-03-25 NOTE — ED Provider Notes (Signed)
[facility]    CSN: [id number] Arrival date & time: 03/25/21  9628      History   Chief Complaint Chief Complaint  Patient presents with   Sore Throat    HPI Ann Robbins is a 30 y.o. female.   Patient presents today with a 4-day history of URI symptoms.  Reports coughing, chills, sore throat, fatigue, nasal congestion.  Denies any fever, chest pain, shortness of breath, nausea, vomiting.  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  She denies history of allergies, asthma, smoking, COPD.  She has tried over-the-counter medications without improvement of symptoms.  Denies any recent antibiotic use.   Past Medical History:  Diagnosis Date   Hypertension 2015   First with PIH and bp remained high after delivery    Patient Active Problem List   Diagnosis Date Noted   Obesity 07/09/2016   Dental decay 02/26/2016   Essential hypertension 02/25/2016   History of cervical dysplasia 08/29/2013    Past Surgical History:  Procedure Laterality Date   CHOLECYSTECTOMY  02/2015   Laparoscopic   TUBAL LIGATION Bilateral 05/21/2017   Procedure: POST PARTUM TUBAL LIGATION;  Surgeon: Adam Phenix, MD;  Location: [facility];  Service: Gynecology;  Laterality: Bilateral;    OB History     Gravida  3   Para  3   Term  3   Preterm  0   AB  0   Living  3      SAB  0   IAB      Ectopic  0   Multiple  0   Live Births  3            Home Medications    Prior to Admission medications   Medication Sig Start Date End Date Taking? Authorizing Provider  promethazine-dextromethorphan (PROMETHAZINE-DM) 6.25-15 MG/5ML syrup Take 5 mLs by mouth 3 (three) times daily as needed for cough. 03/25/21  Yes Dantonio Justen K, PA-C  amLODipine (NORVASC) 10 MG tablet Take 1 tablet (10 mg total) by mouth daily. 11/01/20   Hoy Register, MD  hydrochlorothiazide (HYDRODIURIL) 25 MG tablet Take  1 tablet (25 mg total) by mouth daily. 12/14/20   Hoy Register, MD  prenatal vitamin w/FE, FA (PRENATAL 1 + 1) 27-1 MG TABS tablet Take 1 tablet by mouth daily Patient not taking: No sig reported 02/19/17   Anyanwu, Jethro Bastos, MD    Family History Family History  Problem Relation Age of Onset   Hypertension Mother     Social History Social History   Tobacco Use   Smoking status: Never   Smokeless tobacco: Never  Substance Use Topics   Alcohol use: No   Drug use: No     Allergies   Patient has no allergy information on record.   Review of Systems Review of Systems  Constitutional:  Positive for activity change and chills. Negative for appetite change, fatigue and fever.  HENT:  Positive for congestion and sore throat. Negative for sinus pressure and sneezing.   Respiratory:  Positive for cough. Negative for shortness of breath.   Cardiovascular:  Negative for chest pain.  Gastrointestinal:  Negative for abdominal pain, diarrhea, nausea and vomiting.  Neurological:  Negative for dizziness, light-headedness and headaches.    Physical Exam Triage Vital Signs ED Triage Vitals  Enc Vitals Group     BP 03/25/21 0940 (!) 150/115  Pulse Rate 03/25/21 0939 95     Resp 03/25/21 0939 18     Temp 03/25/21 0939 98.4 F (36.9 C)     Temp Source 03/25/21 0939 Oral     SpO2 03/25/21 0939 100 %     Weight --      Height --      Head Circumference --      Peak Flow --      Pain Score 03/25/21 0938 0     Pain Loc --      Pain Edu? --      Excl. in GC? --    No data found.  Updated Vital Signs BP (!) 150/115   Pulse 95   Temp 98.4 F (36.9 C) (Oral)   Resp 18   LMP 03/22/2021 (Exact Date)   SpO2 100%   Visual Acuity Right Eye Distance:   Left Eye Distance:   Bilateral Distance:    Right Eye Near:   Left Eye Near:    Bilateral Near:     Physical Exam Vitals reviewed.  Constitutional:      General: She is awake. She is not in acute distress.    Appearance:  Normal appearance. She is well-developed. She is not ill-appearing.     Comments: Very pleasant female appears stated age in no acute distress sitting comfortably in exam room  HENT:     Head: Normocephalic and atraumatic.     Right Ear: Tympanic membrane, ear canal and external ear normal. Tympanic membrane is not erythematous or bulging.     Left Ear: Tympanic membrane, ear canal and external ear normal. Tympanic membrane is not erythematous or bulging.     Nose:     Right Sinus: No maxillary sinus tenderness or frontal sinus tenderness.     Left Sinus: No maxillary sinus tenderness or frontal sinus tenderness.     Mouth/Throat:     Pharynx: Uvula midline. Posterior oropharyngeal erythema present. No oropharyngeal exudate.  Cardiovascular:     Rate and Rhythm: Normal rate and regular rhythm.     Heart sounds: Normal heart sounds, S1 normal and S2 normal. No murmur heard. Pulmonary:     Effort: Pulmonary effort is normal.     Breath sounds: Normal breath sounds. No wheezing, rhonchi or rales.     Comments: Clear to auscultation bilaterally Psychiatric:        Behavior: Behavior is cooperative.     UC Treatments / Results  Labs (all labs ordered are listed, but only abnormal results are displayed) Labs Reviewed  SARS CORONAVIRUS 2 (TAT 6-24 HRS)    EKG   Radiology No results found.  Procedures Procedures (including critical care time)  Medications Ordered in UC Medications - No data to display  Initial Impression / Assessment and Plan / UC Course  I have reviewed the triage vital signs and the nursing notes.  Pertinent labs & imaging results that were available during my care of the patient were reviewed by me and considered in my medical decision making (see chart for details).     Discussed likely viral etiology given short duration of symptoms.  No indication for flu testing given patient has been symptomatic for more than 72 hours.  COVID testing was  obtained-results pending.  She was provided work excuse note with current CDC return to work guidelines based on COVID test result.  Recommended she use over-the-counter medications including Tylenol, Mucinex, Flonase for symptom relief.  She was prescribed Promethazine DM for  cough with instruction not to drive or drink alcohol while taking this medication as drowsiness is a common side effect.  She is to rest and drink plenty of fluid.  Discussed alarm symptoms that warrant emergent evaluation.  Strict return precautions given to which she expressed understanding.  Final Clinical Impressions(s) / UC Diagnoses   Final diagnoses:  Upper respiratory tract infection, unspecified type  Sore throat  Nasal congestion  Acute cough     Discharge Instructions      Believe that you have a virus.  We will contact you if your COVID test is positive.  Please remain in isolation until you receive these results.  I have called in Promethazine DM for cough.  This can make you sleepy do not drive or drink alcohol while taking it.  You can use Mucinex, Flonase, Tylenol for additional symptom relief.  Make sure you are drinking plenty of fluid.  If you have any worsening symptoms please return for reevaluation.     ED Prescriptions     Medication Sig Dispense Auth. Provider   promethazine-dextromethorphan (PROMETHAZINE-DM) 6.25-15 MG/5ML syrup Take 5 mLs by mouth 3 (three) times daily as needed for cough. 118 mL Deloise Marchant K, PA-C      PDMP not reviewed this encounter.   Jeani Hawking, PA-C 03/25/21 2113

## 2021-03-25 NOTE — Discharge Instructions (Signed)
Believe that you have a virus.  We will contact you if your COVID test is positive.  Please remain in isolation until you receive these results.  I have called in Promethazine DM for cough.  This can make you sleepy do not drive or drink alcohol while taking it.  You can use Mucinex, Flonase, Tylenol for additional symptom relief.  Make sure you are drinking plenty of fluid.  If you have any worsening symptoms please return for reevaluation.

## 2021-03-25 NOTE — ED Triage Notes (Signed)
Pt presents with coughing, chills and sore throat X 5 days.

## 2021-04-12 ENCOUNTER — Ambulatory Visit: Payer: Medicaid Other | Admitting: Family Medicine

## 2022-05-24 ENCOUNTER — Encounter (HOSPITAL_COMMUNITY): Payer: Self-pay | Admitting: Emergency Medicine

## 2022-05-24 ENCOUNTER — Ambulatory Visit (HOSPITAL_COMMUNITY)
Admission: EM | Admit: 2022-05-24 | Discharge: 2022-05-24 | Disposition: A | Discharge status: Home / Self Care | Payer: Self-pay | Attending: Internal Medicine | Admitting: Internal Medicine

## 2022-05-24 DIAGNOSIS — R059 Cough, unspecified: Secondary | ICD-10-CM | POA: Insufficient documentation

## 2022-05-24 DIAGNOSIS — Z1152 Encounter for screening for COVID-19: Secondary | ICD-10-CM | POA: Insufficient documentation

## 2022-05-24 DIAGNOSIS — J069 Acute upper respiratory infection, unspecified: Secondary | ICD-10-CM | POA: Insufficient documentation

## 2022-05-24 DIAGNOSIS — R079 Chest pain, unspecified: Secondary | ICD-10-CM | POA: Insufficient documentation

## 2022-05-24 LAB — POC INFLUENZA A AND B ANTIGEN (URGENT CARE ONLY)
INFLUENZA A ANTIGEN, POC: NEGATIVE
INFLUENZA B ANTIGEN, POC: NEGATIVE

## 2022-05-24 MED ORDER — BENZONATATE 100 MG PO CAPS: 100.0000 mg | ORAL_CAPSULE | Freq: Three times a day (TID) | ORAL | 0 refills | Status: DC

## 2022-05-24 NOTE — ED Triage Notes (Signed)
Pt been sick since last Thursday.. having body aches, cough, diarrhea, can't smell or taste. Cough is dry. Tried all OTC medications without relief.

## 2022-05-24 NOTE — Discharge Instructions (Addendum)

## 2022-05-24 NOTE — ED Provider Notes (Signed)
[facility]   [id number] 05/24/22 Arrival Time: 5681  ASSESSMENT & PLAN:  1. Viral URI with cough    -History and exam is consistent with a viral upper respiratory illness.  I have high suspicion for COVID.  Her point of care flu testing was negative today.  Her COVID test is pending.  Will call if positive.  Will treat her symptomatically with Tessalon pearls for cough.  Recommended vigorous p.o. hydration and rest and continued use of over-the-counter medications such as ibuprofen for aches, and guaifenesin for decongestant.  Work note given.  All questions were answered and she agrees to plan.  Meds ordered this encounter  Medications   benzonatate (TESSALON) 100 MG capsule    Sig: Take 1 capsule (100 mg total) by mouth every 8 (eight) hours.    Dispense:  21 capsule    Refill:  0     Discharge Instructions      Your symptoms today are most likely being caused by a virus and should steadily improve in time it can take up to 7 to 10 days before you truly start to see a turnaround however things will get better    You can take Tylenol and/or Ibuprofen as needed for fever reduction and pain relief.   For cough: honey 1/2 to 1 teaspoon (you can dilute the honey in water or another fluid).  You can also use guaifenesin and dextromethorphan for cough. You can use a humidifier for chest congestion and cough.  If you don't have a humidifier, you can sit in the bathroom with the hot shower running.      For sore throat: try warm salt water gargles, cepacol lozenges, throat spray, warm tea or water with lemon/honey, popsicles or ice, or OTC cold relief medicine for throat discomfort.   For congestion: take a daily anti-histamine like Zyrtec, Claritin, and a oral decongestant, such as pseudoephedrine.  You can also use Flonase 1-2 sprays in each nostril daily.   It is important to stay hydrated: drink plenty of fluids (water, gatorade/powerade/pedialyte, juices, or teas) to keep  your throat moisturized and help further relieve irritation/discomfort.         Reviewed expectations re: course of current medical issues. Questions answered. Outlined signs and symptoms indicating need for more acute intervention. Patient verbalized understanding. After Visit Summary given.   SUBJECTIVE: Pleasant 32 year old female comes urgent care evaluated for cough, body aches, diarrhea, loss of taste and smell.  Symptoms started on Thursday of last week.  She has been experiencing dry cough that is worse at night.  She has been trying over-the-counter medications such as Mucinex without much symptomatic relief.  She has noticed that she cannot taste Hall's cough drops when she takes those.  Unsure of potential exposures.  Denies fever.  Does have associated lower chest pain from coughing so much.  Has not taken a COVID test yet.  Patient's last menstrual period was 05/16/2022. Past Surgical History:  Procedure Laterality Date   CHOLECYSTECTOMY  02/2015   Laparoscopic   TUBAL LIGATION Bilateral 05/21/2017   Procedure: POST PARTUM TUBAL LIGATION;  Surgeon: Woodroe Mode, MD;  Location: [facility];  Service: Gynecology;  Laterality: Bilateral;     OBJECTIVE:  Vitals:   05/24/22 0818  BP: (!) 172/128  Pulse: (!) 102  Resp: 17  Temp: 98.1 F (36.7 C)  TempSrc: Oral  SpO2: 98%     Physical Exam Vitals and nursing note reviewed.  Constitutional:  General: She is not in acute distress.    Appearance: She is not ill-appearing.  HENT:     Right Ear: Tympanic membrane normal.     Left Ear: Tympanic membrane normal.     Nose: Congestion present.     Mouth/Throat:     Pharynx: Posterior oropharyngeal erythema present.  Cardiovascular:     Rate and Rhythm: Tachycardia present.     Heart sounds: Normal heart sounds.  Pulmonary:     Effort: Pulmonary effort is normal. No respiratory distress.     Breath sounds: Normal breath sounds. No wheezing.  Abdominal:      General: Abdomen is flat. Bowel sounds are normal.     Palpations: Abdomen is soft.  Lymphadenopathy:     Cervical: Cervical adenopathy present.  Skin:    General: Skin is warm.  Neurological:     General: No focal deficit present.     Mental Status: She is alert.  Psychiatric:        Mood and Affect: Mood normal.      Labs: Results for orders placed or performed during the hospital encounter of 05/24/22  POC Influenza A & B Ag (Urgent Care)  Result Value Ref Range   INFLUENZA A ANTIGEN, POC NEGATIVE NEGATIVE   INFLUENZA B ANTIGEN, POC NEGATIVE NEGATIVE   Labs Reviewed  SARS CORONAVIRUS 2 (TAT 6-24 HRS)  POC INFLUENZA A AND B ANTIGEN (URGENT CARE ONLY)    Imaging: No results found.   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  Tobacco Use   Smoking status: Never   Smokeless tobacco: Never  Substance and Sexual Activity   Alcohol use: No   Drug use: No   Sexual activity: Yes    Birth control/protection: None  Other Topics Concern   Not on file  Social History Narrative   Originally from [place] in March of [place]   Lives at home with boyfriend, Camila Li and their 2 children   Social Determinants of Health   Financial Resource Strain: Not on file  Food Insecurity: Not on file  Transportation Needs: Not on file  Physical Activity: Not on file  Stress: Not on file  Social Connections: Not on file  Intimate Partner Violence: Not on file    Family History  Problem Relation Age of Onset   Hypertension Mother       Dortha Kern, MD 05/24/22  [phone]

## 2022-05-25 LAB — SARS CORONAVIRUS 2 (TAT 6-24 HRS): SARS Coronavirus 2: NEGATIVE

## 2022-06-29 ENCOUNTER — Emergency Department (HOSPITAL_COMMUNITY): Payer: Medicaid Other

## 2022-06-29 ENCOUNTER — Other Ambulatory Visit: Payer: Self-pay

## 2022-06-29 ENCOUNTER — Observation Stay: Payer: Self-pay

## 2022-06-29 ENCOUNTER — Inpatient Hospital Stay (HOSPITAL_COMMUNITY)
Admission: EM | Admit: 2022-06-29 | Discharge: 2022-07-04 | DRG: 291 | Disposition: A | Discharge status: Home / Self Care | Payer: Self-pay | Attending: Family Medicine | Admitting: Family Medicine

## 2022-06-29 ENCOUNTER — Encounter (HOSPITAL_COMMUNITY): Payer: Self-pay | Admitting: Pharmacy Technician

## 2022-06-29 ENCOUNTER — Observation Stay (HOSPITAL_BASED_OUTPATIENT_CLINIC_OR_DEPARTMENT_OTHER): Payer: Self-pay

## 2022-06-29 ENCOUNTER — Observation Stay (HOSPITAL_COMMUNITY): Payer: Medicaid Other

## 2022-06-29 DIAGNOSIS — I160 Hypertensive urgency: Secondary | ICD-10-CM | POA: Insufficient documentation

## 2022-06-29 DIAGNOSIS — D509 Iron deficiency anemia, unspecified: Secondary | ICD-10-CM | POA: Diagnosis present

## 2022-06-29 DIAGNOSIS — I5023 Acute on chronic systolic (congestive) heart failure: Secondary | ICD-10-CM | POA: Diagnosis present

## 2022-06-29 DIAGNOSIS — R7989 Other specified abnormal findings of blood chemistry: Secondary | ICD-10-CM | POA: Diagnosis present

## 2022-06-29 DIAGNOSIS — I5021 Acute systolic (congestive) heart failure: Secondary | ICD-10-CM | POA: Diagnosis present

## 2022-06-29 DIAGNOSIS — R1011 Right upper quadrant pain: Principal | ICD-10-CM | POA: Diagnosis present

## 2022-06-29 DIAGNOSIS — I509 Heart failure, unspecified: Secondary | ICD-10-CM

## 2022-06-29 DIAGNOSIS — Z8249 Family history of ischemic heart disease and other diseases of the circulatory system: Secondary | ICD-10-CM

## 2022-06-29 DIAGNOSIS — I959 Hypotension, unspecified: Secondary | ICD-10-CM | POA: Diagnosis not present

## 2022-06-29 DIAGNOSIS — R0683 Snoring: Secondary | ICD-10-CM | POA: Diagnosis present

## 2022-06-29 DIAGNOSIS — Z9049 Acquired absence of other specified parts of digestive tract: Secondary | ICD-10-CM

## 2022-06-29 DIAGNOSIS — D649 Anemia, unspecified: Secondary | ICD-10-CM | POA: Diagnosis present

## 2022-06-29 DIAGNOSIS — I3139 Other pericardial effusion (noninflammatory): Secondary | ICD-10-CM | POA: Diagnosis present

## 2022-06-29 DIAGNOSIS — I161 Hypertensive emergency: Secondary | ICD-10-CM | POA: Diagnosis present

## 2022-06-29 DIAGNOSIS — N179 Acute kidney failure, unspecified: Secondary | ICD-10-CM | POA: Diagnosis present

## 2022-06-29 DIAGNOSIS — I110 Hypertensive heart disease with heart failure: Principal | ICD-10-CM | POA: Diagnosis present

## 2022-06-29 DIAGNOSIS — I10 Essential (primary) hypertension: Secondary | ICD-10-CM | POA: Diagnosis present

## 2022-06-29 DIAGNOSIS — Z79899 Other long term (current) drug therapy: Secondary | ICD-10-CM

## 2022-06-29 DIAGNOSIS — I5020 Unspecified systolic (congestive) heart failure: Secondary | ICD-10-CM | POA: Diagnosis present

## 2022-06-29 DIAGNOSIS — I2510 Atherosclerotic heart disease of native coronary artery without angina pectoris: Secondary | ICD-10-CM | POA: Diagnosis present

## 2022-06-29 DIAGNOSIS — O135 Gestational [pregnancy-induced] hypertension without significant proteinuria, complicating the puerperium: Secondary | ICD-10-CM

## 2022-06-29 DIAGNOSIS — I159 Secondary hypertension, unspecified: Secondary | ICD-10-CM | POA: Diagnosis present

## 2022-06-29 DIAGNOSIS — E876 Hypokalemia: Secondary | ICD-10-CM | POA: Diagnosis present

## 2022-06-29 DIAGNOSIS — R0602 Shortness of breath: Secondary | ICD-10-CM

## 2022-06-29 DIAGNOSIS — Z9851 Tubal ligation status: Secondary | ICD-10-CM

## 2022-06-29 DIAGNOSIS — R000 Tachycardia, unspecified: Secondary | ICD-10-CM | POA: Diagnosis present

## 2022-06-29 DIAGNOSIS — Z1152 Encounter for screening for COVID-19: Secondary | ICD-10-CM

## 2022-06-29 LAB — RESP PANEL BY RT-PCR (RSV, FLU A&B, COVID)  RVPGX2
Influenza A by PCR: NEGATIVE
Influenza B by PCR: NEGATIVE
Resp Syncytial Virus by PCR: NEGATIVE
SARS Coronavirus 2 by RT PCR: NEGATIVE

## 2022-06-29 LAB — URINALYSIS, ROUTINE W REFLEX MICROSCOPIC
Bacteria, UA: NONE SEEN
Bilirubin Urine: NEGATIVE
Glucose, UA: NEGATIVE mg/dL
Hgb urine dipstick: NEGATIVE
Ketones, ur: NEGATIVE mg/dL
Leukocytes,Ua: NEGATIVE
Nitrite: NEGATIVE
Protein, ur: 30 mg/dL — AB
Specific Gravity, Urine: >1.046 — ABNORMAL HIGH (ref 1.005–1.030)
pH: 6 (ref 5.0–8.0)

## 2022-06-29 LAB — RAPID URINE DRUG SCREEN, HOSP PERFORMED
Amphetamines: NOT DETECTED
Barbiturates: NOT DETECTED
Benzodiazepines: NOT DETECTED
Cocaine: NOT DETECTED
Opiates: POSITIVE — AB
Tetrahydrocannabinol: NOT DETECTED

## 2022-06-29 LAB — CBC WITH DIFFERENTIAL/PLATELET
Abs Immature Granulocytes: 0.03 10*3/uL (ref 0.00–0.07)
Basophils Absolute: 0 10*3/uL (ref 0.0–0.1)
Basophils Relative: 1 %
Eosinophils Absolute: 0.1 10*3/uL (ref 0.0–0.5)
Eosinophils Relative: 1 %
HCT: 27.5 % — ABNORMAL LOW (ref 36.0–46.0)
Hemoglobin: 8.7 g/dL — ABNORMAL LOW (ref 12.0–15.0)
Immature Granulocytes: 0 %
Lymphocytes Relative: 16 %
Lymphs Abs: 1.4 10*3/uL (ref 0.7–4.0)
MCH: 24.9 pg — ABNORMAL LOW (ref 26.0–34.0)
MCHC: 31.6 g/dL (ref 30.0–36.0)
MCV: 78.8 fL — ABNORMAL LOW (ref 80.0–100.0)
Monocytes Absolute: 0.5 10*3/uL (ref 0.1–1.0)
Monocytes Relative: 6 %
Neutro Abs: 6.8 10*3/uL (ref 1.7–7.7)
Neutrophils Relative %: 76 %
Platelets: 444 10*3/uL — ABNORMAL HIGH (ref 150–400)
RBC: 3.49 MIL/uL — ABNORMAL LOW (ref 3.87–5.11)
RDW: 17.1 % — ABNORMAL HIGH (ref 11.5–15.5)
WBC: 8.8 10*3/uL (ref 4.0–10.5)
nRBC: 0 % (ref 0.0–0.2)

## 2022-06-29 LAB — COMPREHENSIVE METABOLIC PANEL
ALT: 20 U/L (ref 0–44)
AST: 27 U/L (ref 15–41)
Albumin: 3.5 g/dL (ref 3.5–5.0)
Alkaline Phosphatase: 62 U/L (ref 38–126)
Anion gap: 12 (ref 5–15)
BUN: 12 mg/dL (ref 6–20)
CO2: 19 mmol/L — ABNORMAL LOW (ref 22–32)
Calcium: 8.6 mg/dL — ABNORMAL LOW (ref 8.9–10.3)
Chloride: 105 mmol/L (ref 98–111)
Creatinine, Ser: 1.24 mg/dL — ABNORMAL HIGH (ref 0.44–1.00)
GFR, Estimated: 59 mL/min — ABNORMAL LOW (ref >60)
Glucose, Bld: 116 mg/dL — ABNORMAL HIGH (ref 70–99)
Potassium: 2.8 mmol/L — ABNORMAL LOW (ref 3.5–5.1)
Sodium: 136 mmol/L (ref 135–145)
Total Bilirubin: 2.5 mg/dL — ABNORMAL HIGH (ref 0.3–1.2)
Total Protein: 6.7 g/dL (ref 6.5–8.1)

## 2022-06-29 LAB — I-STAT BETA HCG BLOOD, ED (MC, WL, AP ONLY): I-stat hCG, quantitative: <5 m[IU]/mL (ref <5)

## 2022-06-29 LAB — ECHOCARDIOGRAM COMPLETE
Area-P 1/2: 6.09 cm2
Calc EF: 20.2 %
Height: 67 in
MV M vel: 6.14 m/s
MV Peak grad: 150.8 mmHg
MV VTI: 5.93 cm2
Radius: 0.5 cm
S' Lateral: 5.1 cm
Single Plane A2C EF: 23.1 %
Single Plane A4C EF: 15 %
Weight: 3136 oz

## 2022-06-29 LAB — TROPONIN I (HIGH SENSITIVITY)
Troponin I (High Sensitivity): 110 ng/L (ref <18)
Troponin I (High Sensitivity): 232 ng/L (ref <18)
Troponin I (High Sensitivity): 207 ng/L (ref <18)
Troponin I (High Sensitivity): 236 ng/L (ref <18)

## 2022-06-29 LAB — HIV ANTIBODY (ROUTINE TESTING W REFLEX): HIV Screen 4th Generation wRfx: NONREACTIVE

## 2022-06-29 LAB — LACTIC ACID, PLASMA
Lactic Acid, Venous: 1.3 mmol/L (ref 0.5–1.9)
Lactic Acid, Venous: 1.3 mmol/L (ref 0.5–1.9)

## 2022-06-29 LAB — BRAIN NATRIURETIC PEPTIDE: B Natriuretic Peptide: 618.6 pg/mL — ABNORMAL HIGH (ref 0.0–100.0)

## 2022-06-29 LAB — LIPID PANEL
Cholesterol: 117 mg/dL (ref 0–200)
HDL: 32 mg/dL — ABNORMAL LOW (ref >40)
LDL Cholesterol: 71 mg/dL (ref 0–99)
Total CHOL/HDL Ratio: 3.7 RATIO
Triglycerides: 72 mg/dL (ref <150)
VLDL: 14 mg/dL (ref 0–40)

## 2022-06-29 LAB — LIPASE, BLOOD: Lipase: 25 U/L (ref 11–51)

## 2022-06-29 LAB — TSH: TSH: 0.741 u[IU]/mL (ref 0.350–4.500)

## 2022-06-29 MED ORDER — SODIUM CHLORIDE 0.9% FLUSH: 10.0000 mL | INTRAVENOUS | Status: DC | PRN

## 2022-06-29 MED ORDER — SODIUM CHLORIDE 0.9% FLUSH
10.0000 mL | Freq: Two times a day (BID) | INTRAVENOUS | Status: DC
  Administered 2022-06-30: 10 mL
  Administered 2022-06-30: 30 mL
  Administered 2022-06-30 – 2022-07-02 (×5): 10 mL
  Administered 2022-07-03: 30 mL
  Administered 2022-07-03 – 2022-07-04 (×2): 10 mL

## 2022-06-29 MED ORDER — POTASSIUM CHLORIDE 10 MEQ/100ML IV SOLN: 10.0000 meq | INTRAVENOUS | Status: DC

## 2022-06-29 MED ORDER — FUROSEMIDE 10 MG/ML IJ SOLN
40.0000 mg | Freq: Two times a day (BID) | INTRAMUSCULAR | Status: AC
  Administered 2022-06-29 – 2022-07-03 (×8): 40 mg via INTRAVENOUS
  Filled 2022-06-29 (×8): qty 4

## 2022-06-29 MED ORDER — ONDANSETRON HCL 4 MG/2ML IJ SOLN
4.0000 mg | Freq: Once | INTRAMUSCULAR | Status: AC
  Administered 2022-06-29: 4 mg via INTRAVENOUS
  Filled 2022-06-29: qty 2

## 2022-06-29 MED ORDER — NITROGLYCERIN IN D5W 200-5 MCG/ML-% IV SOLN
0.0000 ug/min | INTRAVENOUS | Status: DC
  Administered 2022-06-29 – 2022-06-30 (×2): 5 ug/min via INTRAVENOUS
  Filled 2022-06-29: qty 250

## 2022-06-29 MED ORDER — MORPHINE SULFATE (PF) 4 MG/ML IV SOLN
4.0000 mg | Freq: Once | INTRAVENOUS | Status: AC
  Administered 2022-06-29: 4 mg via INTRAVENOUS
  Filled 2022-06-29: qty 1

## 2022-06-29 MED ORDER — POTASSIUM CHLORIDE CRYS ER 20 MEQ PO TBCR
40.0000 meq | EXTENDED_RELEASE_TABLET | Freq: Two times a day (BID) | ORAL | Status: DC
  Administered 2022-06-29 – 2022-06-30 (×3): 40 meq via ORAL
  Filled 2022-06-29 (×3): qty 2

## 2022-06-29 MED ORDER — IOHEXOL 350 MG/ML SOLN
75.0000 mL | Freq: Once | INTRAVENOUS | Status: AC | PRN
  Administered 2022-06-29: 75 mL via INTRAVENOUS

## 2022-06-29 MED ORDER — PERFLUTREN LIPID MICROSPHERE
1.0000 mL | INTRAVENOUS | Status: AC | PRN
  Administered 2022-06-29: 4 mL via INTRAVENOUS

## 2022-06-29 MED ORDER — HYDRALAZINE HCL 25 MG PO TABS
25.0000 mg | ORAL_TABLET | Freq: Three times a day (TID) | ORAL | Status: DC
  Administered 2022-06-29 – 2022-07-01 (×6): 25 mg via ORAL
  Filled 2022-06-29 (×7): qty 1

## 2022-06-29 MED ORDER — SACUBITRIL-VALSARTAN 49-51 MG PO TABS
1.0000 | ORAL_TABLET | Freq: Two times a day (BID) | ORAL | Status: DC
  Administered 2022-06-29 – 2022-06-30 (×2): 1 via ORAL
  Filled 2022-06-29 (×2): qty 1

## 2022-06-29 MED ORDER — SPIRONOLACTONE 12.5 MG HALF TABLET
12.5000 mg | ORAL_TABLET | Freq: Every day | ORAL | Status: DC
  Administered 2022-06-29 – 2022-06-30 (×2): 12.5 mg via ORAL
  Filled 2022-06-29 (×2): qty 1

## 2022-06-29 MED ORDER — ENOXAPARIN SODIUM 40 MG/0.4ML IJ SOSY
40.0000 mg | PREFILLED_SYRINGE | INTRAMUSCULAR | Status: DC
  Administered 2022-06-29 – 2022-07-03 (×5): 40 mg via SUBCUTANEOUS
  Filled 2022-06-29 (×5): qty 0.4

## 2022-06-29 MED ORDER — SODIUM CHLORIDE 0.9 % IV BOLUS
500.0000 mL | Freq: Once | INTRAVENOUS | Status: AC
  Administered 2022-06-29: 500 mL via INTRAVENOUS

## 2022-06-29 MED ORDER — ACETAMINOPHEN 325 MG PO TABS
650.0000 mg | ORAL_TABLET | Freq: Four times a day (QID) | ORAL | Status: DC | PRN
  Administered 2022-06-29 – 2022-06-30 (×2): 650 mg via ORAL
  Filled 2022-06-29 (×2): qty 2

## 2022-06-29 MED ORDER — CHLORHEXIDINE GLUCONATE CLOTH 2 % EX PADS
6.0000 | MEDICATED_PAD | Freq: Every day | CUTANEOUS | Status: DC
  Administered 2022-06-30 – 2022-07-04 (×5): 6 via TOPICAL

## 2022-06-29 MED ORDER — SODIUM CHLORIDE 0.9 % IV BOLUS
1000.0000 mL | Freq: Once | INTRAVENOUS | Status: AC
  Administered 2022-06-29: 1000 mL via INTRAVENOUS

## 2022-06-29 MED ORDER — POTASSIUM CHLORIDE 10 MEQ/100ML IV SOLN
10.0000 meq | Freq: Once | INTRAVENOUS | Status: AC
  Administered 2022-06-29: 10 meq via INTRAVENOUS
  Filled 2022-06-29: qty 100

## 2022-06-29 NOTE — Assessment & Plan Note (Addendum)
K stable at 3.6 this AM. Could be due to hypoaldosteronism and lasix use.  - Replete 40 meq K and PM 40 meq K - Follow up Renin/Aldosterone

## 2022-06-29 NOTE — Progress Notes (Signed)
Peripherally Inserted Central Catheter Placement  The IV Nurse has discussed with the patient and/or persons authorized to consent for the patient, the purpose of this procedure and the potential benefits and risks involved with this procedure.  The benefits include less needle sticks, lab draws from the catheter, and the patient may be discharged home with the catheter. Risks include, but not limited to, infection, bleeding, blood clot (thrombus formation), and puncture of an artery; nerve damage and irregular heartbeat and possibility to perform a PICC exchange if needed/ordered by physician.  Alternatives to this procedure were also discussed.  Bard Power PICC patient education guide, fact sheet on infection prevention and patient information card has been provided to patient /or left at bedside.    PICC Placement Documentation  PICC Triple Lumen 06/29/22 Right Brachial 38 cm 1 cm (Active)  Indication for Insertion or Continuance of Line Vasoactive infusions;Chronic illness with exacerbations (CF, Sickle Cell, etc.) 06/29/22 2119  Exposed Catheter (cm) 1 cm 06/29/22 2119  Site Assessment Clean, Dry, Intact 06/29/22 2119  Lumen #1 Status Flushed;Saline locked;Blood return noted 06/29/22 2119  Lumen #2 Status Flushed;Saline locked;Blood return noted 06/29/22 2119  Lumen #3 Status Flushed;Saline locked;Blood return noted 06/29/22 2119  Dressing Type Transparent 06/29/22 2119  Dressing Status Antimicrobial disc in place;Clean, Dry, Intact 06/29/22 2119  Safety Lock Not Applicable 17/51/02 5852  Line Care Connections checked and tightened 06/29/22 2119  Line Adjustment (NICU/IV Team Only) No 06/29/22 2119  Dressing Intervention New dressing 06/29/22 2119  Dressing Change Due 07/06/22 06/29/22 2119       Winfrey Chillemi, Nicolette Bang 06/29/2022, 9:20 PM

## 2022-06-29 NOTE — Assessment & Plan Note (Addendum)
Patient continues to be tachycardic to low 100s today. Still afebrile without evidence of infection on exam. CT angio negative for PE. No arrhythmias on monitor. Most likely due to being intravascularly depleted and trying to compensate for low EF.  - Continue to monitor

## 2022-06-29 NOTE — Assessment & Plan Note (Addendum)
Soft Bps overnight. Renal artery ultrasound negative for stenosis. HTN etiology still unclear could be due to hyperaldosteronism or possibly related to OSA.  - f/u 5 HIAA urine, aldosterone plus renin activity ratio, urine catecholamines, urine cortisol, urine metanephrines.

## 2022-06-29 NOTE — Assessment & Plan Note (Addendum)
Creatinine stable at 1.26, baseline 0.9 2 years ago.  Likely prerenal in setting of acute congestive heart failure.  - Caution with nephrotoxic agents - stopped 40 mg IV lasix BID - f/u BMP

## 2022-06-29 NOTE — ED Notes (Signed)
ED TO INPATIENT HANDOFF REPORT  ED Nurse Name and Phone #:   S Name/Age/Gender Ann Robbins 32 y.o. female Room/Bed: 018C/018C  Code Status   Code Status: Full Code  Home/SNF/Other Home Patient oriented to: self, place, time Is this baseline? Yes   Triage Complete: Triage complete  Chief Complaint Elevated troponin [R79.89]  Triage Note Pt here with diffuse abdominal pain onset 1 week ago that has been worsening. Pt also complains of nausea and shob for the last week. Denies emesis, diarrhea or dysuria.    Allergies No Known Allergies  Level of Care/Admitting Diagnosis ED Disposition     ED Disposition  Admit   Condition  --   Comment  Hospital Area: [facility] [[id number]]  Level of Care: Telemetry Medical [[id number]]  May place patient in observation at [facility] or [facility] if equivalent level of care is available:: No  Covid Evaluation: Confirmed COVID Negative  Diagnosis: Elevated troponin [321909]  Admitting Physician: Tiffany Kocher [[id number]]  Attending Physician: Moses Manners [[id number]]          B Medical/Surgery History Past Medical History:  Diagnosis Date   Hypertension 2015   First with PIH and bp remained high after delivery   Past Surgical History:  Procedure Laterality Date   CHOLECYSTECTOMY  02/2015   Laparoscopic   TUBAL LIGATION Bilateral 05/21/2017   Procedure: POST PARTUM TUBAL LIGATION;  Surgeon: Adam Phenix, MD;  Location: [facility];  Service: Gynecology;  Laterality: Bilateral;     A IV Location/Drains/Wounds Patient Lines/Drains/Airways Status     Active Line/Drains/Airways     Name Placement date Placement time Site Days   Peripheral IV 06/29/22 20 G 1" Left Antecubital 06/29/22  0800  Antecubital  less than 1   Incision - 1 Port Abdomen 1: Umbilicus 05/21/17  --  -- 1865            Intake/Output Last 24 hours  Intake/Output Summary (Last 24 hours) at 06/29/2022 1433 Last data filed at 06/29/2022  1155 Gross per 24 hour  Intake 1600 ml  Output --  Net 1600 ml    Labs/Imaging Results for orders placed or performed during the hospital encounter of 06/29/22 (from the past 48 hour(s))  CBC with Differential     Status: Abnormal   Collection Time: 06/29/22  7:59 AM  Result Value Ref Range   WBC 8.8 4.0 - 10.5 K/uL   RBC 3.49 (L) 3.87 - 5.11 MIL/uL   Hemoglobin 8.7 (L) 12.0 - 15.0 g/dL   HCT 72.5 (L) 36.6 - 44.0 %   MCV 78.8 (L) 80.0 - 100.0 fL   MCH 24.9 (L) 26.0 - 34.0 pg   MCHC 31.6 30.0 - 36.0 g/dL   RDW 34.7 (H) 42.5 - 95.6 %   Platelets 444 (H) 150 - 400 K/uL   nRBC 0.0 0.0 - 0.2 %   Neutrophils Relative % 76 %   Neutro Abs 6.8 1.7 - 7.7 K/uL   Lymphocytes Relative 16 %   Lymphs Abs 1.4 0.7 - 4.0 K/uL   Monocytes Relative 6 %   Monocytes Absolute 0.5 0.1 - 1.0 K/uL   Eosinophils Relative 1 %   Eosinophils Absolute 0.1 0.0 - 0.5 K/uL   Basophils Relative 1 %   Basophils Absolute 0.0 0.0 - 0.1 K/uL   Immature Granulocytes 0 %   Abs Immature Granulocytes 0.03 0.00 - 0.07 K/uL    Comment: Performed at [facility], [address], [place], [place] [address]  Comprehensive metabolic panel     Status: Abnormal   Collection Time: 06/29/22  7:59 AM  Result Value Ref Range   Sodium 136 135 - 145 mmol/L   Potassium 2.8 (L) 3.5 - 5.1 mmol/L   Chloride 105 98 - 111 mmol/L   CO2 19 (L) 22 - 32 mmol/L   Glucose, Bld 116 (H) 70 - 99 mg/dL    Comment: Glucose reference range applies only to samples taken after fasting for at least 8 hours.   BUN 12 6 - 20 mg/dL   Creatinine, Ser 1.24 (H) 0.44 - 1.00 mg/dL   Calcium 8.6 (L) 8.9 - 10.3 mg/dL   Total Protein 6.7 6.5 - 8.1 g/dL   Albumin 3.5 3.5 - 5.0 g/dL   AST 27 15 - 41 U/L   ALT 20 0 - 44 U/L   Alkaline Phosphatase 62 38 - 126 U/L   Total Bilirubin 2.5 (H) 0.3 - 1.2 mg/dL   GFR, Estimated 59 (L) >60 mL/min    Comment: (NOTE) Calculated using the CKD-EPI Creatinine Equation (2021)    Anion gap 12 5 - 15     Comment: Performed at [facility] [address], [place], [place] [address]  Lipase, blood     Status: None   Collection Time: 06/29/22  7:59 AM  Result Value Ref Range   Lipase 25 11 - 51 U/L    Comment: Performed at [facility] [address], [place], [place] [address]  Troponin I (High Sensitivity)     Status: Abnormal   Collection Time: 06/29/22  7:59 AM  Result Value Ref Range   Troponin I (High Sensitivity) 110 (HH) <18 ng/L    Comment: CRITICAL RESULT CALLED TO, READ BACK BY AND VERIFIED WITH S.BERTRAND,RN 1209 06/29/22 CLARK,S (NOTE) Elevated high sensitivity troponin I (hsTnI) values and significant  changes across serial measurements may suggest ACS but many other  chronic and acute conditions are known to elevate hsTnI results.  Refer to the "Links" section for chest pain algorithms and additional  guidance. Performed at [facility], [place] [address] [place], [place] [address]   Brain natriuretic peptide     Status: Abnormal   Collection Time: 06/29/22  7:59 AM  Result Value Ref Range   B Natriuretic Peptide 618.6 (H) 0.0 - 100.0 pg/mL    Comment: Performed at [facility] [address], [place], [place] [address]  Resp panel by RT-PCR (RSV, Flu A&B, Covid) Anterior Nasal Swab     Status: None   Collection Time: 06/29/22  8:00 AM   Specimen: Anterior Nasal Swab  Result Value Ref Range   SARS Coronavirus 2 by RT PCR NEGATIVE NEGATIVE   Influenza A by PCR NEGATIVE NEGATIVE   Influenza B by PCR NEGATIVE NEGATIVE    Comment: (NOTE) The Xpert Xpress SARS-CoV-2/FLU/RSV plus assay is intended as an aid in the diagnosis of influenza from Nasopharyngeal swab specimens and should not be used as a sole basis for treatment. Nasal washings and aspirates are unacceptable for Xpert Xpress SARS-CoV-2/FLU/RSV testing.  Fact Sheet for Patients: EntrepreneurPulse.com.au  Fact Sheet for Healthcare  Providers: IncredibleEmployment.be  This test is not yet approved or cleared by the [place] FDA and has been authorized for detection and/or diagnosis of SARS-CoV-2 by FDA under an Emergency Use Authorization (EUA). This EUA will remain in effect (meaning this test can be used) for the duration of the  COVID-19 declaration under Section 564(b)(1) of the Act, 21 U.S.C. section 360bbb-3(b)(1), unless the authorization is terminated or revoked.     Resp Syncytial Virus by PCR NEGATIVE NEGATIVE    Comment: (NOTE) Fact Sheet for Patients: EntrepreneurPulse.com.au  Fact Sheet for Healthcare Providers: IncredibleEmployment.be  This test is not yet approved or cleared by the [place] FDA and has been authorized for detection and/or diagnosis of SARS-CoV-2 by FDA under an Emergency Use Authorization (EUA). This EUA will remain in effect (meaning this test can be used) for the duration of the COVID-19 declaration under Section 564(b)(1) of the Act, 21 U.S.C. section 360bbb-3(b)(1), unless the authorization is terminated or revoked.  Performed at [facility], [place] [address], [place], [place] [address]   I-Stat beta hCG blood, ED     Status: None   Collection Time: 06/29/22  8:17 AM  Result Value Ref Range   I-stat hCG, quantitative <5.0 <5 mIU/mL   Comment 3            Comment:   GEST. AGE      CONC.  (mIU/mL)   <=1 WEEK        5 - 50     2 WEEKS       50 - 500     3 WEEKS       100 - 10,000     4 WEEKS     1,000 - 30,000        FEMALE AND NON-PREGNANT FEMALE:     LESS THAN 5 mIU/mL   Urinalysis, Routine w reflex microscopic -Urine, Clean Catch     Status: Abnormal   Collection Time: 06/29/22 11:05 AM  Result Value Ref Range   Color, Urine YELLOW YELLOW   APPearance CLEAR CLEAR   Specific Gravity, Urine >1.046 (H) 1.005 - 1.030   pH 6.0 5.0 - 8.0   Glucose, UA NEGATIVE NEGATIVE mg/dL   Hgb urine dipstick  NEGATIVE NEGATIVE   Bilirubin Urine NEGATIVE NEGATIVE   Ketones, ur NEGATIVE NEGATIVE mg/dL   Protein, ur 30 (A) NEGATIVE mg/dL   Nitrite NEGATIVE NEGATIVE   Leukocytes,Ua NEGATIVE NEGATIVE   RBC / HPF 0-5 0 - 5 RBC/hpf   WBC, UA 0-5 0 - 5 WBC/hpf   Bacteria, UA NONE SEEN NONE SEEN   Squamous Epithelial / HPF 0-5 0 - 5 /HPF   Mucus PRESENT    Hyaline Casts, UA PRESENT     Comment: Performed at [facility], [address] [place], [place] [address]  Troponin I (High Sensitivity)     Status: Abnormal   Collection Time: 06/29/22 12:49 PM  Result Value Ref Range   Troponin I (High Sensitivity) 232 (HH) <18 ng/L    Comment: CRITICAL VALUE NOTED.  VALUE IS CONSISTENT WITH PREVIOUSLY REPORTED AND CALLED VALUE. (NOTE) Elevated high sensitivity troponin I (hsTnI) values and significant  changes across serial measurements may suggest ACS but many other  chronic and acute conditions are known to elevate hsTnI results.  Refer to the "Links" section for chest pain algorithms and additional  guidance. Performed at [facility], [place] [address] [place], [place] [address]    DG Chest Port 1 View  Result Date: 06/29/2022 CLINICAL DATA:  Shortness of breath. EXAM: PORTABLE CHEST 1 VIEW COMPARISON:  Lung bases from abdominal CT earlier today. FINDINGS: Enlarged cardiopericardial silhouette, pericardial effusion on CT earlier today. Small bilateral pleural effusions. Vascular congestion with question of subtle septal thickening, suspicious  for pulmonary edema. No pneumothorax. No acute osseous findings. IMPRESSION: 1. Enlarged cardiopericardial silhouette, pericardial effusion on CT earlier today. 2. Small bilateral pleural effusions. Vascular congestion with question of subtle septal thickening, suspicious for pulmonary edema. Electronically Signed   By: Keith Rake M.D.   On: 06/29/2022 12:48   US Abdomen Limited RUQ (LIVER/GB)  Result Date: 06/29/2022 CLINICAL DATA:  Right upper  quadrant abdominal pain. EXAM: ULTRASOUND ABDOMEN LIMITED RIGHT UPPER QUADRANT COMPARISON:  CT scan of same day. FINDINGS: Gallbladder: Status post cholecystectomy. Common bile duct: Diameter: 4 mm which is within normal limits. Liver: No focal lesion identified. Within normal limits in parenchymal echogenicity. Portal vein is patent on color Doppler imaging with normal direction of blood flow towards the liver. Other: None. IMPRESSION: Status post cholecystectomy. No acute abnormality seen in the right upper quadrant of the abdomen. Electronically Signed   By: Marijo Conception M.D.   On: 06/29/2022 11:50   CT ABDOMEN PELVIS W CONTRAST  Result Date: 06/29/2022 CLINICAL DATA:  Acute abdominal pain. EXAM: CT ABDOMEN AND PELVIS WITH CONTRAST TECHNIQUE: Multidetector CT imaging of the abdomen and pelvis was performed using the standard protocol following bolus administration of intravenous contrast. RADIATION DOSE REDUCTION: This exam was performed according to the departmental dose-optimization program which includes automated exposure control, adjustment of the mA and/or kV according to patient size and/or use of iterative reconstruction technique. CONTRAST:  44mL OMNIPAQUE IOHEXOL 350 MG/ML SOLN COMPARISON:  None Available. FINDINGS: Lower chest: Bibasilar ground-glass nodules in pleural effusions. Small pericardial effusion. Hepatobiliary: No focal hepatic lesion. Postcholecystectomy. No biliary duct dilatation. Common bile duct is normal. Pancreas: Pancreas is normal. No ductal dilatation. No pancreatic inflammation. Spleen: Normal spleen Adrenals/urinary tract: Adrenal glands and kidneys are normal. The ureters and bladder normal. Stomach/Bowel: Stomach, small bowel, appendix, and cecum are normal. The colon and rectosigmoid colon are normal. Vascular/Lymphatic: Abdominal aorta is normal caliber. No periportal or retroperitoneal adenopathy. No pelvic adenopathy. Reproductive: Multiple round enhancing lesions  within the uterine body consistent with leiomyoma. Ovaries normal. Other: No free fluid. Musculoskeletal: No aggressive osseous lesion. IMPRESSION: 1. Bibasilar ground-glass nodules, pleural effusion and pericardial effusion. Findings concerning for pulmonary edema of unclear etiology. Recommend CT of the thorax. 2. No acute findings abdomen pelvis. Electronically Signed   By: Suzy Bouchard M.D.   On: 06/29/2022 10:37    Pending Labs Unresulted Labs (From admission, onward)     Start     Ordered   06/30/22 0500  Comprehensive metabolic panel  Tomorrow morning,   R        06/29/22 1400   06/30/22 0500  CBC  Tomorrow morning,   R        06/29/22 1400   06/29/22 1413  Lipid panel  Once,   R        06/29/22 1413   06/29/22 1411  TSH  Once,   R        06/29/22 1410   06/29/22 1408  Lactic acid, plasma  STAT Now then every 3 hours,   R (with STAT occurrences)      06/29/22 1408   06/29/22 1357  HIV Antibody (routine testing w rflx)  (HIV Antibody (Routine testing w reflex) panel)  Once,   R        06/29/22 1400   06/29/22 1323  5 HIAA, quantitative, urine, 24 hour  Once,   R        06/29/22 1323   06/29/22 1323  Aldosterone +  renin activity w/ ratio  Once,   R        06/29/22 1323   06/29/22 1322  Metanephrines, urine, 24 hour  Once,   R        06/29/22 1323   06/29/22 1322  Cortisol, urine, free  Once,   R        06/29/22 1323   06/29/22 1321  Catecholamines,Ur.,Free,24 Hh  Once,   R        06/29/22 1323            Vitals/Pain Today's Vitals   06/29/22 1100 06/29/22 1130 06/29/22 1158 06/29/22 1225  BP:    (!) 185/134  Pulse: (!) 112 (!) 111  (!) 118  Resp: (!) 28 15  (!) 48  Temp:   98.3 F (36.8 C)   TempSrc:   Oral   SpO2: 100% 95%  97%  Weight:      Height:      PainSc:        Isolation Precautions No active isolations  Medications Medications  enoxaparin (LOVENOX) injection 40 mg (has no administration in time range)  nitroGLYCERIN 50 mg in dextrose 5 % 250  mL (0.2 mg/mL) infusion (has no administration in time range)  hydrALAZINE (APRESOLINE) tablet 25 mg (has no administration in time range)  perflutren lipid microspheres (DEFINITY) IV suspension (4 mLs Intravenous Given 06/29/22 1424)  potassium chloride SA (KLOR-CON M) CR tablet 40 mEq (has no administration in time range)  sodium chloride 0.9 % bolus 500 mL (0 mLs Intravenous Stopped 06/29/22 0907)  morphine (PF) 4 MG/ML injection 4 mg (4 mg Intravenous Given 06/29/22 0814)  ondansetron (ZOFRAN) injection 4 mg (4 mg Intravenous Given 06/29/22 0814)  potassium chloride 10 mEq in 100 mL IVPB (0 mEq Intravenous Stopped 06/29/22 1154)  sodium chloride 0.9 % bolus 1,000 mL (0 mLs Intravenous Stopped 06/29/22 1155)  iohexol (OMNIPAQUE) 350 MG/ML injection 75 mL (75 mLs Intravenous Contrast Given 06/29/22 1017)  ondansetron (ZOFRAN) injection 4 mg (4 mg Intravenous Given 06/29/22 1248)    Mobility walks     Focused Assessments Cardiac Assessment Handoff:  Cardiac Rhythm: Sinus tachycardia No results found for: "CKTOTAL", "CKMB", "CKMBINDEX", "TROPONINI" No results found for: "DDIMER" Does the Patient currently have chest pain? No    R Recommendations: See Admitting Provider Note  Report given to:   Additional Notes:

## 2022-06-29 NOTE — Consult Note (Addendum)
Advanced Heart Failure Team Consult Note   Primary Physician: Hoy Register, MD PCP-Cardiologist:  None  Reason for Consultation: Acute Systolic Heart Failure   HPI:    Ann Robbins is seen today for evaluation of acute systolic heart failure at the request of Dr Mayford Knife.  Ann Robbins is a 32 year with a history of cholecystectomy 2016, gestational hypertension with her first child  and has remained hypertensive since that time.She has 3 children ages 39,9, and 5 and had tubal ligation 2018. Hypertension has been untreated for the last year. No alcohol or substance abuse.  Her mom was recently diagnosed with her heart failure.   Over the last few weeks she developed abdominal bloating and shortness of breath. Increase shortness of breath with exertion. + Orthopnea. She has been off medications for the last year and has not received outpatient medical care in some time. Boyfriend says she snores. Uninsured. Works in a day care.   Presented to the ED with abdominal bloating and lower extremity edema. Hypertensive on arrival. SBP > 180. CXR with vascular congestion. Given IV fluids in the ED. Pertinent labs: K 2.8, CO2 19, Hgb 8.7, HS Trop 110>>232, BNP 618, creatinine 1.24, HCG negative. Started on IV lasix, NTG drip, and hydralazine.   Echo completed and showed EF 20-25% RV moderately reduced, LA moderately dilated and RA mildly dilated.     Review of Systems: [y] = yes, [ ]  = no   General: Weight gain [ ] ; Weight loss [ ] ; Anorexia [ ] ; Fatigue [ ] ; Fever [ ] ; Chills [ ] ; Weakness [ ]   Cardiac: Chest pain/pressure [ ] ; Resting SOB [ ] ; Exertional SOB [ Y]; Orthopnea [ Y]; Pedal Edema [ Y]; Palpitations [ ] ; Syncope [ ] ; Presyncope [ ] ; Paroxysmal nocturnal dyspnea[ ]   Pulmonary: Cough [ ] ; Wheezing[ ] ; Hemoptysis[ ] ; Sputum [ ] ; Snoring [ ]   GI: Vomiting[ ] ; Dysphagia[ ] ; Melena[ ] ; Hematochezia [ ] ; Heartburn[ ] ; Abdominal pain [ ] ; Constipation [ ] ; Diarrhea [ ] ; BRBPR [ ]   GU:  Hematuria[ ] ; Dysuria [ ] ; Nocturia[ ]   Vascular: Pain in legs with walking [ ] ; Pain in feet with lying flat [ ] ; Non-healing sores [ ] ; Stroke [ ] ; TIA [ ] ; Slurred speech [ ] ;  Neuro: Headaches[ ] ; Vertigo[ ] ; Seizures[ ] ; Paresthesias[ ] ;Blurred vision [ ] ; Diplopia [ ] ; Vision changes [ ]   Ortho/Skin: Arthritis [ ] ; Joint pain [ ] ; Muscle pain [ ] ; Joint swelling [ ] ; Back Pain [ ] ; Rash [ ]   Psych: Depression[ ] ; Anxiety[ ]   Heme: Bleeding problems [ ] ; Clotting disorders [ ] ; Anemia [ ]   Endocrine: Diabetes [ ] ; Thyroid dysfunction[ ]   Home Medications Prior to Admission medications   Medication Sig Start Date End Date Taking? Authorizing Provider  amLODipine (NORVASC) 10 MG tablet Take 1 tablet (10 mg total) by mouth daily. 11/01/20   , MD  benzonatate (TESSALON) 100 MG capsule Take 1 capsule (100 mg total) by mouth every 8 (eight) hours. 05/24/22   Rafoth, , MD  hydrochlorothiazide (HYDRODIURIL) 25 MG tablet Take 1 tablet (25 mg total) by mouth daily. 12/14/20   , MD  prenatal vitamin w/FE, FA (PRENATAL 1 + 1) 27-1 MG TABS tablet Take 1 tablet by mouth daily Patient not taking: Reported on 11/01/2020 02/19/17   Anyanwu, , MD  promethazine-dextromethorphan (PROMETHAZINE-DM) 6.25-15 MG/5ML syrup Take 5 mLs by mouth 3 (three) times daily as needed for cough. 03/25/21  Raspet, Noberto Retort, PA-C    Past Medical History: Past Medical History:  Diagnosis Date   Hypertension 2015   First with PIH and bp remained high after delivery    Past Surgical History: Past Surgical History:  Procedure Laterality Date   CHOLECYSTECTOMY  02/2015   Laparoscopic   TUBAL LIGATION Bilateral 05/21/2017   Procedure: POST PARTUM TUBAL LIGATION;  Surgeon: Adam Phenix, MD;  Location: [facility];  Service: Gynecology;  Laterality: Bilateral;    Family History: Family History  Problem Relation Age of Onset   Hypertension Mother     Social History: Social History    Socioeconomic History   Marital status: Single    Spouse name: Nicanor Alcon   Number of children: 2   Years of education: college-some   Highest education level: Not on file  Occupational History   Occupation: [employer]  Tobacco Use   Smoking status: Never   Smokeless tobacco: Never  Substance and Sexual Activity   Alcohol use: No   Drug use: No   Sexual activity: Yes    Birth control/protection: None  Other Topics Concern   Not on file  Social History Narrative   Originally from [place] in March of 2017   Lives at home with boyfriend, Jena Gauss and their 2 children   Social Determinants of Health   Financial Resource Strain: Not on file  Food Insecurity: Not on file  Transportation Needs: Not on file  Physical Activity: Not on file  Stress: Not on file  Social Connections: Not on file    Allergies:  No Known Allergies  Objective:    Vital Signs:   Temp:  [98.3 F (36.8 C)-98.6 F (37 C)] 98.3 F (36.8 C) (02/08 1158) Pulse Rate:  [108-130] 117 (02/08 1515) Resp:  [15-48] 28 (02/08 1515) BP: (169-200)/(120-147) 169/120 (02/08 1515) SpO2:  [87 %-100 %] 98 % (02/08 1515) Weight:  [88.9 kg] 88.9 kg (02/08 0822)    Weight change: Filed Weights   06/29/22 0822  Weight: 88.9 kg    Intake/Output:   Intake/Output Summary (Last 24 hours) at 06/29/2022 1518 Last data filed at 06/29/2022 1155 Gross per 24 hour  Intake 1600 ml  Output --  Net 1600 ml      Physical Exam    General:   No resp difficulty HEENT: normal Neck: supple. JVP 11-12 . Carotids 2+ bilat; no bruits. No lymphadenopathy or thyromegaly appreciated. Cor: PMI nondisplaced. Tachy Regular rate & rhythm. No rubs, gallops or murmurs. Lungs: clear Abdomen: soft, nontender, nondistended. No hepatosplenomegaly. No bruits or masses. Good bowel sounds. Extremities: warm, no cyanosis, clubbing, rash, R and LLE 1+edema Neuro: alert & orientedx3, cranial nerves  grossly intact. moves all 4 extremities w/o difficulty. Affect pleasant   Telemetry   ST 100s   EKG    ST 113 bpm personally checked.   Labs   Basic Metabolic Panel: Recent Labs  Lab 06/29/22 0759  NA 136  K 2.8*  CL 105  CO2 19*  GLUCOSE 116*  BUN 12  CREATININE 1.24*  CALCIUM 8.6*    Liver Function Tests: Recent Labs  Lab 06/29/22 0759  AST 27  ALT 20  ALKPHOS 62  BILITOT 2.5*  PROT 6.7  ALBUMIN 3.5   Recent Labs  Lab 06/29/22 0759  LIPASE 25   No results for input(s): "AMMONIA" in the last 168 hours.  CBC: Recent Labs  Lab 06/29/22 0759  WBC 8.8  NEUTROABS  6.8  HGB 8.7*  HCT 27.5*  MCV 78.8*  PLT 444*    Cardiac Enzymes: No results for input(s): "CKTOTAL", "CKMB", "CKMBINDEX", "TROPONINI" in the last 168 hours.  BNP: BNP (last 3 results) Recent Labs    06/29/22 0759  BNP 618.6*    ProBNP (last 3 results) No results for input(s): "PROBNP" in the last 8760 hours.   CBG: No results for input(s): "GLUCAP" in the last 168 hours.  Coagulation Studies: No results for input(s): "LABPROT", "INR" in the last 72 hours.   Imaging   [place] EKG SITE RITE  Result Date: 06/29/2022 If Site Rite image not attached, placement could not be confirmed due to current cardiac rhythm.  ECHOCARDIOGRAM COMPLETE  Result Date: 06/29/2022    ECHOCARDIOGRAM REPORT   Patient Name:   Ann Robbins Date of Exam: 06/29/2022 Medical Rec #:  [id number]      Height:       67.0 in Accession #:    [id number]     Weight:       196.0 lb Date of Birth:  1991/05/22       BSA:          2.005 m Patient Age:    31 years       BP:           193/131 mmHg Patient Gender: F              HR:           119 bpm. Exam Location:  Inpatient Procedure: 2D Echo, Cardiac Doppler, Color Doppler and Intracardiac            Opacification Agent STAT ECHO Indications:    CHF-Acute Systolic I50.21  History:        Patient has no prior history of Echocardiogram examinations.                 Risk  Factors:Hypertension.  Sonographer:    Leta Jungling RDCS Referring Phys: [id number] [facility] B ROBERTS  Sonographer Comments: Subcostal imaging limited due to gastric pain. IMPRESSIONS  1. Left ventricular ejection fraction, by estimation, is 20 to 25%. The left ventricle has severely decreased function. The left ventricle demonstrates global hypokinesis. The left ventricular internal cavity size was moderately dilated. Left ventricular diastolic parameters are indeterminate. Elevated left atrial pressure.  2. Right ventricular systolic function is moderately reduced. The right ventricular size is normal. There is mildly elevated pulmonary artery systolic pressure.  3. Left atrial size was moderately dilated.  4. Right atrial size was mildly dilated.  5. A small pericardial effusion is present. The pericardial effusion is localized near the right atrium, anterior to the right ventricle and surrounding the apex.  6. The mitral valve is grossly normal. Severe mitral valve regurgitation. No evidence of mitral stenosis.  7. The aortic valve is tricuspid. Aortic valve regurgitation is not visualized. No aortic stenosis is present.  8. The inferior vena cava is dilated in size with <50% respiratory variability, suggesting right atrial pressure of 15 mmHg. Comparison(s): No prior Echocardiogram. FINDINGS  Left Ventricle: Left ventricular ejection fraction, by estimation, is 20 to 25%. The left ventricle has severely decreased function. The left ventricle demonstrates global hypokinesis. The left ventricular internal cavity size was moderately dilated. There is no left ventricular hypertrophy. Left ventricular diastolic parameters are indeterminate. Elevated left atrial pressure. Right Ventricle: The right ventricular size is normal. Right ventricular systolic function is moderately reduced. There is mildly elevated pulmonary artery systolic pressure.  The tricuspid regurgitant velocity is 2.71 m/s, and with an assumed right  atrial pressure of 15 mmHg, the estimated right ventricular systolic pressure is 31.5 mmHg. Left Atrium: Left atrial size was moderately dilated. Right Atrium: Right atrial size was mildly dilated. Pericardium: A small pericardial effusion is present. The pericardial effusion is localized near the right atrium, anterior to the right ventricle and surrounding the apex. Mitral Valve: The mitral valve is grossly normal. Severe mitral valve regurgitation. No evidence of mitral valve stenosis. MV peak gradient, 29.6 mmHg. The mean mitral valve gradient is 1.0 mmHg. Tricuspid Valve: The tricuspid valve is grossly normal. Tricuspid valve regurgitation is mild . No evidence of tricuspid stenosis. Aortic Valve: The aortic valve is tricuspid. Aortic valve regurgitation is not visualized. No aortic stenosis is present. Pulmonic Valve: The pulmonic valve was grossly normal. Pulmonic valve regurgitation is trivial. No evidence of pulmonic stenosis. Aorta: The aortic root and ascending aorta are structurally normal, with no evidence of dilitation. Venous: The inferior vena cava is dilated in size with less than 50% respiratory variability, suggesting right atrial pressure of 15 mmHg. IAS/Shunts: There is right bowing of the interatrial septum, suggestive of elevated left atrial pressure. The interatrial septum was not well visualized.  LEFT VENTRICLE PLAX 2D LVIDd:         6.20 cm      Diastology LVIDs:         5.10 cm      LV e' medial:    7.77 cm/s LV PW:         1.20 cm      LV E/e' medial:  15.3 LV IVS:        1.00 cm      LV e' lateral:   9.48 cm/s LVOT diam:     2.00 cm      LV E/e' lateral: 12.6 LV SV:         30 LV SV Index:   15 LVOT Area:     3.14 cm  LV Volumes (MOD) LV vol d, MOD A2C: 199.0 ml LV vol d, MOD A4C: 173.0 ml LV vol s, MOD A2C: 153.0 ml LV vol s, MOD A4C: 147.0 ml LV SV MOD A2C:     46.0 ml LV SV MOD A4C:     173.0 ml LV SV MOD BP:      37.9 ml RIGHT VENTRICLE RV S prime:     10.30 cm/s TAPSE (M-mode):  1.2 cm LEFT ATRIUM              Index        RIGHT ATRIUM           Index LA diam:        4.70 cm  2.34 cm/m   RA Area:     21.10 cm LA Vol (A2C):   75.6 ml  37.71 ml/m  RA Volume:   67.60 ml  33.72 ml/m LA Vol (A4C):   106.0 ml 52.87 ml/m LA Biplane Vol: 90.2 ml  44.99 ml/m  AORTIC VALVE LVOT Vmax:   83.50 cm/s LVOT Vmean:  48.800 cm/s LVOT VTI:    0.096 m  AORTA Ao Root diam: 3.10 cm Ao Asc diam:  3.10 cm MITRAL VALVE                  TRICUSPID VALVE MV Area (PHT): 6.09 cm       TR Peak grad:   29.4 mmHg MV Area VTI:  5.93 cm       TR Vmax:        271.00 cm/s MV Peak grad:  29.6 mmHg MV Mean grad:  1.0 mmHg       SHUNTS MV Vmax:       2.72 m/s       Systemic VTI:  0.10 m MV Vmean:      32.0 cm/s      Systemic Diam: 2.00 cm MV Decel Time: 125 msec MR Peak grad:    150.8 mmHg MR Mean grad:    96.0 mmHg MR Vmax:         614.00 cm/s MR Vmean:        458.0 cm/s MR PISA:         1.57 cm MR PISA Eff ROA: 9 mm MR PISA Radius:  0.50 cm MV E velocity: 119.00 cm/s Kirk Ruths MD Electronically signed by Kirk Ruths MD Signature Date/Time: 06/29/2022/2:35:16 PM    Final    DG Chest Port 1 View  Result Date: 06/29/2022 CLINICAL DATA:  Shortness of breath. EXAM: PORTABLE CHEST 1 VIEW COMPARISON:  Lung bases from abdominal CT earlier today. FINDINGS: Enlarged cardiopericardial silhouette, pericardial effusion on CT earlier today. Small bilateral pleural effusions. Vascular congestion with question of subtle septal thickening, suspicious for pulmonary edema. No pneumothorax. No acute osseous findings. IMPRESSION: 1. Enlarged cardiopericardial silhouette, pericardial effusion on CT earlier today. 2. Small bilateral pleural effusions. Vascular congestion with question of subtle septal thickening, suspicious for pulmonary edema. Electronically Signed   By: Keith Rake M.D.   On: 06/29/2022 12:48   US Abdomen Limited RUQ (LIVER/GB)  Result Date: 06/29/2022 CLINICAL DATA:  Right upper quadrant abdominal pain.  EXAM: ULTRASOUND ABDOMEN LIMITED RIGHT UPPER QUADRANT COMPARISON:  CT scan of same day. FINDINGS: Gallbladder: Status post cholecystectomy. Common bile duct: Diameter: 4 mm which is within normal limits. Liver: No focal lesion identified. Within normal limits in parenchymal echogenicity. Portal vein is patent on color Doppler imaging with normal direction of blood flow towards the liver. Other: None. IMPRESSION: Status post cholecystectomy. No acute abnormality seen in the right upper quadrant of the abdomen. Electronically Signed   By: Marijo Conception M.D.   On: 06/29/2022 11:50   CT ABDOMEN PELVIS W CONTRAST  Result Date: 06/29/2022 CLINICAL DATA:  Acute abdominal pain. EXAM: CT ABDOMEN AND PELVIS WITH CONTRAST TECHNIQUE: Multidetector CT imaging of the abdomen and pelvis was performed using the standard protocol following bolus administration of intravenous contrast. RADIATION DOSE REDUCTION: This exam was performed according to the departmental dose-optimization program which includes automated exposure control, adjustment of the mA and/or kV according to patient size and/or use of iterative reconstruction technique. CONTRAST:  32mL OMNIPAQUE IOHEXOL 350 MG/ML SOLN COMPARISON:  None Available. FINDINGS: Lower chest: Bibasilar ground-glass nodules in pleural effusions. Small pericardial effusion. Hepatobiliary: No focal hepatic lesion. Postcholecystectomy. No biliary duct dilatation. Common bile duct is normal. Pancreas: Pancreas is normal. No ductal dilatation. No pancreatic inflammation. Spleen: Normal spleen Adrenals/urinary tract: Adrenal glands and kidneys are normal. The ureters and bladder normal. Stomach/Bowel: Stomach, small bowel, appendix, and cecum are normal. The colon and rectosigmoid colon are normal. Vascular/Lymphatic: Abdominal aorta is normal caliber. No periportal or retroperitoneal adenopathy. No pelvic adenopathy. 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 Ovaries normal. Other: No free fluid. Musculoskeletal: No aggressive osseous lesion. IMPRESSION: 1. Bibasilar ground-glass nodules, pleural effusion and pericardial effusion. Findings concerning for pulmonary edema of unclear etiology.  Recommend CT of the thorax. 2. No acute findings abdomen pelvis. Electronically Signed   By: Suzy Bouchard M.D.   On: 06/29/2022 10:37     Medications:     Current Medications:  enoxaparin (LOVENOX) injection  40 mg Subcutaneous Q24H   furosemide  40 mg Intravenous BID   hydrALAZINE  25 mg Oral Q8H   potassium chloride  40 mEq Oral BID    Infusions:  nitroGLYCERIN 5 mcg/min (06/29/22 1455)      Patient Profile    Ann Huckeby is a 19 year with a history of cholecystectomy 2016, gestational hypertension with her first child  and has remained hypertensive since that time.She has 3 children ages 50,9, and 5 and had tubal ligation 2018.   Admitted with hypertensive urgency and acute systolic heart failure.   Assessment/Plan   1. Acute Systolic Heart Failure Gestational hypertension with her first child 14 years ago. Remained hypertensive with 2 subsequent pregnancies. Stopped HTN medications ~ 1 year ago. Suspect hypertensive cardiomyopathy. TSH pending. Doubt ICM. Possible familial component Mom recently diagnosed with heart failure. HS Trop 110>232 BNP 618.  -Echo EF 20-25% RV moderately reduced.  - On exam she is volume overloaded. Continue IV lasix.  - Remains hypertensive. On NTG drip and hydralazine 25 mg tid - Add Entresto 49-51 mg twice a day  - Add 12.5 mg  spiro  - Will need to start GDMT with close follow up.  - Of note she has had tubal ligation.  PICC ordered for CVP/CO-OX   2. Hypertensive Urgency  Started on NTG drip.  On Hydralazine 25 mg three times a day.  - see above.  - Check renal US.  - Check aldoseterone, renin, PRA  - Needs sleep study once she gets insurance.   3. Sinus Tach  Check TSH    4. Hypokalemia  K 2.7 on admit.  Replace K   5.  Anemia  Hgb 8.7.  No obvious source.  Check anemia panel.  Would not give Feraheme until procedures completed. May need  cardiac MRI.   6. Social  Uninsured and will need medications at the time of discharge.     Length of Stay: 0  Darrick Grinder, NP  06/29/2022, 3:18 PM  Advanced Heart Failure Team Pager [phone] (M-F; 7a - 5p)  Please contact [facility] for night-coverage after hours (4p -7a ) and weekends on [email or web address]   Patient seen and examined with the above-signed Advanced Practice Provider and/or Housestaff. I personally reviewed laboratory data, imaging studies and relevant notes. I independently examined the patient and formulated the important aspects of the plan. I have edited the note to reflect any of my changes or salient points. I have personally discussed the plan with the patient and/or family.  32 y/o woman with history of HTN (out of meds x 1 year) admitted with several weeks of new onset HF.   Echo EF 20-25%. BP markedly elevated. QRs narrow + snoring  General:  Sitting up in bed. No resp difficulty HEENT: normal Neck: supple. JVP to ear Carotids 2+ bilat; no bruits. No lymphadenopathy or thryomegaly appreciated. Cor: PMI nondisplaced. Regular tachy  +s3 Lungs: clear Abdomen: obese soft, nontender, nondistended. No hepatosplenomegaly. No bruits or masses. Good bowel sounds. Extremities: no cyanosis, clubbing, rash, 2-3+ edema Neuro: alert & orientedx3, cranial nerves grossly intact. moves all 4 extremities w/o difficulty. Affect pleasant  She has new onset HF. Suspect HTN in nature. BP markedly elevated. Volume overloaded. K low  PRA level to be drawn to evaluate for hyperaldosteronism. Once drawn. Start Entresto and spiro. Diurese. cMRI this admit. Low suspicion for CAD so likely doesn't need cath. Will need outpatient sleep study.   Arvilla Meresaniel Letonia Stead, MD  4:21 PM

## 2022-06-29 NOTE — Progress Notes (Signed)
FMTS Brief Progress Note  Cardiology provider says patient needs to stay on FM service overnight. Spoke to nursing staff who said they were well-equipped to care for the patient on the [facility] unit.  S: Patient says she feels "scared." She endorses mild SOB especially when ambulating. She also endorses abdominal tenderness. She denies chest pain or sensation of retained fluid in bilateral LEs.  O: BP (!) 158/90 (BP Location: Right Arm)   Pulse (!) 111   Temp 98.6 F (37 C) (Oral)   Resp 18   Ht 5\' 7"  (1.702 m)   Wt 93.7 kg   SpO2 97%   BMI 32.35 kg/m   General: well-appearing and in no acute distress HEENT: normocephalic and atraumatic Respiratory: CTAB posteriorly, on RA, and tachypneic Extremities: moving all extremities spontaneously Gastrointestinal: non-distended and mildly tender to palpation on bilateral upper quadrants Cardiovascular: tachycardic  A/P: - Orders reviewed. Labs for AM ordered, which was adjusted as needed.  Camelia Phenes, MD 06/29/2022, 10:26 PM PGY-1, [facility] Night Resident  Please page [phone] with questions.

## 2022-06-29 NOTE — Assessment & Plan Note (Signed)
>>  ASSESSMENT AND PLAN FOR HYPERTENSION WRITTEN ON 07/04/2022  8:10 AM BY Camelia Phenes, MD  Soft Bps overnight. Renal artery ultrasound negative for stenosis. HTN etiology still unclear could be due to hyperaldosteronism or possibly related to OSA.  - f/u 5 HIAA urine, aldosterone plus renin activity ratio, urine catecholamines, urine cortisol, urine metanephrines.

## 2022-06-29 NOTE — ED Triage Notes (Signed)
Pt here with diffuse abdominal pain onset 1 week ago that has been worsening. Pt also complains of nausea and shob for the last week. Denies emesis, diarrhea or dysuria.

## 2022-06-29 NOTE — Hospital Course (Addendum)
Ann Robbins is a 32 y.o. female who was admitted for new onset HFrEF and hypertensive emergency. Prior PMH of HTN, otherwise no known medical problems.  New Onset HFrEF Presented with subacute (~6 weeks) DOE and abdominal pain. Imaging revealed pulmonary edema, small bilateral pleural effusions, and pericardial effusion concerning for CHF. BNP 618.6 on admission. Echo was obtained and showed EF 20-25%. 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 Heart failure team followed patient throughout hospitalization. She was diuresed and started on GDMT. Meds at discharge include: ***  Hypertensive Emergency BP severely elevated on admission. Had been off her meds for several months due to financial constraints. Cardiology was consulted. Treated with nitro drip initially. Meds at discharge *** Workup for secondary causes of hypertension showed ***. (ultrasound negative for renal artery stenosis)   Hypokalemia    Anemia Iron studies consistent with iron deficiency anemia (Fe 18, ferritin 20, Tsat 5). Treated with IV iron ***   PCP f/u items: -Needs sleep study

## 2022-06-29 NOTE — Progress Notes (Signed)
   06/29/22 1945  Assess: MEWS Score  Temp 98.6 F (37 C)  BP (!) 163/112  Pulse Rate (!) 123 (pt is getting up very frequently to urinate, 24 hr urine collection)  Resp 18  SpO2 97 %  O2 Device Room Air  Assess: MEWS Score  MEWS Temp 0  MEWS Systolic 0  MEWS Pulse 2  MEWS RR 0  MEWS LOC 0  MEWS Score 2  MEWS Score Color Yellow  Assess: if the MEWS score is Yellow or Red  Were vital signs taken at a resting state? No  Focused Assessment No change from prior assessment  Does the patient meet 2 or more of the SIRS criteria? No  MEWS guidelines implemented  No, previously yellow, continue vital signs every 4 hours  Assess: SIRS CRITERIA  SIRS Temperature  0  SIRS Pulse 1  SIRS Respirations  0  SIRS WBC 0  SIRS Score Sum  1

## 2022-06-29 NOTE — Assessment & Plan Note (Addendum)
HS TP 110 >>232.  No chest pain, but endorsed nausea and abdominal pain.  EKG on presentation, did not show STEMI.  Suspect demand ischemia from acute CHF.  -Cardiology following, appreciate recs

## 2022-06-29 NOTE — Progress Notes (Signed)
I saw and examined this patient.  I discussed with the full resident admitting team.  We have agreed on a management plan.  We are currently coordinating with the cardiology inpatient team as to who best admit this patient.  I will cosign our H&PE or consult note when it is available.  Issues 32 yo female with subacute (~6 weeks) of DOE and acute Epigastric abd pain Right > left.  Not much medical care.  Known HBP but has been unable to afford medications and followup with her family planning medicaid.  She has not investigated coverage under medicaid expansion.  Work up thus far has found marked hypertentsion and HFrEF plus more. Hypertensive urgency/emergency.  I interpret her elevated trops as myocardial strain and belief the explanation for her HFrEF is hypertensive etiology.  Thus she has (acute?) end organ damage.  Prompt lowering of BP is in order.  I understand cards has recommended a nitroglycerin drip once labs are collected.   Longstanding hypertension could be primary or secondary.  Her youth, the severity of elevation, and her pretreatment hypkalemia all point to the strong possibility of a secondary cause of hypertension. HFrEF.  Hypertensive cardiomyopathy most likely.  Post viral cardiomyopathy is possible as well as other unusual cardiomyopathies.  Ischemic cariomyopathy less likely given young age unless we uncover a familial hypercholesterolemia. Hypokalemia, as above.  Could point to secondary cause of hypertension.   Increased trops likely due to demand ischemia.  Doubt ACS.   Bump in creat with no recent comparitors.  Unclear if this is CKD from hypertension or AKI.  Renal function may worsen some as we bring the BP under control from the current sky high levels.

## 2022-06-29 NOTE — Assessment & Plan Note (Addendum)
ECHO shows patient with EF of 20-25%. She has pleural effusions, pulmonary edema and pericardial effusion. Weight is down ~ 10 lbs since yesterday, net decrease od 2 L. and Cr has stayed stable. Most likely developed HF due to uncontrolled HTN.  - Cardiology consulted, appreciate their recommendations - Await initiating Bblocker until completion of hyperaldosteronism labs  - Continue 40 mg IV lasix BID  - Start 25 mg spironolactone daily  - Cardiac telemetry - Strict I's and O's - Daily weights

## 2022-06-29 NOTE — H&P (Addendum)
Hospital Admission History and Physical Service Pager: [phone]  Patient name: Ann Robbins Medical record number: [id number] Date of Birth: [birth year] Age: 32 y.o. Gender: female  Primary Care Provider: Charlott Rakes, MD Consultants: Cardiology Code Status: Full Code   Preferred Emergency Contact:  Norma Fredrickson [phone]  Chief Complaint: Abdominal pain  Assessment and Plan: Ann Robbins is a 32 y.o. female presenting with abdominal pain and shortness of breath.  Found on CXR/CT abdomen and labs to be in heart failure. Etiology of this heart failure likely 2/2 poor hypertension control, also considered CAD, valvular heart disease, and post viral cardiomyopathy, and thyroid disorder.   * Acute CHF (congestive heart failure) (HCC) Elevated troponins [id number]), tachycardic, elevated BNP (618), distended abdomen and evidence of pulmonary edema on CXR/CT abdomen.  No peripheral edema, skin is warm and dry. Patient is not currently pregnant, last child 5 years ago.  Currently believe heart failure secondary to poorly controlled hypertension (see below).  Cardiology has seen patient, and provided recommendations-we greatly appreciate their input on this case. Per cardiology, patient may need to be admitted to cardiac ICU if ejection fraction is 20% or less.  - Admit to [facility] progressive, attending Dr. Andria Frames - Cardiology consulted, appreciate their recommendations - Echo pending, avoid beta-blocker therapy until echo resulted - Consider diuresis - Check lactic acid - Cardiac telemetry - Strict I's and O's - Daily weights  AKI (acute kidney injury) ([place]) Creatinine 1.24, baseline 0.9 2 years ago.  Likely prerenal in setting of acute congestive heart failure.  Caution with fluids. Will consider diureses. - Caution with nephrotoxic agents -consider diureses   Anemia Hemoglobin of 8.7.  Appears microcytic, chronic.  Suspect iron deficiency w/ MCV 78.8. Patient transfusion  threshold of 8, given cardiac disease.  Plan to discuss with patient. -Consider iron studies -Consider iron supplementation - Transfuse for Hgb < 8  Hypokalemia K+ 2.8 on admission-unclear etiology, does raise suspicion for secondary causes of hypertension as above. - 40 mEq p.o. potassium  Tachycardia Patient significantly tachypneic, tachycardic with SOB.  Concern for PE, CT angio for PE rule out pending.  Suspect tachycardia is secondary to acute congestive heart failure if PE is negative. - Pending CT angio - Pending echocardiogram  Hypertensive urgency Per patient: diagnosed with HTN after pregnancy with first child 15 years ago, and has had hypertension ever since.  Last pregnancy was 5 years ago.  She reports that she was on amlodipine and hydrochlorothiazide, but has not taken these medications in "a long time."  On chart review, patient appears to have elevated blood pressure at multiple encounters.  Interestingly, patient has hypokalemia on presentation (not taking HCTZ)- raising suspicion of secondary hypertension.  Cardiology has seen patient, and provided recommendations-greatly appreciate their input in this case.  Laboratory studies, including renal ultrasound will be obtained to assess for secondary causes.  Patient endorses symptoms of OSA, and may benefit from outpatient sleep study. -5 HIAA urine, aldosterone plus renin activity ratio, urine catecholamines, urine cortisol, urine metanephrines - Avoid beta-blockers until urine studies obtained - Pending renal Doppler - IV Nitro, hydralazine 25 mg TID  Elevated troponin HS TP 110 >>232.  No chest pain, but endorses nausea and abdominal pain.  EKG on presentation, did not show STEMI.  Suspect demand ischemia from acute CHF.  Will obtain repeat EKG. - N.p.o., sips with meds - Trend troponin   FEN/GI: N.p.o. sips with meds VTE Prophylaxis: Lovenox  Disposition: Progressive  History of Present Illness:  Ann Robbins is  a 32 y.o. female presenting with abdominal pain  Started having SOB and then started having stomach pain in upper abdomen and lower quadrants (following colon), also notes dry cough that makes the pain worse. Has been going on for a week.    Hard time breathing for the last 2 weeks. Yesterday if she walked to the bathroom she would be out of breath. Also notes that she has had vomitting, last night was last time she ate. Has otherwise been in normal state of health.  Denies fever and chills, has to prop self up to breath for the last 2 weeks. Also notes waking up gasping for breath, with fuller/hard belly, no swelling in the legs. Denies any diarrhea, or constipation.   Has been told she snores, and wakes up from snoring, never had sleep study, will f/u outpt.  Review Of Systems: Per HPI  Pertinent Past Medical History: HTN  Remainder reviewed in history tab.   Pertinent Past Surgical History: Tubal ligation Cholescesectomy  Remainder reviewed in history tab.  Pertinent Social History: Tobacco use: No Alcohol use: No Other Substance use: No Lives with Kids x3 and boyfriend  Pertinent Family History: CHF - Mother HTN   Remainder reviewed in history tab.   Important Outpatient Medications: None  Remainder reviewed in medication history.   Objective: BP (!) 166/110   Pulse (!) 123 Comment: pt is getting up very frequently to urinate, 24 hr urine collection  Temp 98.1 F (36.7 C) (Oral)   Resp 18   Ht 5' 7"$  (1.702 m)   Wt 93.7 kg   SpO2 95%   BMI 32.35 kg/m  Exam: General: Acute distress, ill-appearing Cardiovascular: Tachycardic, no murmur Respiratory: Tachypneic, speaks in short sentences O2 sat greater than 95% on room air, CTAB Gastrointestinal: Diffuse abdominal pain, mildly distended. Derm: Warm and dry Neuro: Alert and oriented x 4  Labs:  CBC BMET  Recent Labs  Lab 06/29/22 0759  WBC 8.8  HGB 8.7*  HCT 27.5*  PLT 444*   Recent Labs  Lab  06/29/22 0759  NA 136  K 2.8*  CL 105  CO2 19*  BUN 12  CREATININE 1.24*  GLUCOSE 116*  CALCIUM 8.6*     Troponin I: 110>> 232 BNP: 618.6 UA: No blood, low suspicion of infection. Lipase: 25 hCG: <5.0  EKG:  Initial EKG showing sinus tachycardia, no STEMI.  Imaging Studies Performed:  ECHOCARDIOGRAM COMPLETE Result Date: 06/29/2022  IMPRESSIONS  1. Left ventricular ejection fraction, by estimation, is 20 to 25%. The left ventricle has severely decreased function. The left ventricle demonstrates global hypokinesis. The left ventricular internal cavity size was moderately dilated. Left ventricular diastolic parameters are indeterminate. Elevated left atrial pressure.  2. Right ventricular systolic function is moderately reduced. The right ventricular size is normal. There is mildly elevated pulmonary artery systolic pressure.  3. Left atrial size was moderately dilated.  4. Right atrial size was mildly dilated.  5. A small pericardial effusion is present. The pericardial effusion is localized near the right atrium, anterior to the right ventricle and surrounding the apex.  6. The mitral valve is grossly normal. Severe mitral valve regurgitation. No evidence of mitral stenosis.  7. The aortic valve is tricuspid. Aortic valve regurgitation is not visualized. No aortic stenosis is present.  8. The inferior vena cava is dilated in size with <50% respiratory variability, suggesting right atrial pressure of 15 mmHg. Comparison(s): No prior Echocardiogram.   DG Chest Port 1  View Result Date: 06/29/2022 IMPRESSION: 1. Enlarged cardiopericardial silhouette, pericardial effusion on CT earlier today. 2. Small bilateral pleural effusions. Vascular congestion with question of subtle septal thickening, suspicious for pulmonary edema.   US Abdomen Limited RUQ (LIVER/GB) Result Date: 06/29/2022  IMPRESSION: Status post cholecystectomy. No acute abnormality seen in the right upper quadrant of the abdomen.    CT ABDOMEN PELVIS W CONTRAST Result Date: 06/29/2022 IMPRESSION: 1. Bibasilar ground-glass nodules, pleural effusion and pericardial effusion. Findings concerning for pulmonary edema of unclear etiology. Recommend CT of the thorax. 2. No acute findings abdomen pelvis.     Leslie Dales, DO 06/29/2022, 3:15 PM PGY-1, [place] Intern pager: [phone], text pages welcome Secure chat group [facility] [place]    I was personally present and performed or re-performed the history, physical exam and medical decision making activities of this service and have verified that the service and findings are accurately documented in the student's note.  Holley Bouche, MD                  06/29/2022, 7:51 PM

## 2022-06-29 NOTE — ED Provider Notes (Signed)
[facility] Provider Note   CSN: [id number] Arrival date & time: 06/29/22  9563     History  Chief Complaint  Patient presents with   Abdominal Pain    Ann Robbins is a 32 y.o. female.  32 year old female with prior medical history as detailed below presents for evaluation.  Patient complains of diffuse upper abdominal pain x 1 week.  Patient reports worsening of pain over the last 24 hours.  She reports associated nausea without vomiting.  She denies diarrhea or change in bowel movement.  She denies difficulty urinating.  She reports prior history of abdominal surgery including tubal ligation and cholecystectomy.  She reports prior history of hypertension.  She reports that she has not been taking any of her blood pressure medications or other medications for at least several months.    The history is provided by the patient and medical records.       Home Medications Prior to Admission medications   Medication Sig Start Date End Date Taking? Authorizing Provider  amLODipine (NORVASC) 10 MG tablet Take 1 tablet (10 mg total) by mouth daily. 11/01/20   Charlott Rakes, MD  benzonatate (TESSALON) 100 MG capsule Take 1 capsule (100 mg total) by mouth every 8 (eight) hours. 05/24/22   Rafoth, Dorian Pod, MD  hydrochlorothiazide (HYDRODIURIL) 25 MG tablet Take 1 tablet (25 mg total) by mouth daily. 12/14/20   Charlott Rakes, MD  prenatal vitamin w/FE, FA (PRENATAL 1 + 1) 27-1 MG TABS tablet Take 1 tablet by mouth daily Patient not taking: No sig reported 02/19/17   Anyanwu, Sallyanne Havers, MD  promethazine-dextromethorphan (PROMETHAZINE-DM) 6.25-15 MG/5ML syrup Take 5 mLs by mouth 3 (three) times daily as needed for cough. 03/25/21   Raspet, Derry Skill, PA-C      Allergies    Patient has no known allergies.    Review of Systems   Review of Systems  All other systems reviewed and are negative.   Physical Exam Updated Vital Signs BP (!)  200/139 (BP Location: Right Arm)   Pulse (!) 130   Temp 98.6 F (37 C) (Oral)   Resp (!) 30   SpO2 100%  Physical Exam Vitals and nursing note reviewed.  Constitutional:      General: She is not in acute distress.    Appearance: Normal appearance. She is well-developed.  HENT:     Head: Normocephalic and atraumatic.  Eyes:     Conjunctiva/sclera: Conjunctivae normal.     Pupils: Pupils are equal, round, and reactive to light.  Cardiovascular:     Rate and Rhythm: Normal rate and regular rhythm.     Heart sounds: Normal heart sounds.  Pulmonary:     Effort: Pulmonary effort is normal. No respiratory distress.     Comments: Decreased breath sounds at bilateral bases Abdominal:     General: There is no distension.     Palpations: Abdomen is soft.     Tenderness: There is abdominal tenderness in the right upper quadrant, epigastric area and left upper quadrant.  Musculoskeletal:        General: No deformity. Normal range of motion.     Cervical back: Normal range of motion and neck supple.  Skin:    General: Skin is warm and dry.  Neurological:     General: No focal deficit present.     Mental Status: She is alert and oriented to person, place, and time.     ED Results /  Procedures / Treatments   Labs (all labs ordered are listed, but only abnormal results are displayed) Labs Reviewed  RESP PANEL BY RT-PCR (RSV, FLU A&B, COVID)  [id number]  CBC WITH DIFFERENTIAL/PLATELET  COMPREHENSIVE METABOLIC PANEL  LIPASE, BLOOD  URINALYSIS, ROUTINE W REFLEX MICROSCOPIC  I-STAT BETA HCG BLOOD, ED ([facility], [facility], [facility] ONLY)    EKG EKG Interpretation  Date/Time:  Thursday June 29 2022 07:55:16 EST Ventricular Rate:  128 PR Interval:  145 QRS Duration: 94 QT Interval:  323 QTC Calculation: 472 R Axis:   72 Text Interpretation: Sinus tachycardia Borderline T wave abnormalities Baseline wander in lead(s) II III aVL aVF V2 V3 V4 V5 Confirmed by Dene Gentry [phone]) on 06/29/2022 7:59:25  AM  Radiology No results found.  Procedures Procedures    Medications Ordered in ED Medications  sodium chloride 0.9 % bolus 500 mL (has no administration in time range)  morphine (PF) 4 MG/ML injection 4 mg (has no administration in time range)  ondansetron (ZOFRAN) injection 4 mg (has no administration in time range)    ED Course/ Medical Decision Making/ A&P                             Medical Decision Making Amount and/or Complexity of Data Reviewed Labs: ordered. Radiology: ordered.  Risk Prescription drug management. Decision regarding hospitalization.    Medical Screen Complete  This patient presented to the ED with complaint of abdominal pain.  This complaint involves an extensive number of treatment options. The initial differential diagnosis includes, but is not limited to, intra-abdominal pathology such as obstruction, retained gallbladder stone, pancreatitis, metabolic abnormality, etc.  This presentation is: Acute, Self-Limited, Previously Undiagnosed, Uncertain Prognosis, Complicated, Systemic Symptoms, and Threat to Life/Bodily Function   Patient with longstanding history of hypertension presents with multiple complaints.  Initial chief complaint is right upper quadrant and epigastric abdominal discomfort.  This has been ongoing for 1 week.  Initial CT abdomen pelvis with IV contrast does not reveal acute abnormality in the abdomen.  However, bilateral pleural effusions and trace pericardial effusion seen on CT abdomen pelvis.  While discussing findings of CT abdomen pelvis patient does admit that she has had increasing exertional shortness of breath for the last 2 to 3 weeks.  She denies prior history of CHF.  Additional workup revealed elevated BNP and elevated troponin.  Cardiology made aware of case and will consult.  Additional abnormalities found on workup include hemoglobin 8.7 and potassium of 2.8.  Case discussed with admitting service.   Patient would benefit from admission for treatment of likely CHF and uncontrolled hypertension.  Discussed additional imaging including CT angio of chest to rule out PE.  Admitting service requested that they evaluate patient prior to placing order for CT angio.     Co morbidities that complicated the patient's evaluation  Uncontrolled hypertension   Additional history obtained:  External records from outside sources obtained and reviewed including prior ED visits and prior Inpatient records.    Lab Tests:  I ordered and personally interpreted labs.  The pertinent results include: CBC, CMP, lipase, BNP, troponin, COVID, flu   Imaging Studies ordered:  I ordered imaging studies including chest x-ray, CT abdomen pelvis, right upper quadrant ultrasound I independently visualized and interpreted obtained imaging which showed bilateral pleural effusions I agree with the radiologist interpretation.   Cardiac Monitoring:  The patient was maintained on a cardiac monitor.  I personally viewed and interpreted  the cardiac monitor which showed an underlying rhythm of: Sinus tach   Problem List / ED Course:  Shortness of breath, abdominal pain, elevated troponin, elevated BNP, anemia, hypokalemia, pleural effusions   Reevaluation:  After the interventions noted above, I reevaluated the patient and found that they have: improved  Disposition:  After consideration of the diagnostic results and the patients response to treatment, I feel that the patent would benefit from admission.          Final Clinical Impression(s) / ED Diagnoses Final diagnoses:  Right upper quadrant abdominal pain  Shortness of breath    Rx / DC Orders ED Discharge Orders     None         Valarie Merino, MD 06/29/22 [phone]

## 2022-06-29 NOTE — Consult Note (Addendum)
Cardiology Consultation   Patient ID: Ann Robbins MRN: [id number]; DOB: Mar 25, 1991  Admit date: 06/29/2022 Date of Consult: 06/29/2022  PCP:  Ann Robbins, [facility] Cardiologist:  New  Patient Profile:   Ann Robbins is a 32 y.o. female with a hx of hypertension who is being seen 06/29/2022 for the evaluation of CHF at the request of Dr. Francia Greaves.  History of Present Illness:   Ann Robbins is a 32 year old female with past medical history noted above.  She reports developing gestational hypertension with her first pregnancy.  Has 3 children. States since that time her blood pressure has never normalized.  She has previously been on amlodipine and HCTZ. Has run out of her medications and not been on anything for several months.  She is followed in the internal medicine clinic.  She presents to the ED with complaints of abdominal pain/fullness for the past several days.  Also reports orthopnea and PND. Increased dyspnea on exertion with minimal activity. States that she has been told she snores.  Denies significant lower extremity edema.   Admission labs in the ED showed sodium 136, potassium 2.8, creatinine 1.2, BNP 618, high-sensitivity troponin 110>>232, WBC 8.8, hemoglobin 8.7, MVC 78, MCH 24, platelets 444.  hcG negative, flu/respiratory panel negative.  Chest x-ray with cardiac enlargement, small bilateral pleural effusions.  CT abdomen pelvis with bibasilar groundglass nodules, pleural effusion and pericardial effusion concerning for pulmonary edema.  EKG with poor baseline but sinus tachycardia, 128 bpm, LVH.  Cardiology asked to evaluate.  Past Medical History:  Diagnosis Date   Hypertension 2015   First with PIH and bp remained high after delivery    Past Surgical History:  Procedure Laterality Date   CHOLECYSTECTOMY  02/2015   Laparoscopic   TUBAL LIGATION Bilateral 05/21/2017   Procedure: POST PARTUM TUBAL LIGATION;  Surgeon: Woodroe Mode, MD;  Location: [facility];  Service: Gynecology;  Laterality: Bilateral;     Home Medications:  Prior to Admission medications   Medication Sig Start Date End Date Taking? Authorizing Provider  amLODipine (NORVASC) 10 MG tablet Take 1 tablet (10 mg total) by mouth daily. 11/01/20   Ann Rakes, MD  benzonatate (TESSALON) 100 MG capsule Take 1 capsule (100 mg total) by mouth every 8 (eight) hours. 05/24/22   Rafoth, Dorian Pod, MD  hydrochlorothiazide (HYDRODIURIL) 25 MG tablet Take 1 tablet (25 mg total) by mouth daily. 12/14/20   Ann Rakes, MD  prenatal vitamin w/FE, FA (PRENATAL 1 + 1) 27-1 MG TABS tablet Take 1 tablet by mouth daily Patient not taking: Reported on 11/01/2020 02/19/17   Anyanwu, Sallyanne Havers, MD  promethazine-dextromethorphan (PROMETHAZINE-DM) 6.25-15 MG/5ML syrup Take 5 mLs by mouth 3 (three) times daily as needed for cough. 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  Tobacco Use   Smoking status: Never   Smokeless tobacco: Never  Substance and Sexual Activity   Alcohol use: No   Drug use: No   Sexual activity: Yes    Birth control/protection: None  Other Topics Concern  Not on file  Social History Narrative   Originally from [place] in March of [place]   Lives at home with boyfriend, Ann Robbins and their 2 children   Social Determinants of Health   Financial Resource Strain: Not on file  Food Insecurity: Not on file  Transportation Needs: Not on file  Physical Activity: Not on file  Stress: Not on file  Social Connections: Not on file  Intimate Partner Violence:  Not on file    Family History:    Family History  Problem Relation Age of Onset   Hypertension Mother      ROS:  Please see the history of present illness.   All other ROS reviewed and negative.     Physical Exam/Data:   Vitals:   06/29/22 1100 06/29/22 1130 06/29/22 1158 06/29/22 1225  BP:    (!) 185/134  Pulse: (!) 112 (!) 111  (!) 118  Resp: (!) 28 15  (!) 48  Temp:   98.3 F (36.8 C)   TempSrc:   Oral   SpO2: 100% 95%  97%  Weight:      Height:        Intake/Output Summary (Last 24 hours) at 06/29/2022 1340 Last data filed at 06/29/2022 1155 Gross per 24 hour  Intake 1600 ml  Output --  Net 1600 ml      06/29/2022    8:22 AM 12/14/2020    8:57 AM 11/01/2020    3:22 PM  Last 3 Weights  Weight (lbs) 196 lb 229 lb 3.2 oz 231 lb  Weight (kg) 88.905 kg 103.964 kg 104.781 kg     Body mass index is 30.7 kg/m.  General:  Well nourished, well developed, in no acute distress HEENT: normal Neck: no JVD Vascular: No carotid bruits; Distal pulses 2+ bilaterally Cardiac:  normal S1, S2; RRR; no murmur  Lungs:  clear to auscultation bilaterally, no wheezing, rhonchi or rales  Abd: soft, nontender, no hepatomegaly  Ext: no edema Musculoskeletal:  No deformities, BUE and BLE strength normal and equal Skin: warm and dry  Neuro:  CNs 2-12 intact, no focal abnormalities noted Psych:  Normal affect   EKG:  The EKG was personally reviewed and demonstrates:  Sinus Tachycardia, 128 bpm, LVH  Relevant CV Studies:  Echo: pending  Laboratory Data:  High Sensitivity Troponin:   Recent Labs  Lab 06/29/22 0759  TROPONINIHS 110*     Chemistry Recent Labs  Lab 06/29/22 0759  NA 136  K 2.8*  CL 105  CO2 19*  GLUCOSE 116*  BUN 12  CREATININE 1.24*  CALCIUM 8.6*  GFRNONAA 59*  ANIONGAP 12    Recent Labs  Lab 06/29/22 0759  PROT 6.7  ALBUMIN 3.5  AST 27  ALT 20  ALKPHOS 62  BILITOT 2.5*   Lipids No results for input(s): "CHOL", "TRIG", "HDL", "LABVLDL",  "LDLCALC", "CHOLHDL" in the last 168 hours.  Hematology Recent Labs  Lab 06/29/22 0759  WBC 8.8  RBC 3.49*  HGB 8.7*  HCT 27.5*  MCV 78.8*  MCH 24.9*  MCHC 31.6  RDW 17.1*  PLT 444*   Thyroid No results for input(s): "TSH", "FREET4" in the last 168 hours.  BNP Recent Labs  Lab 06/29/22 0759  BNP 618.6*    DDimer No results for input(s): "DDIMER" in the last 168 hours.   Radiology/Studies:  [facility] Chest Port 1 View  Result Date: 06/29/2022 CLINICAL DATA:  Shortness of breath. EXAM: PORTABLE CHEST 1  VIEW COMPARISON:  Lung bases from abdominal CT earlier today. FINDINGS: Enlarged cardiopericardial silhouette, pericardial effusion on CT earlier today. Small bilateral pleural effusions. Vascular congestion with question of subtle septal thickening, suspicious for pulmonary edema. No pneumothorax. No acute osseous findings. IMPRESSION: 1. Enlarged cardiopericardial silhouette, pericardial effusion on CT earlier today. 2. Small bilateral pleural effusions. Vascular congestion with question of subtle septal thickening, suspicious for pulmonary edema. Electronically Signed   By: Keith Rake M.D.   On: 06/29/2022 12:48   US Abdomen Limited RUQ (LIVER/GB)  Result Date: 06/29/2022 CLINICAL DATA:  Right upper quadrant abdominal pain. EXAM: ULTRASOUND ABDOMEN LIMITED RIGHT UPPER QUADRANT COMPARISON:  CT scan of same day. FINDINGS: Gallbladder: Status post cholecystectomy. Common bile duct: Diameter: 4 mm which is within normal limits. Liver: No focal lesion identified. Within normal limits in parenchymal echogenicity. Portal vein is patent on color Doppler imaging with normal direction of blood flow towards the liver. Other: None. IMPRESSION: Status post cholecystectomy. No acute abnormality seen in the right upper quadrant of the abdomen. Electronically Signed   By: Marijo Conception M.D.   On: 06/29/2022 11:50   CT ABDOMEN PELVIS W CONTRAST  Result Date: 06/29/2022 CLINICAL DATA:  Acute abdominal  pain. EXAM: CT ABDOMEN AND PELVIS WITH CONTRAST TECHNIQUE: Multidetector CT imaging of the abdomen and pelvis was performed using the standard protocol following bolus administration of intravenous contrast. RADIATION DOSE REDUCTION: This exam was performed according to the departmental dose-optimization program which includes automated exposure control, adjustment of the mA and/or kV according to patient size and/or use of iterative reconstruction technique. CONTRAST:  23mL OMNIPAQUE IOHEXOL 350 MG/ML SOLN COMPARISON:  None Available. FINDINGS: Lower chest: Bibasilar ground-glass nodules in pleural effusions. Small pericardial effusion. Hepatobiliary: No focal hepatic lesion. Postcholecystectomy. No biliary duct dilatation. Common bile duct is normal. Pancreas: Pancreas is normal. No ductal dilatation. No pancreatic inflammation. Spleen: Normal spleen Adrenals/urinary tract: Adrenal glands and kidneys are normal. The ureters and bladder normal. Stomach/Bowel: Stomach, small bowel, appendix, and cecum are normal. The colon and rectosigmoid colon are normal. Vascular/Lymphatic: Abdominal aorta is normal caliber. No periportal or retroperitoneal adenopathy. No pelvic adenopathy. 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 Ovaries normal. Other: No free fluid. Musculoskeletal: No aggressive osseous lesion. IMPRESSION: 1. Bibasilar ground-glass nodules, pleural effusion and pericardial effusion. Findings concerning for pulmonary edema of unclear etiology. Recommend CT of the thorax. 2. No acute findings abdomen pelvis. Electronically Signed   By: Suzy Bouchard M.D.   On: 06/29/2022 10:37     Assessment and Plan:   Ann Robbins is a 32 y.o. female with a hx of hypertension who is being seen 06/29/2022 for the evaluation of CHF at the request of Dr. Francia Greaves.  Hypertensive urgency -- Reports she was initially diagnosed with hypertension after being pregnant  with her first child.  Has been on medications in the past but ran out and has not been on anything for quite some time.  Previously on amlodipine and HCTZ. Systolic blood pressures in the 200s on arrival -- Add IV nitro, hydralazine 25mg  TID -- check urine studies, no BB therapy until urine studies obtained  -- check renal artery duplex  Acute CHF -- etiology unknown, suspect from uncontrolled HTN. She is warm and dry on exam. Tachycardic, but hypertensive currently. Received 1.5L NS while in the ED.  -- CXR with cardiac enlargement, BNP 618. LFTs ok, CO2 19. CT abd/pelvis pleural effusion/pulmonary edema -- echo pending, avoid BB  therapy until echo results -- check lactic acid   Elevated troponin -- hsTn 110>>232, denies any chest pain. Suspect demand ischemia from severely elevated blood pressures and CHF -- echo pending  Tachycardia -- pending CT angio for PE rule out -- suspect this is more so related to her HF  Hypokalemia -- K+ 2.8 -- supplement   Anemia -- Hgb 8.7 on admission, low MCV/MCH -- suggest iron studies   Possible OSA -- reports snoring, will need outpatient sleep study  Risk Assessment/Risk Scores:   New York Heart Association (NYHA) Functional Class NYHA Class II     For questions or updates, please contact [facility] Please consult [email or web address] for contact info under   Signed, Laverda Page, NP  06/29/2022 1:40 PM  Addendum:  Echo report shows LVEF of 20-25%, moderately reduced RV, moderately dilated left atrium, mildly dilated right atrium, severe MR, small pericardial effusion.  -- place PICC line, check Coox -- IV lasix 40mg  BID -- discussed with MD, Will consult AHF

## 2022-06-29 NOTE — Assessment & Plan Note (Addendum)
Hgb 9.4 yesterday. MCV 78.0 and points toward microcytic anemia. Iron studies demonstrate iron deficiency. Cardiology recommends waiting to transfuse iron until after improvement of HFrEF.  - F/u iron studies  - AM CBC

## 2022-06-30 ENCOUNTER — Observation Stay (HOSPITAL_COMMUNITY): Payer: Medicaid Other

## 2022-06-30 ENCOUNTER — Other Ambulatory Visit (HOSPITAL_COMMUNITY): Payer: Self-pay

## 2022-06-30 DIAGNOSIS — I160 Hypertensive urgency: Secondary | ICD-10-CM

## 2022-06-30 DIAGNOSIS — R7989 Other specified abnormal findings of blood chemistry: Secondary | ICD-10-CM

## 2022-06-30 DIAGNOSIS — E876 Hypokalemia: Secondary | ICD-10-CM

## 2022-06-30 DIAGNOSIS — I5021 Acute systolic (congestive) heart failure: Secondary | ICD-10-CM | POA: Diagnosis present

## 2022-06-30 DIAGNOSIS — I110 Hypertensive heart disease with heart failure: Secondary | ICD-10-CM

## 2022-06-30 DIAGNOSIS — I43 Cardiomyopathy in diseases classified elsewhere: Secondary | ICD-10-CM

## 2022-06-30 LAB — COMPREHENSIVE METABOLIC PANEL
ALT: 20 U/L (ref 0–44)
AST: 23 U/L (ref 15–41)
Albumin: 3 g/dL — ABNORMAL LOW (ref 3.5–5.0)
Alkaline Phosphatase: 55 U/L (ref 38–126)
Anion gap: 10 (ref 5–15)
BUN: 10 mg/dL (ref 6–20)
CO2: 24 mmol/L (ref 22–32)
Calcium: 8 mg/dL — ABNORMAL LOW (ref 8.9–10.3)
Chloride: 102 mmol/L (ref 98–111)
Creatinine, Ser: 1.21 mg/dL — ABNORMAL HIGH (ref 0.44–1.00)
GFR, Estimated: >60 mL/min (ref >60)
Glucose, Bld: 95 mg/dL (ref 70–99)
Potassium: 2.7 mmol/L — CL (ref 3.5–5.1)
Sodium: 136 mmol/L (ref 135–145)
Total Bilirubin: 2.6 mg/dL — ABNORMAL HIGH (ref 0.3–1.2)
Total Protein: 6.3 g/dL — ABNORMAL LOW (ref 6.5–8.1)

## 2022-06-30 LAB — CBC
HCT: 24.8 % — ABNORMAL LOW (ref 36.0–46.0)
Hemoglobin: 8 g/dL — ABNORMAL LOW (ref 12.0–15.0)
MCH: 25.1 pg — ABNORMAL LOW (ref 26.0–34.0)
MCHC: 32.3 g/dL (ref 30.0–36.0)
MCV: 77.7 fL — ABNORMAL LOW (ref 80.0–100.0)
Platelets: 349 10*3/uL (ref 150–400)
RBC: 3.19 MIL/uL — ABNORMAL LOW (ref 3.87–5.11)
RDW: 17 % — ABNORMAL HIGH (ref 11.5–15.5)
WBC: 9.5 10*3/uL (ref 4.0–10.5)
nRBC: 0 % (ref 0.0–0.2)

## 2022-06-30 LAB — BASIC METABOLIC PANEL
Anion gap: 12 (ref 5–15)
BUN: 13 mg/dL (ref 6–20)
CO2: 23 mmol/L (ref 22–32)
Calcium: 8.8 mg/dL — ABNORMAL LOW (ref 8.9–10.3)
Chloride: 100 mmol/L (ref 98–111)
Creatinine, Ser: 1.51 mg/dL — ABNORMAL HIGH (ref 0.44–1.00)
GFR, Estimated: 47 mL/min — ABNORMAL LOW (ref >60)
Glucose, Bld: 84 mg/dL (ref 70–99)
Potassium: 3.2 mmol/L — ABNORMAL LOW (ref 3.5–5.1)
Sodium: 135 mmol/L (ref 135–145)

## 2022-06-30 LAB — IRON AND TIBC
Iron: 18 ug/dL — ABNORMAL LOW (ref 28–170)
Saturation Ratios: 5 % — ABNORMAL LOW (ref 10.4–31.8)
TIBC: 370 ug/dL (ref 250–450)
UIBC: 352 ug/dL

## 2022-06-30 LAB — COOXEMETRY PANEL
Carboxyhemoglobin: 2.2 % — ABNORMAL HIGH (ref 0.5–1.5)
Methemoglobin: <0.7 % (ref 0.0–1.5)
O2 Saturation: 79.2 %
Total hemoglobin: 8.3 g/dL — ABNORMAL LOW (ref 12.0–16.0)

## 2022-06-30 LAB — MAGNESIUM
Magnesium: 1.7 mg/dL (ref 1.7–2.4)
Magnesium: 2.4 mg/dL (ref 1.7–2.4)

## 2022-06-30 LAB — FERRITIN: Ferritin: 20 ng/mL (ref 11–307)

## 2022-06-30 MED ORDER — POTASSIUM CHLORIDE CRYS ER 20 MEQ PO TBCR
40.0000 meq | EXTENDED_RELEASE_TABLET | Freq: Once | ORAL | Status: AC
  Administered 2022-06-30: 40 meq via ORAL
  Filled 2022-06-30: qty 2

## 2022-06-30 MED ORDER — SACUBITRIL-VALSARTAN 97-103 MG PO TABS
1.0000 | ORAL_TABLET | Freq: Two times a day (BID) | ORAL | Status: DC
  Administered 2022-06-30 – 2022-07-02 (×4): 1 via ORAL
  Filled 2022-06-30 (×4): qty 1

## 2022-06-30 MED ORDER — SPIRONOLACTONE 25 MG PO TABS
25.0000 mg | ORAL_TABLET | Freq: Every day | ORAL | Status: DC
  Administered 2022-07-01 – 2022-07-04 (×4): 25 mg via ORAL
  Filled 2022-06-30 (×4): qty 1

## 2022-06-30 MED ORDER — SPIRONOLACTONE 12.5 MG HALF TABLET
12.5000 mg | ORAL_TABLET | Freq: Once | ORAL | Status: AC
  Administered 2022-06-30: 12.5 mg via ORAL
  Filled 2022-06-30: qty 1

## 2022-06-30 MED ORDER — POTASSIUM CHLORIDE 10 MEQ/100ML IV SOLN
10.0000 meq | INTRAVENOUS | Status: AC
  Administered 2022-06-30 (×4): 10 meq via INTRAVENOUS
  Filled 2022-06-30 (×4): qty 100

## 2022-06-30 MED ORDER — MAGNESIUM SULFATE 2 GM/50ML IV SOLN
2.0000 g | Freq: Once | INTRAVENOUS | Status: AC
  Administered 2022-06-30: 2 g via INTRAVENOUS
  Filled 2022-06-30: qty 50

## 2022-06-30 MED ORDER — POTASSIUM CHLORIDE CRYS ER 20 MEQ PO TBCR: 40.0000 meq | EXTENDED_RELEASE_TABLET | Freq: Once | ORAL | Status: DC

## 2022-06-30 NOTE — Progress Notes (Addendum)
Daily Progress Note Intern Pager: [phone]  Patient name: Ann Robbins Medical record number: [id number] Date of birth: 09-17-1990 Age: 32 y.o. Gender: female  Primary Care Provider: Charlott Rakes, MD Consultants: Cardiology  Code Status: Full   Pt Overview and Major Events to Date:  2/8 - Admitted   Assessment and Plan: Ann Robbins is a 32 y.o. female with PMH of HTN who presented with abdominal pain. Found to have very new onset HFrEF, elevated troponin.  Blood pressure now stable and receiving diuresis.    * Acute HFrEF (heart failure with reduced ejection fraction) (HCC) ECHO shows patient with EF of 20-25%. She has pleural effusions, pulmonary edema and pericardial effusion. Weight is down ~ 10 lbs since yesterday, net decrease od 2 L. and Cr has stayed stable. Most likely developed HF due to uncontrolled HTN.  - Cardiology consulted, appreciate their recommendations - Await initiating Bblocker until completion of hyperaldosteronism labs  - Continue 40 mg IV lasix BID  - Start 25 mg spironolactone daily  - Cardiac telemetry - Strict I's and O's - Daily weights  AKI (acute kidney injury) ([place]) Creatinine 1.21 today, baseline 0.9 2 years ago.  Likely prerenal in setting of acute congestive heart failure.  Caution with fluids. Will continue diureses. - Caution with nephrotoxic agents - Continue 40 mg IV lasix BID - BMP   Anemia Hgb 8.0 this morning. MCV points toward microcytic anemia due to iron deficiency. Cardiology recommends waiting to transfuse iron until after improvement of HFrEF.  - F/u iron studies  - CBC AM   Hypokalemia K 2.7 this AM. Could be due to hyperaldosteronism.  - F/u labs  - Replete with 4 runs IV potassium 10 meq, 40 meq Potassium x 2  - 6 pm BMP, plan for one more dose of 40 meq potassium this evening.   Tachycardia Patient continues to be tachycardic to 110s today. Still afebrile without evidence of infection on exam. CT angio  negative for PE. No arrhythmias on monitor. Most likely due to being intravascularly depleted and trying to compensate for low EF.  - Continue to monitor   Hypertension Blood pressure now improved, while on nitroglycerin gtt. Patient had not been taking medications due to running out months ago and inability to afford. Renal artery ultrasound negative for stenosis. HTN etiology still unclear could be due to hyperaldosteronism as less common in this age. Could also have component of sleep apnea that will need to be evaluated outpatient.  - F/u 5 HIAA urine, aldosterone plus renin activity ratio, urine catecholamines, urine cortisol, urine metanephrines. Was on amlodipine 10 mg and HCTZ 25 mg at home.  - Avoid beta-blockers until urine studies obtained - Start weaning IV Nitro - Continue hydralazine 25 mg TID - Continue entresto 49-51  - Continue spironolactone 25 mg   Elevated troponin HS TP 110 >>232.  No chest pain, but endorses nausea and abdominal pain.  EKG on presentation, did not show STEMI.  Suspect demand ischemia from acute CHF.  Will obtain repeat EKG. - N.p.o., sips with meds - Trend troponin    FEN/GI: Heart healthy  PPx: lovenox  Dispo:Home pending clinical improvement.   Subjective:  Patient says she feels fine. Denies shortness of breath, chest pain, or palpitations. Says that her abdominal pain has resolved. Says she has not taken her HTN medications for months due to running out and not being able to afford more. Denies ever having side effects.     Objective: Temp:  [  98.1 F (36.7 C)-98.6 F (37 C)] 98.4 F (36.9 C) (02/09 0600) Pulse Rate:  [99-123] 107 (02/09 0600) Resp:  [18-33] 18 (02/09 1215) BP: (135-186)/(75-140) 148/83 (02/09 1215) SpO2:  [91 %-98 %] 91 % (02/09 0600) Weight:  [89 kg-93.7 kg] 89 kg (02/09 0600) Physical Exam: General: Well appearing, sitting in bed  Cardiovascular: Tachycardic, Regular rhythm, fluid auscultated around heart, cap refill  < 2 seconds, No JVD  Respiratory: Normal work of breathing on room air, inspiratory crackles at bilateral lung bases,  Abdomen: Soft, non distended, non tender Extremities: No BLE edema palpable, warm, dry   Laboratory: Most recent CBC Lab Results  Component Value Date   WBC 9.5 06/30/2022   HGB 8.0 (L) 06/30/2022   HCT 24.8 (L) 06/30/2022   MCV 77.7 (L) 06/30/2022   PLT 349 06/30/2022   Most recent BMP    Latest Ref Rng & Units 06/30/2022    4:50 AM  BMP  Glucose 70 - 99 mg/dL 95   BUN 6 - 20 mg/dL 10   Creatinine 0.44 - 1.00 mg/dL 1.21   Sodium 135 - 145 mmol/L 136   Potassium 3.5 - 5.1 mmol/L 2.7   Chloride 98 - 111 mmol/L 102   CO2 22 - 32 mmol/L 24   Calcium 8.9 - 10.3 mg/dL 8.0     Ann Ram, MD 06/30/2022, 2:00 PM  PGY-1, [place] Intern pager: [phone], text pages welcome Secure chat group [place]

## 2022-06-30 NOTE — Progress Notes (Addendum)
Progress Note  Patient Name: Ann Robbins Date of Encounter: 06/30/2022  [facility] Cardiologist: None   Subjective   Feeling better today.  BP much improved.  Good diuresis on Lasix.  Co-ox good at 79.  2D echo with EF 20-25% global HK  Inpatient Medications    Scheduled Meds:  Chlorhexidine Gluconate Cloth  6 each Topical Daily   enoxaparin (LOVENOX) injection  40 mg Subcutaneous Q24H   furosemide  40 mg Intravenous BID   hydrALAZINE  25 mg Oral Q8H   potassium chloride  40 mEq Oral BID   sacubitril-valsartan  1 tablet Oral BID   sodium chloride flush  10-40 mL Intracatheter Q12H   spironolactone  12.5 mg Oral Daily   Continuous Infusions:  nitroGLYCERIN 40 mcg/min (06/29/22 1955)   potassium chloride 10 mEq (06/30/22 0757)   PRN Meds: acetaminophen, sodium chloride flush   Vital Signs    Vitals:   06/30/22 0008 06/30/22 0023 06/30/22 0600 06/30/22 0747  BP: 138/75 135/78 (!) 144/81 (!) 141/76  Pulse: 99  (!) 107   Resp: 18  18   Temp: 98.5 F (36.9 C)  98.4 F (36.9 C)   TempSrc: Oral  Oral   SpO2: 93%  91%   Weight:   89 kg   Height:        Intake/Output Summary (Last 24 hours) at 06/30/2022 0925 Last data filed at 06/30/2022 [id number] Gross per 24 hour  Intake 1819.45 ml  Output 4750 ml  Net -2930.55 ml      06/30/2022    6:00 AM 06/29/2022    3:44 PM 06/29/2022    8:22 AM  Last 3 Weights  Weight (lbs) 196 lb 4.8 oz 206 lb 9.1 oz 196 lb  Weight (kg) 89.041 kg 93.7 kg 88.905 kg      Telemetry    NSR/sinus tachy - Personally Reviewed  ECG    No new EKG to review - Personally Reviewed  Physical Exam   GEN: No acute distress.   Neck: No JVD Cardiac: regular but tachy, no murmurs, rubs, or gallops.  Respiratory: Clear to auscultation bilaterally. GI: Soft, nontender, non-distended  MS: No edema; No deformity. Neuro:  Nonfocal  Psych: Normal affect   Labs    High Sensitivity Troponin:   Recent Labs  Lab 06/29/22 0759 06/29/22 1249  06/29/22 1603 06/29/22 1730  TROPONINIHS 110* 232* 207* 236*      Chemistry Recent Labs  Lab 06/29/22 0759 06/30/22 0450  NA 136 136  K 2.8* 2.7*  CL 105 102  CO2 19* 24  GLUCOSE 116* 95  BUN 12 10  CREATININE 1.24* 1.21*  CALCIUM 8.6* 8.0*  PROT 6.7 6.3*  ALBUMIN 3.5 3.0*  AST 27 23  ALT 20 20  ALKPHOS 62 55  BILITOT 2.5* 2.6*  GFRNONAA 59* >60  ANIONGAP 12 10     Hematology Recent Labs  Lab 06/29/22 0759 06/30/22 0450  WBC 8.8 9.5  RBC 3.49* 3.19*  HGB 8.7* 8.0*  HCT 27.5* 24.8*  MCV 78.8* 77.7*  MCH 24.9* 25.1*  MCHC 31.6 32.3  RDW 17.1* 17.0*  PLT 444* 349    BNP Recent Labs  Lab 06/29/22 0759  BNP 618.6*     DDimer No results for input(s): "DDIMER" in the last 168 hours.  Radiology    CT Angio Chest Pulmonary Embolism (PE) W or WO Contrast  Result Date: 06/29/2022 CLINICAL DATA:  Shortness of breath. EXAM: CT ANGIOGRAPHY CHEST WITH CONTRAST TECHNIQUE:  Multidetector CT imaging of the chest was performed using the standard protocol during bolus administration of intravenous contrast. Multiplanar CT image reconstructions and MIPs were obtained to evaluate the vascular anatomy. RADIATION DOSE REDUCTION: This exam was performed according to the departmental dose-optimization program which includes automated exposure control, adjustment of the mA and/or kV according to patient size and/or use of iterative reconstruction technique. CONTRAST:  26m OMNIPAQUE IOHEXOL 350 MG/ML SOLN COMPARISON:  Chest radiograph dated 06/29/2022. FINDINGS: Cardiovascular: There is mild cardiomegaly. Pericardial effusion measuring up to 17 mm in thickness. There is retrograde flow of contrast from the right atrium into the IVC suggestive of a degree of right heart dysfunction. Correlation with echocardiogram recommended. The thoracic aorta is unremarkable for the degree of opacification. Evaluation of the pulmonary arteries is limited due to respiratory motion. No pulmonary artery  embolus identified. Mediastinum/Nodes: No hilar or mediastinal adenopathy. The esophagus is grossly unremarkable. No mediastinal fluid collection. Lungs/Pleura: Small bilateral pleural effusions with partial compressive atelectasis of the lower lobes. Scattered clusters of ground-glass density predominantly in the right lower lobe may represent edema but concerning for infiltrate. Clinical correlation is recommended. There is no pneumothorax. The central airways are patent. Upper Abdomen: No acute abnormality. Musculoskeletal: No chest wall abnormality. No acute or significant osseous findings. Review of the MIP images confirms the above findings. IMPRESSION: 1. No CT evidence of pulmonary artery embolus. 2. Cardiomegaly with pericardial effusion and small bilateral pleural effusions. 3. Scattered clusters of ground-glass density predominantly in the right lower lobe may represent edema but concerning for infiltrate. Electronically Signed   By: AAnner CreteM.D.   On: 06/29/2022 18:55   [place]KG SITE RITE  Result Date: 06/29/2022 If Site Rite image not attached, placement could not be confirmed due to current cardiac rhythm.  ECHOCARDIOGRAM COMPLETE  Result Date: 06/29/2022    ECHOCARDIOGRAM REPORT   Patient Name:   Ann Robbins Date of Exam: 06/29/2022 Medical Rec #:  [id number]     Height:       67.0 in Accession #:    [id number]    Weight:       196.0 lb Date of Birth:  [birth year]      BSA:          2.005 m Patient Age:    89years       BP:           193/131 mmHg Patient Gender: F              HR:           119 bpm. Exam Location:  Inpatient Procedure: 2D Echo, Cardiac Doppler, Color Doppler and Intracardiac            Opacification Agent STAT ECHO Indications:    CHF-Acute Systolic [id number] History:        Patient has no prior history of Echocardiogram examinations.                 Risk Factors:Hypertension.  Sonographer:    TDarlina SicilianRDCS Referring Phys: [phone] Witt Hospital & Nursing HomeB ROBERTS  Sonographer  Comments: Subcostal imaging limited due to gastric pain. IMPRESSIONS  1. Left ventricular ejection fraction, by estimation, is 20 to 25%. The left ventricle has severely decreased function. The left ventricle demonstrates global hypokinesis. The left ventricular internal cavity size was moderately dilated. Left ventricular diastolic parameters are indeterminate. Elevated left atrial pressure.  2. Right ventricular systolic function is moderately reduced. The right ventricular  size is normal. There is mildly elevated pulmonary artery systolic pressure.  3. Left atrial size was moderately dilated.  4. Right atrial size was mildly dilated.  5. A small pericardial effusion is present. The pericardial effusion is localized near the right atrium, anterior to the right ventricle and surrounding the apex.  6. The mitral valve is grossly normal. Severe mitral valve regurgitation. No evidence of mitral stenosis.  7. The aortic valve is tricuspid. Aortic valve regurgitation is not visualized. No aortic stenosis is present.  8. The inferior vena cava is dilated in size with <50% respiratory variability, suggesting right atrial pressure of 15 mmHg. Comparison(s): No prior Echocardiogram. FINDINGS  Left Ventricle: Left ventricular ejection fraction, by estimation, is 20 to 25%. The left ventricle has severely decreased function. The left ventricle demonstrates global hypokinesis. The left ventricular internal cavity size was moderately dilated. There is no left ventricular hypertrophy. Left ventricular diastolic parameters are indeterminate. Elevated left atrial pressure. Right Ventricle: The right ventricular size is normal. Right ventricular systolic function is moderately reduced. There is mildly elevated pulmonary artery systolic pressure. The tricuspid regurgitant velocity is 2.71 m/s, and with an assumed right atrial pressure of 15 mmHg, the estimated right ventricular systolic pressure is [id number] mmHg. Left Atrium: Left  atrial size was moderately dilated. Right Atrium: Right atrial size was mildly dilated. Pericardium: A small pericardial effusion is present. The pericardial effusion is localized near the right atrium, anterior to the right ventricle and surrounding the apex. Mitral Valve: The mitral valve is grossly normal. Severe mitral valve regurgitation. No evidence of mitral valve stenosis. MV peak gradient, 29.6 mmHg. The mean mitral valve gradient is 1.0 mmHg. Tricuspid Valve: The tricuspid valve is grossly normal. Tricuspid valve regurgitation is mild . No evidence of tricuspid stenosis. Aortic Valve: The aortic valve is tricuspid. Aortic valve regurgitation is not visualized. No aortic stenosis is present. Pulmonic Valve: The pulmonic valve was grossly normal. Pulmonic valve regurgitation is trivial. No evidence of pulmonic stenosis. Aorta: The aortic root and ascending aorta are structurally normal, with no evidence of dilitation. Venous: The inferior vena cava is dilated in size with less than 50% respiratory variability, suggesting right atrial pressure of 15 mmHg. IAS/Shunts: There is right bowing of the interatrial septum, suggestive of elevated left atrial pressure. The interatrial septum was not well visualized.  LEFT VENTRICLE PLAX 2D LVIDd:         6.20 cm      Diastology LVIDs:         5.10 cm      LV e' medial:    7.77 cm/s LV PW:         1.20 cm      LV E/e' medial:  15.3 LV IVS:        1.00 cm      LV e' lateral:   9.48 cm/s LVOT diam:     2.00 cm      LV E/e' lateral: 12.6 LV SV:         30 LV SV Index:   15 LVOT Area:     3.14 cm  LV Volumes (MOD) LV vol d, MOD A2C: 199.0 ml LV vol d, MOD A4C: 173.0 ml LV vol s, MOD A2C: 153.0 ml LV vol s, MOD A4C: 147.0 ml LV SV MOD A2C:     46.0 ml LV SV MOD A4C:     173.0 ml LV SV MOD BP:      37.9 ml RIGHT VENTRICLE RV  S prime:     10.30 cm/s TAPSE (M-mode): 1.2 cm LEFT ATRIUM              Index        RIGHT ATRIUM           Index LA diam:        4.70 cm  2.34 cm/m    RA Area:     21.10 cm LA Vol (A2C):   75.6 ml  37.71 ml/m  RA Volume:   67.60 ml  33.72 ml/m LA Vol (A4C):   106.0 ml 52.87 ml/m LA Biplane Vol: 90.2 ml  44.99 ml/m  AORTIC VALVE LVOT Vmax:   83.50 cm/s LVOT Vmean:  48.800 cm/s LVOT VTI:    0.096 m  AORTA Ao Root diam: 3.10 cm Ao Asc diam:  3.10 cm MITRAL VALVE                  TRICUSPID VALVE MV Area (PHT): 6.09 cm       TR Peak grad:   29.4 mmHg MV Area VTI:   5.93 cm       TR Vmax:        271.00 cm/s MV Peak grad:  29.6 mmHg MV Mean grad:  1.0 mmHg       SHUNTS MV Vmax:       2.72 m/s       Systemic VTI:  0.10 m MV Vmean:      32.0 cm/s      Systemic Diam: 2.00 cm MV Decel Time: 125 msec MR Peak grad:    150.8 mmHg MR Mean grad:    96.0 mmHg MR Vmax:         614.00 cm/s MR Vmean:        458.0 cm/s MR PISA:         1.57 cm MR PISA Eff ROA: 9 mm MR PISA Radius:  0.50 cm MV E velocity: 119.00 cm/s Kirk Ruths MD Electronically signed by Kirk Ruths MD Signature Date/Time: 06/29/2022/2:35:16 PM    Final    DG Chest Port 1 View  Result Date: 06/29/2022 CLINICAL DATA:  Shortness of breath. EXAM: PORTABLE CHEST 1 VIEW COMPARISON:  Lung bases from abdominal CT earlier today. FINDINGS: Enlarged cardiopericardial silhouette, pericardial effusion on CT earlier today. Small bilateral pleural effusions. Vascular congestion with question of subtle septal thickening, suspicious for pulmonary edema. No pneumothorax. No acute osseous findings. IMPRESSION: 1. Enlarged cardiopericardial silhouette, pericardial effusion on CT earlier today. 2. Small bilateral pleural effusions. Vascular congestion with question of subtle septal thickening, suspicious for pulmonary edema. Electronically Signed   By: Keith Rake M.D.   On: 06/29/2022 12:48   US Abdomen Limited RUQ (LIVER/GB)  Result Date: 06/29/2022 CLINICAL DATA:  Right upper quadrant abdominal pain. EXAM: ULTRASOUND ABDOMEN LIMITED RIGHT UPPER QUADRANT COMPARISON:  CT scan of same day. FINDINGS: Gallbladder:  Status post cholecystectomy. Common bile duct: Diameter: 4 mm which is within normal limits. Liver: No focal lesion identified. Within normal limits in parenchymal echogenicity. Portal vein is patent on color Doppler imaging with normal direction of blood flow towards the liver. Other: None. IMPRESSION: Status post cholecystectomy. No acute abnormality seen in the right upper quadrant of the abdomen. Electronically Signed   By: Marijo Conception M.D.   On: 06/29/2022 11:50   CT ABDOMEN PELVIS W CONTRAST  Result Date: 06/29/2022 CLINICAL DATA:  Acute abdominal pain. EXAM: CT ABDOMEN AND PELVIS WITH CONTRAST TECHNIQUE: Multidetector CT imaging  of the abdomen and pelvis was performed using the standard protocol following bolus administration of intravenous contrast. RADIATION DOSE REDUCTION: This exam was performed according to the departmental dose-optimization program which includes automated exposure control, adjustment of the mA and/or kV according to patient size and/or use of iterative reconstruction technique. CONTRAST:  18m OMNIPAQUE IOHEXOL 350 MG/ML SOLN COMPARISON:  None Available. FINDINGS: Lower chest: Bibasilar ground-glass nodules in pleural effusions. Small pericardial effusion. Hepatobiliary: No focal hepatic lesion. Postcholecystectomy. No biliary duct dilatation. Common bile duct is normal. Pancreas: Pancreas is normal. No ductal dilatation. No pancreatic inflammation. Spleen: Normal spleen Adrenals/urinary tract: Adrenal glands and kidneys are normal. The ureters and bladder normal. Stomach/Bowel: Stomach, small bowel, appendix, and cecum are normal. The colon and rectosigmoid colon are normal. Vascular/Lymphatic: Abdominal aorta is normal caliber. No periportal or retroperitoneal adenopathy. No pelvic adenopathy. 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 Ovaries normal. Other: No free fluid. Musculoskeletal: No aggressive osseous lesion.  IMPRESSION: 1. Bibasilar ground-glass nodules, pleural effusion and pericardial effusion. Findings concerning for pulmonary edema of unclear etiology. Recommend CT of the thorax. 2. No acute findings abdomen pelvis. Electronically Signed   By: SSuzy BouchardM.D.   On: 06/29/2022 10:37    Cardiac Studies   2D echo 06/29/22 IMPRESSIONS     1. Left ventricular ejection fraction, by estimation, is 20 to 25%. The  left ventricle has severely decreased function. The left ventricle  demonstrates global hypokinesis. The left ventricular internal cavity size  was moderately dilated. Left  ventricular diastolic parameters are indeterminate. Elevated left atrial  pressure.   2. Right ventricular systolic function is moderately reduced. The right  ventricular size is normal. There is mildly elevated pulmonary artery  systolic pressure.   3. Left atrial size was moderately dilated.   4. Right atrial size was mildly dilated.   5. A small pericardial effusion is present. The pericardial effusion is  localized near the right atrium, anterior to the right ventricle and  surrounding the apex.   6. The mitral valve is grossly normal. Severe mitral valve regurgitation.  No evidence of mitral stenosis.   7. The aortic valve is tricuspid. Aortic valve regurgitation is not  visualized. No aortic stenosis is present.   8. The inferior vena cava is dilated in size with <50% respiratory  variability, suggesting right atrial pressure of 15 mmHg.   Comparison(s): No prior Echocardiogram.   Patient Profile     32y.o. female  with a hx of hypertension who is being seen 06/29/2022 for the evaluation of CHF at the request of Dr. MFrancia Greaves   Assessment & Plan    Hypertensive urgency -- Reports she was initially diagnosed with hypertension after being pregnant with her first child.  Has been on medications in the past but ran out and has not been on anything for quite some time (Previously on amlodipine and  HCTZ).  -- Systolic blood pressures in the 200s on arrival and tachycardic in the 120's -- in review of recent PCP OV notes, BP has been very high while on meds -- initially started on IV NTG gtt and Hydralazine 22mTID until PRA/aldo/Renin drawn and now on Entresto 49-5165mID, spiro 41m51mily and Lasix 40mg44mBID -- Preliminary read on renal artery duplex normal -- TSH normal -- BP improved to 141/76mmH6mis am -- continue Entresto 49-51mg B82mHydralazine 41mg TI54m slowly wean off IV NTG as titrating up GDMT therapy -- renal  function stable with SCr 1.21 -- increase spiro to 50m daily help with hypokalemia -- BB has not been added yet while collecting 24hr UA for catechols and due to acute CHF >> will add on prior to d/c -- needs sleep study outpt   Acute systolic CHF/DCM (NEW) -- etiology unknown, suspect hypertensive cardiomyopathy -- She is warm and dry on exam. Tachycardic, but BP improved -- Received 1.5L NS while in the ED before starting diuretics -- CXR with cardiac enlargement, BNP 618. LFTs ok, CO2 19. CT abd/pelvis pleural effusion/pulmonary edema --  lactic acid 1.3 -- 2D echo with severe LV dysfunction EF 20-25% with global HK and moderate RV dysfunction.  RAP estimated at 132mg -- co-ox good at 79 so no indication for inotropic therapy at this time -- started on Lasix 4031mV BID yesterday and put out 4.75L yesterday and is net neg 2.4L since admit -- SCr stable at 1.21 and K+ remains low at 2.7 and Mag 1.7 -- replete K+ to keep >4 and Mag >2 -- BMET in am -- continue Lasix 62m31m BID, Entresto 49-51mg39m, Hydralazine 25mg 30m-- increase spiro to 25mg d59m which will help with low K+ -- BB has not been added yet while collecting 24hr UA for catechols and acute CHF>> will add Carvedilol 3.125mg BI5mior to d/c -- would benefit from addition of SGLT2i at discharge  Elevated troponin -- hsTn 110>>232>>207>>236 flat trend, denies any chest pain.  -- Suspect  demand ischemia from severely elevated blood pressures and CHF and unlikely to represent CAD given her young age -- 2D echo with severe LV dysfunction with global HK but likely related to HTN   Tachycardia -- CT angio for  negative for PE  -- suspect this is more so related to her HF   Hypokalemia -- K+ 2.8 on admission ? Etiology possibly related to low Mag or hyperaldo state given severe HTN (was not on diuretics at home) -- remains with low K+ but had brisk diuresis so likely playing a rold -- Renin/aldo/PRA results pending -- supplement K+ and Mag -- increasing Spiro   Arlyce Harmana -- Hgb 8.7 on admission, low MCV/MCH -- suggest iron studies    Possible OSA -- reports snoring, will need outpatient sleep study    I have spent a total of 40 minutes with patient reviewing 2D echo, chest CTA , telemetry, EKGs, labs and examining patient as well as establishing an assessment and plan that was discussed with the patient.  > 50% of time was spent in direct patient care.    For questions or updates, please contact [facility] [email or web address] for contact info under        Signed, Trista Ciocca TuFransico Him9/2024, 9:25 AM

## 2022-06-30 NOTE — Plan of Care (Signed)
  Problem: Nutrition: Goal: Adequate nutrition will be maintained Outcome: Completed/Met   Problem: Elimination: Goal: Will not experience complications related to bowel motility Outcome: Completed/Met Goal: Will not experience complications related to urinary retention Outcome: Completed/Met   Problem: Pain Managment: Goal: General experience of comfort will improve Outcome: Completed/Met   Problem: Skin Integrity: Goal: Risk for impaired skin integrity will decrease Outcome: Completed/Met   

## 2022-06-30 NOTE — TOC Initial Note (Addendum)
Transition of Care [facility]) - Initial/Assessment Note    Patient Details  Name: Ann Robbins MRN: [id number] Date of Birth: Dec 13, 1990  Transition of Care [facility] CM/SW Contact:    Erenest Rasher, RN Phone Number: [phone] 06/30/2022, 10:52 AM  Clinical Narrative:                  HF TOC CM spoke to pt and states she works full-time as Pharmacist, hospital and part-time at [employer]. States she will need a note for work. Provided pt with a scale to use at home for daily weights. Educate pt on importance of daily weight. Pt states she has Advice worker. Drives to her appts.  Referral sent to Financial Counselor to verify and screen. Will need assistance with medication from HF fund/MATCH.   PCP:  Charlott Rakes, MD   Expected Discharge Plan: Home/Self Care Barriers to Discharge: Continued Medical Work up   Patient Goals and CMS Choice Patient states their goals for this hospitalization and ongoing recovery are:: wants to get better to care for her small children in the home          Expected Discharge Plan and Services   Discharge Planning Services: CM Consult   Living arrangements for the past 2 months: Single Family Home                                      Prior Living Arrangements/Services Living arrangements for the past 2 months: Single Family Home Lives with:: Significant Other, Minor Children Patient language and need for interpreter reviewed:: Yes Do you feel safe going back to the place where you live?: Yes      Need for Family Participation in Patient Care: No (Comment) Care giver support system in place?: Yes (comment)   Criminal Activity/Legal Involvement Pertinent to Current Situation/Hospitalization: No - Comment as needed  Activities of Daily Living Home Assistive Devices/Equipment: None ADL Screening (condition at time of admission) Patient's cognitive ability adequate to safely complete daily activities?: Yes Is the patient deaf or have  difficulty hearing?: No Does the patient have difficulty seeing, even when wearing glasses/contacts?: No Does the patient have difficulty concentrating, remembering, or making decisions?: No Patient able to express need for assistance with ADLs?: Yes Does the patient have difficulty dressing or bathing?: No Independently performs ADLs?: Yes (appropriate for developmental age) Does the patient have difficulty walking or climbing stairs?: No Weakness of Legs: None Weakness of Arms/Hands: None  Permission Sought/Granted Permission sought to share information with : Case Manager, Family Supports Permission granted to share information with : Yes, Verbal Permission Granted  Share Information with NAME: Petula Delvecchio     Permission granted to share info w Relationship: mother  Permission granted to share info w Contact Information: [phone]  Emotional Assessment Appearance:: Appears stated age Attitude/Demeanor/Rapport: Engaged Affect (typically observed): Pleasant, Accepting Orientation: : Oriented to Self, Oriented to Place, Oriented to  Time, Oriented to Situation   Psych Involvement: No (comment)  Admission diagnosis:  Shortness of breath [R06.02] Elevated troponin [R79.89] Right upper quadrant abdominal pain [R10.11] Patient Active Problem List   Diagnosis Date Noted   Elevated troponin 06/29/2022   Acute CHF (congestive heart failure) ([place]) 06/29/2022   Hypertensive urgency 06/29/2022   Tachycardia 06/29/2022   Hypokalemia 06/29/2022   Anemia 06/29/2022   AKI (acute kidney injury) ([place]) 06/29/2022   Obesity 07/09/2016   Dental decay  02/26/2016   Essential hypertension 02/25/2016   History of cervical dysplasia 08/29/2013   PCP:  Charlott Rakes, MD Pharmacy:   [facility] [address] [place] [address] Phone: [phone] Fax: [phone]     Social Determinants of Health  (SDOH) Social History: SDOH Screenings   Food Insecurity: No Food Insecurity (06/29/2022)  Housing: Low Risk  (06/29/2022)  Transportation Needs: No Transportation Needs (06/29/2022)  Utilities: Not At Risk (06/29/2022)  Depression (PHQ2-9): Low Risk  (12/14/2020)  Financial Resource Strain: Low Risk  (06/30/2022)  Tobacco Use: Low Risk  (06/29/2022)   SDOH Interventions: Financial Strain Interventions: Intervention Not Indicated   Readmission Risk Interventions     No data to display

## 2022-06-30 NOTE — Progress Notes (Signed)
Renal artery duplex has been completed.   Preliminary results in CV Proc.   Ann Robbins 06/30/2022 9:21 AM

## 2022-06-30 NOTE — Progress Notes (Signed)
Advanced Heart Failure Rounding Note  PCP-Cardiologist: None   Subjective:    Brisk diuresis yesterday. 4.8L in UOP. Wt trending down. CVP not working correctly   SCr 1.24>>1.27 K 2.7  Mg 1.7   BP remains elevated but improving w/ GDMT. Remains on NTG gtt. Breathing improving w/ diuresis. No CP     Objective:   Weight Range: 89 kg Body mass index is 30.74 kg/m.   Vital Signs:   Temp:  [98.1 F (36.7 C)-98.6 F (37 C)] 98.4 F (36.9 C) (02/09 0600) Pulse Rate:  [99-123] 107 (02/09 0600) Resp:  [15-48] 18 (02/09 0600) BP: (135-186)/(75-140) 141/76 (02/09 0747) SpO2:  [87 %-100 %] 91 % (02/09 0600) Weight:  [89 kg-93.7 kg] 89 kg (02/09 0600) Last BM Date : 06/29/22  Weight change: Filed Weights   06/29/22 0822 06/29/22 1544 06/30/22 0600  Weight: 88.9 kg 93.7 kg 89 kg    Intake/Output:   Intake/Output Summary (Last 24 hours) at 06/30/2022 1003 Last data filed at 06/30/2022 [id number] Gross per 24 hour  Intake 1819.45 ml  Output 4750 ml  Net -2930.55 ml      Physical Exam    General:  Well appearing. No resp difficulty HEENT: Normal Neck: Supple. JVP 8 cm . Carotids 2+ bilat; no bruits. No lymphadenopathy or thyromegaly appreciated. Cor: PMI nondisplaced. Regular rate & rhythm. No rubs, gallops or murmurs. Lungs: Clear Abdomen: Soft, nontender, nondistended. No hepatosplenomegaly. No bruits or masses. Good bowel sounds. Extremities: No cyanosis, clubbing, rash, edema Neuro: Alert & orientedx3, cranial nerves grossly intact. moves all 4 extremities w/o difficulty. Affect pleasant   Telemetry   Sinus tach low 100s   EKG    N/A   Labs    CBC Recent Labs    06/29/22 0759 06/30/22 0450  WBC 8.8 9.5  NEUTROABS 6.8  --   HGB 8.7* 8.0*  HCT 27.5* 24.8*  MCV 78.8* 77.7*  PLT 444* 0000000   Basic Metabolic Panel Recent Labs    06/29/22 0759 06/30/22 0450  NA 136 136  K 2.8* 2.7*  CL 105 102  CO2 19* 24  GLUCOSE 116* 95  BUN 12 10  CREATININE  1.24* 1.21*  CALCIUM 8.6* 8.0*  MG  --  1.7   Liver Function Tests Recent Labs    06/29/22 0759 06/30/22 0450  AST 27 23  ALT 20 20  ALKPHOS 62 55  BILITOT 2.5* 2.6*  PROT 6.7 6.3*  ALBUMIN 3.5 3.0*   Recent Labs    06/29/22 0759  LIPASE 25   Cardiac Enzymes No results for input(s): "CKTOTAL", "CKMB", "CKMBINDEX", "TROPONINI" in the last 72 hours.  BNP: BNP (last 3 results) Recent Labs    06/29/22 0759  BNP 618.6*    ProBNP (last 3 results) No results for input(s): "PROBNP" in the last 8760 hours.   D-Dimer No results for input(s): "DDIMER" in the last 72 hours. Hemoglobin A1C No results for input(s): "HGBA1C" in the last 72 hours. Fasting Lipid Panel Recent Labs    06/29/22 1603  CHOL 117  HDL 32*  LDLCALC 71  TRIG 72  CHOLHDL 3.7   Thyroid Function Tests Recent Labs    06/29/22 1601  TSH 0.741    Other results:   Imaging    VAS US RENAL ARTERY DUPLEX  Result Date: 06/30/2022 ABDOMINAL VISCERAL Patient Name:  Ann Robbins  Date of Exam:   06/30/2022 Medical Rec #: [id number]       Accession #:  [id number] Date of Birth: [birth year]        Patient Gender: F Patient Age:   32 years Exam Location:  [facility] Procedure:      VAS US RENAL ARTERY DUPLEX Referring Phys: [id number] LINDSAY B ROBERTS -------------------------------------------------------------------------------- Indications: HTN High Risk Factors: Hypertension. Comparison Study: no prior Performing Technologist: Archie Patten RVS  Examination Guidelines: A complete evaluation includes B-mode imaging, spectral Doppler, color Doppler, and power Doppler as needed of all accessible portions of each vessel. Bilateral testing is considered an integral part of a complete examination. Limited examinations for reoccurring indications may be performed as noted.  Duplex Findings: +--------------------+--------+--------+------+--------+ Mesenteric          PSV cm/sEDV cm/sPlaqueComments  +--------------------+--------+--------+------+--------+ Aorta at SMA          126                          +--------------------+--------+--------+------+--------+ Celiac Artery Origin  163                          +--------------------+--------+--------+------+--------+ SMA Origin            186                          +--------------------+--------+--------+------+--------+    +------------------+--------+--------+-------+ Right Renal ArteryPSV cm/sEDV cm/sComment +------------------+--------+--------+-------+ Origin               81      34           +------------------+--------+--------+-------+ Proximal             70      28           +------------------+--------+--------+-------+ Mid                  69      31           +------------------+--------+--------+-------+ Distal               65      30           +------------------+--------+--------+-------+ +-----------------+--------+--------+-------+ Left Renal ArteryPSV cm/sEDV cm/sComment +-----------------+--------+--------+-------+ Origin              41      16           +-----------------+--------+--------+-------+ Proximal            41      14           +-----------------+--------+--------+-------+ Mid                 44      14           +-----------------+--------+--------+-------+ Distal              42      15           +-----------------+--------+--------+-------+ +------------+--------+--------+----+-----------+--------+--------+----+ Right KidneyPSV cm/sEDV cm/sRI  Left KidneyPSV cm/sEDV cm/sRI   +------------+--------+--------+----+-----------+--------+--------+----+ Upper Pole  16      8       0.51Upper Pole 18      9       0.51 +------------+--------+--------+----+-----------+--------+--------+----+ Mid         20      8       0.62Mid        18      7  0.59 +------------+--------+--------+----+-----------+--------+--------+----+ Lower Pole   20      8       0.62Lower Pole 19      10      0.46 +------------+--------+--------+----+-----------+--------+--------+----+ Hilar       71      30      0.57Hilar      33      17      0.49 +------------+--------+--------+----+-----------+--------+--------+----+ +------------------+-----+------------------+-----+ Right Kidney           Left Kidney             +------------------+-----+------------------+-----+ RAR                    RAR                     +------------------+-----+------------------+-----+ RAR (manual)      0.64 RAR (manual)      0.35  +------------------+-----+------------------+-----+ Cortex                 Cortex                  +------------------+-----+------------------+-----+ Cortex thickness       Corex thickness         +------------------+-----+------------------+-----+ Kidney length (cm)11.50Kidney length (cm)11.10 +------------------+-----+------------------+-----+   Summary: Renal:  Right: Normal size right kidney. Normal right Resisitive Index. No        evidence of right renal artery stenosis. Left:  Normal size of left kidney. Normal left Resistive Index. No        evidence of left renal artery stenosis. Mesenteric: Normal Celiac artery and Superior Mesenteric artery findings.  *See table(s) above for measurements and observations.     Preliminary    CT Angio Chest Pulmonary Embolism (PE) W or WO Contrast  Result Date: 06/29/2022 CLINICAL DATA:  Shortness of breath. EXAM: CT ANGIOGRAPHY CHEST WITH CONTRAST TECHNIQUE: Multidetector CT imaging of the chest was performed using the standard protocol during bolus administration of intravenous contrast. Multiplanar CT image reconstructions and MIPs were obtained to evaluate the vascular anatomy. RADIATION DOSE REDUCTION: This exam was performed according to the departmental dose-optimization program which includes automated exposure control, adjustment of the mA and/or kV according to  patient size and/or use of iterative reconstruction technique. CONTRAST:  34m OMNIPAQUE IOHEXOL 350 MG/ML SOLN COMPARISON:  Chest radiograph dated 06/29/2022. FINDINGS: Cardiovascular: There is mild cardiomegaly. Pericardial effusion measuring up to 17 mm in thickness. There is retrograde flow of contrast from the right atrium into the IVC suggestive of a degree of right heart dysfunction. Correlation with echocardiogram recommended. The thoracic aorta is unremarkable for the degree of opacification. Evaluation of the pulmonary arteries is limited due to respiratory motion. No pulmonary artery embolus identified. Mediastinum/Nodes: No hilar or mediastinal adenopathy. The esophagus is grossly unremarkable. No mediastinal fluid collection. Lungs/Pleura: Small bilateral pleural effusions with partial compressive atelectasis of the lower lobes. Scattered clusters of ground-glass density predominantly in the right lower lobe may represent edema but concerning for infiltrate. Clinical correlation is recommended. There is no pneumothorax. The central airways are patent. Upper Abdomen: No acute abnormality. Musculoskeletal: No chest wall abnormality. No acute or significant osseous findings. Review of the MIP images confirms the above findings. IMPRESSION: 1. No CT evidence of pulmonary artery embolus. 2. Cardiomegaly with pericardial effusion and small bilateral pleural effusions. 3. Scattered clusters of ground-glass density predominantly in the right lower lobe may represent edema but concerning for infiltrate. Electronically Signed  By: Anner Crete M.D.   On: 06/29/2022 18:55   [place] EKG SITE RITE  Result Date: 06/29/2022 If Site Rite image not attached, placement could not be confirmed due to current cardiac rhythm.  ECHOCARDIOGRAM COMPLETE  Result Date: 06/29/2022    ECHOCARDIOGRAM REPORT   Patient Name:   Ann Robbins Date of Exam: 06/29/2022 Medical Rec #:  [id number]      Height:       67.0 in Accession #:     [id number]     Weight:       196.0 lb Date of Birth:  06-25-1990       BSA:          2.005 m Patient Age:    56 years       BP:           193/131 mmHg Patient Gender: F              HR:           119 bpm. Exam Location:  Inpatient Procedure: 2D Echo, Cardiac Doppler, Color Doppler and Intracardiac            Opacification Agent STAT ECHO Indications:    CHF-Acute Systolic [id number]  History:        Patient has no prior history of Echocardiogram examinations.                 Risk Factors:Hypertension.  Sonographer:    Darlina Sicilian RDCS Referring Phys: [phone] [facility] B ROBERTS  Sonographer Comments: Subcostal imaging limited due to gastric pain. IMPRESSIONS  1. Left ventricular ejection fraction, by estimation, is 20 to 25%. The left ventricle has severely decreased function. The left ventricle demonstrates global hypokinesis. The left ventricular internal cavity size was moderately dilated. Left ventricular diastolic parameters are indeterminate. Elevated left atrial pressure.  2. Right ventricular systolic function is moderately reduced. The right ventricular size is normal. There is mildly elevated pulmonary artery systolic pressure.  3. Left atrial size was moderately dilated.  4. Right atrial size was mildly dilated.  5. A small pericardial effusion is present. The pericardial effusion is localized near the right atrium, anterior to the right ventricle and surrounding the apex.  6. The mitral valve is grossly normal. Severe mitral valve regurgitation. No evidence of mitral stenosis.  7. The aortic valve is tricuspid. Aortic valve regurgitation is not visualized. No aortic stenosis is present.  8. The inferior vena cava is dilated in size with <50% respiratory variability, suggesting right atrial pressure of 15 mmHg. Comparison(s): No prior Echocardiogram. FINDINGS  Left Ventricle: Left ventricular ejection fraction, by estimation, is 20 to 25%. The left ventricle has severely decreased function. The left  ventricle demonstrates global hypokinesis. The left ventricular internal cavity size was moderately dilated. There is no left ventricular hypertrophy. Left ventricular diastolic parameters are indeterminate. Elevated left atrial pressure. Right Ventricle: The right ventricular size is normal. Right ventricular systolic function is moderately reduced. There is mildly elevated pulmonary artery systolic pressure. The tricuspid regurgitant velocity is 2.71 m/s, and with an assumed right atrial pressure of 15 mmHg, the estimated right ventricular systolic pressure is [id number] mmHg. Left Atrium: Left atrial size was moderately dilated. Right Atrium: Right atrial size was mildly dilated. Pericardium: A small pericardial effusion is present. The pericardial effusion is localized near the right atrium, anterior to the right ventricle and surrounding the apex. Mitral Valve: The mitral valve is grossly normal. Severe mitral valve regurgitation. No evidence of mitral  valve stenosis. MV peak gradient, 29.6 mmHg. The mean mitral valve gradient is 1.0 mmHg. Tricuspid Valve: The tricuspid valve is grossly normal. Tricuspid valve regurgitation is mild . No evidence of tricuspid stenosis. Aortic Valve: The aortic valve is tricuspid. Aortic valve regurgitation is not visualized. No aortic stenosis is present. Pulmonic Valve: The pulmonic valve was grossly normal. Pulmonic valve regurgitation is trivial. No evidence of pulmonic stenosis. Aorta: The aortic root and ascending aorta are structurally normal, with no evidence of dilitation. Venous: The inferior vena cava is dilated in size with less than 50% respiratory variability, suggesting right atrial pressure of 15 mmHg. IAS/Shunts: There is right bowing of the interatrial septum, suggestive of elevated left atrial pressure. The interatrial septum was not well visualized.  LEFT VENTRICLE PLAX 2D LVIDd:         6.20 cm      Diastology LVIDs:         5.10 cm      LV e' medial:    7.77 cm/s  LV PW:         1.20 cm      LV E/e' medial:  15.3 LV IVS:        1.00 cm      LV e' lateral:   9.48 cm/s LVOT diam:     2.00 cm      LV E/e' lateral: 12.6 LV SV:         30 LV SV Index:   15 LVOT Area:     3.14 cm  LV Volumes (MOD) LV vol d, MOD A2C: 199.0 ml LV vol d, MOD A4C: 173.0 ml LV vol s, MOD A2C: 153.0 ml LV vol s, MOD A4C: 147.0 ml LV SV MOD A2C:     46.0 ml LV SV MOD A4C:     173.0 ml LV SV MOD BP:      37.9 ml RIGHT VENTRICLE RV S prime:     10.30 cm/s TAPSE (M-mode): 1.2 cm LEFT ATRIUM              Index        RIGHT ATRIUM           Index LA diam:        4.70 cm  2.34 cm/m   RA Area:     21.10 cm LA Vol (A2C):   75.6 ml  37.71 ml/m  RA Volume:   67.60 ml  33.72 ml/m LA Vol (A4C):   106.0 ml 52.87 ml/m LA Biplane Vol: 90.2 ml  44.99 ml/m  AORTIC VALVE LVOT Vmax:   83.50 cm/s LVOT Vmean:  48.800 cm/s LVOT VTI:    0.096 m  AORTA Ao Root diam: 3.10 cm Ao Asc diam:  3.10 cm MITRAL VALVE                  TRICUSPID VALVE MV Area (PHT): 6.09 cm       TR Peak grad:   29.4 mmHg MV Area VTI:   5.93 cm       TR Vmax:        271.00 cm/s MV Peak grad:  29.6 mmHg MV Mean grad:  1.0 mmHg       SHUNTS MV Vmax:       2.72 m/s       Systemic VTI:  0.10 m MV Vmean:      32.0 cm/s      Systemic Diam: 2.00 cm MV Decel Time: 125 msec MR Peak  grad:    150.8 mmHg MR Mean grad:    96.0 mmHg MR Vmax:         614.00 cm/s MR Vmean:        458.0 cm/s MR PISA:         1.57 cm MR PISA Eff ROA: 9 mm MR PISA Radius:  0.50 cm MV E velocity: 119.00 cm/s Kirk Ruths MD Electronically signed by Kirk Ruths MD Signature Date/Time: 06/29/2022/2:35:16 PM    Final    DG Chest Port 1 View  Result Date: 06/29/2022 CLINICAL DATA:  Shortness of breath. EXAM: PORTABLE CHEST 1 VIEW COMPARISON:  Lung bases from abdominal CT earlier today. FINDINGS: Enlarged cardiopericardial silhouette, pericardial effusion on CT earlier today. Small bilateral pleural effusions. Vascular congestion with question of subtle septal thickening,  suspicious for pulmonary edema. No pneumothorax. No acute osseous findings. IMPRESSION: 1. Enlarged cardiopericardial silhouette, pericardial effusion on CT earlier today. 2. Small bilateral pleural effusions. Vascular congestion with question of subtle septal thickening, suspicious for pulmonary edema. Electronically Signed   By: Keith Rake M.D.   On: 06/29/2022 12:48   US Abdomen Limited RUQ (LIVER/GB)  Result Date: 06/29/2022 CLINICAL DATA:  Right upper quadrant abdominal pain. EXAM: ULTRASOUND ABDOMEN LIMITED RIGHT UPPER QUADRANT COMPARISON:  CT scan of same day. FINDINGS: Gallbladder: Status post cholecystectomy. Common bile duct: Diameter: 4 mm which is within normal limits. Liver: No focal lesion identified. Within normal limits in parenchymal echogenicity. Portal vein is patent on color Doppler imaging with normal direction of blood flow towards the liver. Other: None. IMPRESSION: Status post cholecystectomy. No acute abnormality seen in the right upper quadrant of the abdomen. Electronically Signed   By: Marijo Conception M.D.   On: 06/29/2022 11:50   CT ABDOMEN PELVIS W CONTRAST  Result Date: 06/29/2022 CLINICAL DATA:  Acute abdominal pain. EXAM: CT ABDOMEN AND PELVIS WITH CONTRAST TECHNIQUE: Multidetector CT imaging of the abdomen and pelvis was performed using the standard protocol following bolus administration of intravenous contrast. RADIATION DOSE REDUCTION: This exam was performed according to the departmental dose-optimization program which includes automated exposure control, adjustment of the mA and/or kV according to patient size and/or use of iterative reconstruction technique. CONTRAST:  16m OMNIPAQUE IOHEXOL 350 MG/ML SOLN COMPARISON:  None Available. FINDINGS: Lower chest: Bibasilar ground-glass nodules in pleural effusions. Small pericardial effusion. Hepatobiliary: No focal hepatic lesion. Postcholecystectomy. No biliary duct dilatation. Common bile duct is normal. Pancreas:  Pancreas is normal. No ductal dilatation. No pancreatic inflammation. Spleen: Normal spleen Adrenals/urinary tract: Adrenal glands and kidneys are normal. The ureters and bladder normal. Stomach/Bowel: Stomach, small bowel, appendix, and cecum are normal. The colon and rectosigmoid colon are normal. Vascular/Lymphatic: Abdominal aorta is normal caliber. No periportal or retroperitoneal adenopathy. No pelvic adenopathy. 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 Ovaries normal. Other: No free fluid. Musculoskeletal: No aggressive osseous lesion. IMPRESSION: 1. Bibasilar ground-glass nodules, pleural effusion and pericardial effusion. Findings concerning for pulmonary edema of unclear etiology. Recommend CT of the thorax. 2. No acute findings abdomen pelvis. Electronically Signed   By: SSuzy BouchardM.D.   On: 06/29/2022 10:37     Medications:     Scheduled Medications:  Chlorhexidine Gluconate Cloth  6 each Topical Daily   enoxaparin (LOVENOX) injection  40 mg Subcutaneous Q24H   furosemide  40 mg Intravenous BID   hydrALAZINE  25 mg Oral Q8H   potassium chloride  40 mEq Oral BID   sacubitril-valsartan  1 tablet Oral BID   sodium chloride flush  10-40 mL Intracatheter Q12H   spironolactone  12.5 mg Oral Once   [START ON 07/01/2022] spironolactone  25 mg Oral Daily    Infusions:  magnesium sulfate bolus IVPB     nitroGLYCERIN 35 mcg/min (06/30/22 0959)   potassium chloride 10 mEq (06/30/22 0956)    PRN Medications: acetaminophen, sodium chloride flush    Patient Profile   32 y/o woman with history of HTN (out of meds x 1 year) admitted acute systolic heart failure and hypertensive urgency.   Echo EF 20-25%. RV mod reduced. Severe MR  Assessment/Plan   1. Acute Systolic Heart Failure Gestational hypertension with her first child 14 years ago. Remained hypertensive with 2 subsequent pregnancies. Stopped HTN medications ~ 1  year ago. Suspect hypertensive cardiomyopathy. TSH pending. Doubt ICM. Possible familial component Mom recently diagnosed with heart failure. HS Trop 110>232 BNP 618. Co-ox 79% on admit  - Echo EF 20-25% RV moderately reduced, IVC dilated  - diuresing well w/ IV Lasix. Remains mildly fluid overloaded. Continue IV Lasix 40 mg bid  - Wean off NTG gtt. Continue oral GDMT titration  - Continue hydralazine 25 mg tid - Increase Entresto to 97-103 bid  - Continue Spiro 25 mg daily  - will consider ? blocker +/- imdur next  - RN to troubleshoot CVP - Low suspicion for CAD so likely doesn't need cath.  - plan cMRI after diuresis     2. Hypertensive Urgency  - improving, weaning off NTG gtt Started on NTG drip.  - continue GDMT titration per above  - Check renal US.  - Check aldoseterone, renin, PRA  - Needs sleep study once she gets insurance.      3. Hypokalemia/ Hypomagnesemia  - K 2.8 on admit, 2.7 today, Mg 1.9  - aggressive K and Mg supp   - continue spiro  - w/u to r/o hyperaldosteronism per above    4.  ID Anemia  - Fe 18, Ferritin 20, Tsat 5  - needs IV Fe. Would not give Feraheme until procedures completed. May need  ardiac MRI.    5. Social  - Uninsured and will need medications at the time of discharge.      Length of Stay: 0  Lyda Jester, PA-C  06/30/2022, 10:03 AM  Advanced Heart Failure Team Pager [phone] (M-F; 7a - 5p)  Please contact [facility] for night-coverage after hours (5p -7a ) and weekends on [email or web address]

## 2022-07-01 LAB — CBC
HCT: 29.7 % — ABNORMAL LOW (ref 36.0–46.0)
Hemoglobin: 9.4 g/dL — ABNORMAL LOW (ref 12.0–15.0)
MCH: 24.7 pg — ABNORMAL LOW (ref 26.0–34.0)
MCHC: 31.6 g/dL (ref 30.0–36.0)
MCV: 78 fL — ABNORMAL LOW (ref 80.0–100.0)
Platelets: 374 10*3/uL (ref 150–400)
RBC: 3.81 MIL/uL — ABNORMAL LOW (ref 3.87–5.11)
RDW: 16.8 % — ABNORMAL HIGH (ref 11.5–15.5)
WBC: 9.2 10*3/uL (ref 4.0–10.5)
nRBC: 0 % (ref 0.0–0.2)

## 2022-07-01 LAB — BASIC METABOLIC PANEL
Anion gap: 11 (ref 5–15)
Anion gap: 10 (ref 5–15)
BUN: 11 mg/dL (ref 6–20)
BUN: 12 mg/dL (ref 6–20)
CO2: 24 mmol/L (ref 22–32)
CO2: 24 mmol/L (ref 22–32)
Calcium: 8.3 mg/dL — ABNORMAL LOW (ref 8.9–10.3)
Calcium: 8.7 mg/dL — ABNORMAL LOW (ref 8.9–10.3)
Chloride: 99 mmol/L (ref 98–111)
Chloride: 101 mmol/L (ref 98–111)
Creatinine, Ser: 1.16 mg/dL — ABNORMAL HIGH (ref 0.44–1.00)
Creatinine, Ser: 1.25 mg/dL — ABNORMAL HIGH (ref 0.44–1.00)
GFR, Estimated: >60 mL/min (ref >60)
GFR, Estimated: 59 mL/min — ABNORMAL LOW (ref >60)
Glucose, Bld: 93 mg/dL (ref 70–99)
Glucose, Bld: 104 mg/dL — ABNORMAL HIGH (ref 70–99)
Potassium: 3.1 mmol/L — ABNORMAL LOW (ref 3.5–5.1)
Potassium: 3.7 mmol/L (ref 3.5–5.1)
Sodium: 134 mmol/L — ABNORMAL LOW (ref 135–145)
Sodium: 135 mmol/L (ref 135–145)

## 2022-07-01 LAB — COOXEMETRY PANEL
Carboxyhemoglobin: 1.7 % — ABNORMAL HIGH (ref 0.5–1.5)
Methemoglobin: <0.7 % (ref 0.0–1.5)
O2 Saturation: 59.8 %
Total hemoglobin: 9.8 g/dL — ABNORMAL LOW (ref 12.0–16.0)

## 2022-07-01 LAB — MAGNESIUM: Magnesium: 2.1 mg/dL (ref 1.7–2.4)

## 2022-07-01 MED ORDER — POTASSIUM CHLORIDE CRYS ER 20 MEQ PO TBCR
40.0000 meq | EXTENDED_RELEASE_TABLET | ORAL | Status: AC
  Administered 2022-07-01 (×2): 40 meq via ORAL
  Filled 2022-07-01 (×2): qty 2

## 2022-07-01 MED ORDER — METOPROLOL SUCCINATE ER 25 MG PO TB24
25.0000 mg | ORAL_TABLET | Freq: Two times a day (BID) | ORAL | Status: DC
  Administered 2022-07-01 – 2022-07-02 (×3): 25 mg via ORAL
  Filled 2022-07-01 (×3): qty 1

## 2022-07-01 MED ORDER — HYDRALAZINE HCL 50 MG PO TABS
50.0000 mg | ORAL_TABLET | Freq: Three times a day (TID) | ORAL | Status: DC
  Administered 2022-07-01 – 2022-07-02 (×3): 50 mg via ORAL
  Filled 2022-07-01 (×3): qty 1

## 2022-07-01 MED ORDER — POTASSIUM CHLORIDE CRYS ER 10 MEQ PO TBCR
20.0000 meq | EXTENDED_RELEASE_TABLET | Freq: Once | ORAL | Status: DC
  Filled 2022-07-01: qty 2

## 2022-07-01 MED ORDER — POTASSIUM CHLORIDE CRYS ER 20 MEQ PO TBCR
40.0000 meq | EXTENDED_RELEASE_TABLET | Freq: Once | ORAL | Status: AC
  Administered 2022-07-01: 40 meq via ORAL
  Filled 2022-07-01: qty 2

## 2022-07-01 NOTE — Progress Notes (Signed)
Progress Note  Patient Name: Ann Robbins Date of Encounter: 07/01/2022  [facility] Cardiologist: None    Patient Profile     32 y.o. female  with a hx of hypertension who is being seen 06/29/2022 for the evaluation of CHF at the request of Dr. Francia Greaves.  Ejection fraction 20-25% seen by heart failure.  Follow-up hypertensive cardiomyopathy. On Entresto, unable to tolerate nitrates but on hydralazine and spironolactone.  History of hypokalemia so renin and Aldo levels are pending. Iron deficiency anemia  Subjective   Feeling better, still coughing when recumbent, anxious and worried.  Inpatient Medications    Scheduled Meds:  Chlorhexidine Gluconate Cloth  6 each Topical Daily   enoxaparin (LOVENOX) injection  40 mg Subcutaneous Q24H   furosemide  40 mg Intravenous BID   hydrALAZINE  25 mg Oral Q8H   potassium chloride  20 mEq Oral Once   sacubitril-valsartan  1 tablet Oral BID   sodium chloride flush  10-40 mL Intracatheter Q12H   spironolactone  25 mg Oral Daily   Continuous Infusions:   PRN Meds: acetaminophen, sodium chloride flush   Vital Signs    Vitals:   06/30/22 2044 07/01/22 0043 07/01/22 0425 07/01/22 0835  BP: (!) 148/82 (!) 147/97 (!) 146/85 133/75  Pulse: (!) 117 (!) 117 (!) 105 (!) 115  Resp: 18 18  18  $ Temp: 97.7 F (36.5 C) 97.7 F (36.5 C) 97.9 F (36.6 C)   TempSrc: Oral Oral Oral   SpO2: 98% 99% 96%   Weight:   87 kg   Height:        Intake/Output Summary (Last 24 hours) at 07/01/2022 1348 Last data filed at 07/01/2022 1300 Gross per 24 hour  Intake 1027.5 ml  Output 1300 ml  Net -272.5 ml       07/01/2022    4:25 AM 06/30/2022    6:00 AM 06/29/2022    3:44 PM  Last 3 Weights  Weight (lbs) 191 lb 12.8 oz 196 lb 4.8 oz 206 lb 9.1 oz  Weight (kg) 87 kg 89.041 kg 93.7 kg      Telemetry    NSR/sinus tachy - Personally Reviewed  ECG    No new EKG to review - Personally Reviewed  Physical Exam   Well developed and  nourished in no acute distress HENT normal Neck supple with JVP8+ HJR  Carotids brisk and full without bruits Clear Regular rate and rhythm, no murmurs or gallops Abd-soft with active BS without hepatomegaly No Clubbing cyanosis edema Skin-warm and dry A & Oriented  Grossly normal sensory and motor function   Labs    High Sensitivity Troponin:   Recent Labs  Lab 06/29/22 0759 06/29/22 1249 06/29/22 1603 06/29/22 1730  TROPONINIHS 110* 232* 207* 236*       Chemistry Recent Labs  Lab 06/29/22 0759 06/30/22 0450 06/30/22 1841 07/01/22 0443  NA 136 136 135 134*  K 2.8* 2.7* 3.2* 3.1*  CL 105 102 100 99  CO2 19* 24 23 24  $ GLUCOSE 116* 95 84 93  BUN 12 10 13 11  $ CREATININE 1.24* 1.21* 1.51* 1.16*  CALCIUM 8.6* 8.0* 8.8* 8.3*  PROT 6.7 6.3*  --   --   ALBUMIN 3.5 3.0*  --   --   AST 27 23  --   --   ALT 20 20  --   --   ALKPHOS 62 55  --   --   BILITOT 2.5* 2.6*  --   --  GFRNONAA 59* >60 47* >60  ANIONGAP 12 10 12 11      $ Hematology Recent Labs  Lab 06/29/22 0759 06/30/22 0450 07/01/22 0443  WBC 8.8 9.5 9.2  RBC 3.49* 3.19* 3.81*  HGB 8.7* 8.0* 9.4*  HCT 27.5* 24.8* 29.7*  MCV 78.8* 77.7* 78.0*  MCH 24.9* 25.1* 24.7*  MCHC 31.6 32.3 31.6  RDW 17.1* 17.0* 16.8*  PLT 444* 349 374     BNP Recent Labs  Lab 06/29/22 0759  BNP 618.6*      DDimer No results for input(s): "DDIMER" in the last 168 hours.  Radiology    VAS US RENAL ARTERY DUPLEX  Result Date: 06/30/2022 ABDOMINAL VISCERAL Patient Name:  Ann Robbins  Date of Exam:   06/30/2022 Medical Rec #: [id number]       Accession #:    [id number] Date of Birth: 11/28/1990        Patient Gender: F Patient Age:   32 years Exam Location:  [facility] Procedure:      VAS US RENAL ARTERY DUPLEX Referring Phys: [id number] LINDSAY B ROBERTS -------------------------------------------------------------------------------- Indications: HTN High Risk Factors: Hypertension. Comparison Study: no prior  Performing Technologist: Archie Patten RVS  Examination Guidelines: A complete evaluation includes B-mode imaging, spectral Doppler, color Doppler, and power Doppler as needed of all accessible portions of each vessel. Bilateral testing is considered an integral part of a complete examination. Limited examinations for reoccurring indications may be performed as noted.  Duplex Findings: +--------------------+--------+--------+------+--------+ Mesenteric          PSV cm/sEDV cm/sPlaqueComments +--------------------+--------+--------+------+--------+ Aorta at SMA          126                          +--------------------+--------+--------+------+--------+ Celiac Artery Origin  163                          +--------------------+--------+--------+------+--------+ SMA Origin            186                          +--------------------+--------+--------+------+--------+    +------------------+--------+--------+-------+ Right Renal ArteryPSV cm/sEDV cm/sComment +------------------+--------+--------+-------+ Origin               81      34           +------------------+--------+--------+-------+ Proximal             70      28           +------------------+--------+--------+-------+ Mid                  69      31           +------------------+--------+--------+-------+ Distal               65      30           +------------------+--------+--------+-------+ +-----------------+--------+--------+-------+ Left Renal ArteryPSV cm/sEDV cm/sComment +-----------------+--------+--------+-------+ Origin              41      16           +-----------------+--------+--------+-------+ Proximal            41      14           +-----------------+--------+--------+-------+ Mid  44      14           +-----------------+--------+--------+-------+ Distal              42      15           +-----------------+--------+--------+-------+  +------------+--------+--------+----+-----------+--------+--------+----+ Right KidneyPSV cm/sEDV cm/sRI  Left KidneyPSV cm/sEDV cm/sRI   +------------+--------+--------+----+-----------+--------+--------+----+ Upper Pole  16      8       0.51Upper Pole 18      9       0.51 +------------+--------+--------+----+-----------+--------+--------+----+ Mid         20      8       0.62Mid        18      7       0.59 +------------+--------+--------+----+-----------+--------+--------+----+ Lower Pole  20      8       0.62Lower Pole 19      10      0.46 +------------+--------+--------+----+-----------+--------+--------+----+ Hilar       71      30      0.57Hilar      33      17      0.49 +------------+--------+--------+----+-----------+--------+--------+----+ +------------------+-----+------------------+-----+ Right Kidney           Left Kidney             +------------------+-----+------------------+-----+ RAR                    RAR                     +------------------+-----+------------------+-----+ RAR (manual)      0.64 RAR (manual)      0.35  +------------------+-----+------------------+-----+ Cortex                 Cortex                  +------------------+-----+------------------+-----+ Cortex thickness       Corex thickness         +------------------+-----+------------------+-----+ Kidney length (cm)11.50Kidney length (cm)11.10 +------------------+-----+------------------+-----+  Summary: Renal:  Right: Normal size right kidney. Normal right Resisitive Index. No        evidence of right renal artery stenosis. Left:  Normal size of left kidney. Normal left Resistive Index. No        evidence of left renal artery stenosis. Mesenteric: Normal Celiac artery and Superior Mesenteric artery findings.  *See table(s) above for measurements and observations.  Diagnosing physician: Harold Barban MD  Electronically signed by Harold Barban MD on  06/30/2022 at 9:28:44 PM.    Final    CT Angio Chest Pulmonary Embolism (PE) W or WO Contrast  Result Date: 06/29/2022 CLINICAL DATA:  Shortness of breath. EXAM: CT ANGIOGRAPHY CHEST WITH CONTRAST TECHNIQUE: Multidetector CT imaging of the chest was performed using the standard protocol during bolus administration of intravenous contrast. Multiplanar CT image reconstructions and MIPs were obtained to evaluate the vascular anatomy. RADIATION DOSE REDUCTION: This exam was performed according to the departmental dose-optimization program which includes automated exposure control, adjustment of the mA and/or kV according to patient size and/or use of iterative reconstruction technique. CONTRAST:  61m OMNIPAQUE IOHEXOL 350 MG/ML SOLN COMPARISON:  Chest radiograph dated 06/29/2022. FINDINGS: Cardiovascular: There is mild cardiomegaly. Pericardial effusion measuring up to 17 mm in thickness. There is retrograde flow of contrast from the right atrium into the IVC suggestive of a degree of right heart dysfunction. Correlation  with echocardiogram recommended. The thoracic aorta is unremarkable for the degree of opacification. Evaluation of the pulmonary arteries is limited due to respiratory motion. No pulmonary artery embolus identified. Mediastinum/Nodes: No hilar or mediastinal adenopathy. The esophagus is grossly unremarkable. No mediastinal fluid collection. Lungs/Pleura: Small bilateral pleural effusions with partial compressive atelectasis of the lower lobes. Scattered clusters of ground-glass density predominantly in the right lower lobe may represent edema but concerning for infiltrate. Clinical correlation is recommended. There is no pneumothorax. The central airways are patent. Upper Abdomen: No acute abnormality. Musculoskeletal: No chest wall abnormality. No acute or significant osseous findings. Review of the MIP images confirms the above findings. IMPRESSION: 1. No CT evidence of pulmonary artery embolus. 2.  Cardiomegaly with pericardial effusion and small bilateral pleural effusions. 3. Scattered clusters of ground-glass density predominantly in the right lower lobe may represent edema but concerning for infiltrate. Electronically Signed   By: Anner Crete M.D.   On: 06/29/2022 18:55   [place] EKG SITE RITE  Result Date: 06/29/2022 If Site Rite image not attached, placement could not be confirmed due to current cardiac rhythm.  ECHOCARDIOGRAM COMPLETE  Result Date: 06/29/2022    ECHOCARDIOGRAM REPORT   Patient Name:   AIJAH Kinch Date of Exam: 06/29/2022 Medical Rec #:  [id number]      Height:       67.0 in Accession #:    [id number]     Weight:       196.0 lb Date of Birth:  1991-05-18       BSA:          2.005 m Patient Age:    2 years       BP:           193/131 mmHg Patient Gender: F              HR:           119 bpm. Exam Location:  Inpatient Procedure: 2D Echo, Cardiac Doppler, Color Doppler and Intracardiac            Opacification Agent STAT ECHO Indications:    CHF-Acute Systolic [id number]  History:        Patient has no prior history of Echocardiogram examinations.                 Risk Factors:Hypertension.  Sonographer:    Darlina Sicilian RDCS Referring Phys: [id number] [facility] B ROBERTS  Sonographer Comments: Subcostal imaging limited due to gastric pain. IMPRESSIONS  1. Left ventricular ejection fraction, by estimation, is 20 to 25%. The left ventricle has severely decreased function. The left ventricle demonstrates global hypokinesis. The left ventricular internal cavity size was moderately dilated. Left ventricular diastolic parameters are indeterminate. Elevated left atrial pressure.  2. Right ventricular systolic function is moderately reduced. The right ventricular size is normal. There is mildly elevated pulmonary artery systolic pressure.  3. Left atrial size was moderately dilated.  4. Right atrial size was mildly dilated.  5. A small pericardial effusion is present. The pericardial effusion is  localized near the right atrium, anterior to the right ventricle and surrounding the apex.  6. The mitral valve is grossly normal. Severe mitral valve regurgitation. No evidence of mitral stenosis.  7. The aortic valve is tricuspid. Aortic valve regurgitation is not visualized. No aortic stenosis is present.  8. The inferior vena cava is dilated in size with <50% respiratory variability, suggesting right atrial pressure of 15 mmHg. Comparison(s): No prior Echocardiogram. FINDINGS  Left  Ventricle: Left ventricular ejection fraction, by estimation, is 20 to 25%. The left ventricle has severely decreased function. The left ventricle demonstrates global hypokinesis. The left ventricular internal cavity size was moderately dilated. There is no left ventricular hypertrophy. Left ventricular diastolic parameters are indeterminate. Elevated left atrial pressure. Right Ventricle: The right ventricular size is normal. Right ventricular systolic function is moderately reduced. There is mildly elevated pulmonary artery systolic pressure. The tricuspid regurgitant velocity is 2.71 m/s, and with an assumed right atrial pressure of 15 mmHg, the estimated right ventricular systolic pressure is [id number] mmHg. Left Atrium: Left atrial size was moderately dilated. Right Atrium: Right atrial size was mildly dilated. Pericardium: A small pericardial effusion is present. The pericardial effusion is localized near the right atrium, anterior to the right ventricle and surrounding the apex. Mitral Valve: The mitral valve is grossly normal. Severe mitral valve regurgitation. No evidence of mitral valve stenosis. MV peak gradient, 29.6 mmHg. The mean mitral valve gradient is 1.0 mmHg. Tricuspid Valve: The tricuspid valve is grossly normal. Tricuspid valve regurgitation is mild . No evidence of tricuspid stenosis. Aortic Valve: The aortic valve is tricuspid. Aortic valve regurgitation is not visualized. No aortic stenosis is present. Pulmonic  Valve: The pulmonic valve was grossly normal. Pulmonic valve regurgitation is trivial. No evidence of pulmonic stenosis. Aorta: The aortic root and ascending aorta are structurally normal, with no evidence of dilitation. Venous: The inferior vena cava is dilated in size with less than 50% respiratory variability, suggesting right atrial pressure of 15 mmHg. IAS/Shunts: There is right bowing of the interatrial septum, suggestive of elevated left atrial pressure. The interatrial septum was not well visualized.  LEFT VENTRICLE PLAX 2D LVIDd:         6.20 cm      Diastology LVIDs:         5.10 cm      LV e' medial:    7.77 cm/s LV PW:         1.20 cm      LV E/e' medial:  15.3 LV IVS:        1.00 cm      LV e' lateral:   9.48 cm/s LVOT diam:     2.00 cm      LV E/e' lateral: 12.6 LV SV:         30 LV SV Index:   15 LVOT Area:     3.14 cm  LV Volumes (MOD) LV vol d, MOD A2C: 199.0 ml LV vol d, MOD A4C: 173.0 ml LV vol s, MOD A2C: 153.0 ml LV vol s, MOD A4C: 147.0 ml LV SV MOD A2C:     46.0 ml LV SV MOD A4C:     173.0 ml LV SV MOD BP:      37.9 ml RIGHT VENTRICLE RV S prime:     10.30 cm/s TAPSE (M-mode): 1.2 cm LEFT ATRIUM              Index        RIGHT ATRIUM           Index LA diam:        4.70 cm  2.34 cm/m   RA Area:     21.10 cm LA Vol (A2C):   75.6 ml  37.71 ml/m  RA Volume:   67.60 ml  33.72 ml/m LA Vol (A4C):   106.0 ml 52.87 ml/m LA Biplane Vol: 90.2 ml  44.99 ml/m  AORTIC VALVE LVOT Vmax:  83.50 cm/s LVOT Vmean:  48.800 cm/s LVOT VTI:    0.096 m  AORTA Ao Root diam: 3.10 cm Ao Asc diam:  3.10 cm MITRAL VALVE                  TRICUSPID VALVE MV Area (PHT): 6.09 cm       TR Peak grad:   29.4 mmHg MV Area VTI:   5.93 cm       TR Vmax:        271.00 cm/s MV Peak grad:  29.6 mmHg MV Mean grad:  1.0 mmHg       SHUNTS MV Vmax:       2.72 m/s       Systemic VTI:  0.10 m MV Vmean:      32.0 cm/s      Systemic Diam: 2.00 cm MV Decel Time: 125 msec MR Peak grad:    150.8 mmHg MR Mean grad:    96.0 mmHg MR Vmax:          614.00 cm/s MR Vmean:        458.0 cm/s MR PISA:         1.57 cm MR PISA Eff ROA: 9 mm MR PISA Radius:  0.50 cm MV E velocity: 119.00 cm/s Kirk Ruths MD Electronically signed by Kirk Ruths MD Signature Date/Time: 06/29/2022/2:35:16 PM    Final     Cardiac Studies   2D echo 06/29/22 IMPRESSIONS     1. Left ventricular ejection fraction, by estimation, is 20 to 25%. The  left ventricle has severely decreased function. The left ventricle  demonstrates global hypokinesis. The left ventricular internal cavity size  was moderately dilated. Left  ventricular diastolic parameters are indeterminate. Elevated left atrial  pressure.   2. Right ventricular systolic function is moderately reduced. The right  ventricular size is normal. There is mildly elevated pulmonary artery  systolic pressure.   3. Left atrial size was moderately dilated.   4. Right atrial size was mildly dilated.   5. A small pericardial effusion is present. The pericardial effusion is  localized near the right atrium, anterior to the right ventricle and  surrounding the apex.   6. The mitral valve is grossly normal. Severe mitral valve regurgitation.  No evidence of mitral stenosis.   7. The aortic valve is tricuspid. Aortic valve regurgitation is not  visualized. No aortic stenosis is present.   8. The inferior vena cava is dilated in size with <50% respiratory  variability, suggesting right atrial pressure of 15 mmHg.   Comparison(s): No prior Echocardiogram.    Assessment & Plan    Hypertensive urgency and persistent hypertension  Cardiomyopathy question mechanism presumed hypertensive  Sinus tachycardia  Iron deficiency anemia  Hypokalemia   With tachycardia, and persistent hypertension, will increase hydralazine as well and add metoprolol, choosing metoprolol versus carvedilol because of its greater impact on heart rate slowing.  Will start twice daily and consolidate over time  She may also  benefit from ivabradine in this regard.  Renin Aldo levels are still pending  Continue aggressive potassium repletion particularly in the context of ongoing diuresis     For questions or updates, please contact [place] Please consult [email or web address] for contact info under        Signed, Virl Axe, MD  07/01/2022, 1:48 PM

## 2022-07-01 NOTE — Progress Notes (Signed)
Daily Progress Note Intern Pager: [phone]  Patient name: Ann Robbins Medical record number: [id number] Date of birth: 17-Apr-1991 Age: 32 y.o. Gender: female  Primary Care Provider: Charlott Rakes, MD Consultants: Cardiology Code Status: Full  Pt Overview and Major Events to Date:  2/8 - Admitted    Assessment and Plan:  Ann Robbins is a 32 y.o. female with PMH of HTN who presented with abdominal pain. Found to be HTN w/ new onset HFrEF, and elevated troponin. Blood pressure now stable and receiving diuresis.    * Acute HFrEF (heart failure with reduced ejection fraction) (HCC) ECHO shows patient with EF of 20-25%. She has pleural effusions, pulmonary edema and pericardial effusion. Weight is down 2 kg since yesterday, net decrease of 3.4L, Cr improving. Most likely developed HF due to uncontrolled HTN. Will likely continue diureses pending cardiology rec's. No pitting edema in extremities, stomach pain improved and less full/taught since admission - Cardiology consulted, appreciate their recommendations - Consider initiating Bblocker since collection of secondary HTN labs  - Continue 40 mg IV lasix BID  - Continue 25 mg spironolactone daily  - Cardiac telemetry - Strict I's and O's - Daily weights  Hypertension Blood pressure normotensive this morning, off of nitroglycerin gtt. Patient had not been taking medications due to running out months ago and inability to afford. Renal artery ultrasound negative for stenosis. HTN etiology still unclear could be due to hyperaldosteronism or possibly related to OSA.  - F/u 5 HIAA urine, aldosterone plus renin activity ratio, urine catecholamines, urine cortisol, urine metanephrines.  - Consider starting beta-blockers, now that urine studies obtained - Continue hydralazine 25 mg TID - Continue entresto 49-51  - Continue spironolactone 25 mg   Tachycardia Patient continues to be tachycardic to 110s today. Still afebrile without  evidence of infection on exam. CT angio negative for PE. No arrhythmias on monitor. Most likely due to being intravascularly depleted and trying to compensate for low EF.  - Continue to monitor   Hypokalemia K 3.1 this AM. Could be due to hyperaldosteronism and lasix use.  - Replete 80 meq K - 6 pm BMP, plan for one more dose of 40 meq potassium this evening.   AKI (acute kidney injury) ([place]) Creatinine  improved to 1.21 today (1.51), baseline 0.9 2 years ago.  Likely prerenal in setting of acute congestive heart failure.  Caution with fluids. Will continue diureses. - Caution with nephrotoxic agents - Continue 40 mg IV lasix BID - BMP   Anemia Hgb 9.4 this morning. MCV 78.0 and points toward microcytic anemia. Iron studies demonstrate some iron deficiency. Cardiology recommends waiting to transfuse iron until after improvement of HFrEF.  - F/u iron studies  - CBC AM   Elevated troponin HS TP 110 >>232.  No chest pain, but endorsed nausea and abdominal pain.  EKG on presentation, did not show STEMI.  Suspect demand ischemia from acute CHF.  -Cardiology following, appreciate recs    FEN/GI: Heart Healthy PPx: Lovenox Dispo:Home pending clinical improvement . Barriers include diuresis.   Subjective:  Doing well today, feels like her stomach pain is overall improved.   Objective: Temp:  [97.7 F (36.5 C)-97.9 F (36.6 C)] 97.9 F (36.6 C) (02/10 0425) Pulse Rate:  [105-117] 115 (02/10 0835) Resp:  [18] 18 (02/10 0835) BP: (133-157)/(75-118) 133/75 (02/10 0835) SpO2:  [96 %-99 %] 96 % (02/10 0425) Weight:  [87 kg] 87 kg (02/10 0425) Physical Exam: General: Well appearing, NAD Cardiovascular: Tachycardic, Ward,  S1/S2 Respiratory: CTABL Abdomen: Soft, NTTP, non-distended Extremities: no pitting edema  Laboratory: Most recent CBC Lab Results  Component Value Date   WBC 9.2 07/01/2022   HGB 9.4 (L) 07/01/2022   HCT 29.7 (L) 07/01/2022   MCV 78.0 (L) 07/01/2022   PLT  374 07/01/2022   Most recent BMP    Latest Ref Rng & Units 07/01/2022    4:43 AM  BMP  Glucose 70 - 99 mg/dL 93   BUN 6 - 20 mg/dL 11   Creatinine 0.44 - 1.00 mg/dL 1.16   Sodium 135 - 145 mmol/L 134   Potassium 3.5 - 5.1 mmol/L 3.1   Chloride 98 - 111 mmol/L 99   CO2 22 - 32 mmol/L 24   Calcium 8.9 - 10.3 mg/dL 8.3       Holley Bouche, MD 07/01/2022, 11:48 AM  PGY-2, [place] Intern pager: [phone], text pages welcome Secure chat group [employer]

## 2022-07-02 DIAGNOSIS — I429 Cardiomyopathy, unspecified: Secondary | ICD-10-CM

## 2022-07-02 LAB — BASIC METABOLIC PANEL
Anion gap: 10 (ref 5–15)
BUN: 13 mg/dL (ref 6–20)
CO2: 23 mmol/L (ref 22–32)
Calcium: 8.7 mg/dL — ABNORMAL LOW (ref 8.9–10.3)
Chloride: 102 mmol/L (ref 98–111)
Creatinine, Ser: 1.22 mg/dL — ABNORMAL HIGH (ref 0.44–1.00)
GFR, Estimated: >60 mL/min (ref >60)
Glucose, Bld: 118 mg/dL — ABNORMAL HIGH (ref 70–99)
Potassium: 3.6 mmol/L (ref 3.5–5.1)
Sodium: 135 mmol/L (ref 135–145)

## 2022-07-02 LAB — COOXEMETRY PANEL
Carboxyhemoglobin: 1.7 % — ABNORMAL HIGH (ref 0.5–1.5)
Methemoglobin: <0.7 % (ref 0.0–1.5)
O2 Saturation: 63.3 %
Total hemoglobin: 10.6 g/dL — ABNORMAL LOW (ref 12.0–16.0)

## 2022-07-02 MED ORDER — METOPROLOL SUCCINATE ER 50 MG PO TB24
50.0000 mg | ORAL_TABLET | Freq: Two times a day (BID) | ORAL | Status: DC
  Administered 2022-07-02 – 2022-07-04 (×4): 50 mg via ORAL
  Filled 2022-07-02 (×4): qty 1

## 2022-07-02 MED ORDER — POTASSIUM CHLORIDE CRYS ER 20 MEQ PO TBCR
40.0000 meq | EXTENDED_RELEASE_TABLET | Freq: Once | ORAL | Status: AC
  Administered 2022-07-02: 40 meq via ORAL
  Filled 2022-07-02: qty 2

## 2022-07-02 MED ORDER — SACUBITRIL-VALSARTAN 49-51 MG PO TABS
1.0000 | ORAL_TABLET | Freq: Two times a day (BID) | ORAL | Status: DC
  Administered 2022-07-02 – 2022-07-03 (×2): 1 via ORAL
  Filled 2022-07-02 (×2): qty 1

## 2022-07-02 MED ORDER — HYDRALAZINE HCL 25 MG PO TABS
25.0000 mg | ORAL_TABLET | Freq: Three times a day (TID) | ORAL | Status: DC
  Administered 2022-07-02 – 2022-07-03 (×3): 25 mg via ORAL
  Filled 2022-07-02 (×3): qty 1

## 2022-07-02 MED ORDER — POTASSIUM CHLORIDE 20 MEQ PO PACK
40.0000 meq | PACK | Freq: Once | ORAL | Status: AC
  Administered 2022-07-02: 40 meq via ORAL
  Filled 2022-07-02: qty 2

## 2022-07-02 NOTE — Progress Notes (Signed)
Progress Note  Patient Name: Ann Robbins Date of Encounter: 07/02/2022  [facility] Cardiologist: None    Patient Profile     32 y.o. female  with a hx of hypertension who is being seen 06/29/2022 for the evaluation of CHF at the request of Dr. Francia Greaves.  Ejection fraction 20-25% seen by heart failure.  Follow-up hypertensive cardiomyopathy. On Entresto, unable to tolerate nitrates but on hydralazine and spironolactone.  History of hypokalemia so renin and Aldo levels are pending. Iron deficiency anemia  Subjective   Continues to feel better cough is better  Inpatient Medications    Scheduled Meds:  Chlorhexidine Gluconate Cloth  6 each Topical Daily   enoxaparin (LOVENOX) injection  40 mg Subcutaneous Q24H   furosemide  40 mg Intravenous BID   hydrALAZINE  50 mg Oral Q8H   metoprolol succinate  25 mg Oral BID   potassium chloride  40 mEq Oral Once   sacubitril-valsartan  1 tablet Oral BID   sodium chloride flush  10-40 mL Intracatheter Q12H   spironolactone  25 mg Oral Daily   Continuous Infusions:   PRN Meds: acetaminophen, sodium chloride flush   Vital Signs    Vitals:   07/01/22 1930 07/02/22 0017 07/02/22 0538 07/02/22 0838  BP: 116/78 124/84 127/87 108/65  Pulse: (!) 116 (!) 103 (!) 102 (!) 103  Resp: 20 20 18   $ Temp: (!) 97.3 F (36.3 C) (!) 97.3 F (36.3 C) 97.9 F (36.6 C)   TempSrc: Oral Oral Oral   SpO2: 100% 100% 99%   Weight:   86 kg   Height:        Intake/Output Summary (Last 24 hours) at 07/02/2022 1108 Last data filed at 07/02/2022 0940 Gross per 24 hour  Intake 1074 ml  Output 2001 ml  Net -927 ml       07/02/2022    5:38 AM 07/01/2022    4:25 AM 06/30/2022    6:00 AM  Last 3 Weights  Weight (lbs) 189 lb 11.2 oz 191 lb 12.8 oz 196 lb 4.8 oz  Weight (kg) 86.047 kg 87 kg 89.041 kg      Telemetry    Sinus rates are now in the 100-110 range down from 120-130 range- Personally Reviewed  ECG    No new EKG to review -  Personally Reviewed  Physical Exam   Well developed and nourished in no acute distress HENT normal Neck supple with JVP-  6-8 + HJR Clear Regular rate and rhythm, no murmurs or gallops Abd-soft with active BS No Clubbing cyanosis edema Skin-warm and dry A & Oriented  Grossly normal sensory and motor function     Labs    High Sensitivity Troponin:   Recent Labs  Lab 06/29/22 0759 06/29/22 1249 06/29/22 1603 06/29/22 1730  TROPONINIHS 110* 232* 207* 236*       Chemistry Recent Labs  Lab 06/29/22 0759 06/30/22 0450 06/30/22 1841 07/01/22 0443 07/01/22 1700 07/02/22 0549  NA 136 136   < > 134* 135 135  K 2.8* 2.7*   < > 3.1* 3.7 3.6  CL 105 102   < > 99 101 102  CO2 19* 24   < > 24 24 23  $ GLUCOSE 116* 95   < > 93 104* 118*  BUN 12 10   < > 11 12 13  $ CREATININE 1.24* 1.21*   < > 1.16* 1.25* 1.22*  CALCIUM 8.6* 8.0*   < > 8.3* 8.7* 8.7*  PROT  6.7 6.3*  --   --   --   --   ALBUMIN 3.5 3.0*  --   --   --   --   AST 27 23  --   --   --   --   ALT 20 20  --   --   --   --   ALKPHOS 62 55  --   --   --   --   BILITOT 2.5* 2.6*  --   --   --   --   GFRNONAA 59* >60   < > >60 59* >60  ANIONGAP 12 10   < > 11 10 10   $ < > = values in this interval not displayed.      Hematology Recent Labs  Lab 06/29/22 0759 06/30/22 0450 07/01/22 0443  WBC 8.8 9.5 9.2  RBC 3.49* 3.19* 3.81*  HGB 8.7* 8.0* 9.4*  HCT 27.5* 24.8* 29.7*  MCV 78.8* 77.7* 78.0*  MCH 24.9* 25.1* 24.7*  MCHC 31.6 32.3 31.6  RDW 17.1* 17.0* 16.8*  PLT 444* 349 374     BNP Recent Labs  Lab 06/29/22 0759  BNP 618.6*      DDimer No results for input(s): "DDIMER" in the last 168 hours.  Radiology    No results found.  Cardiac Studies   2D echo 06/29/22 IMPRESSIONS     1. Left ventricular ejection fraction, by estimation, is 20 to 25%. The  left ventricle has severely decreased function. The left ventricle  demonstrates global hypokinesis. The left ventricular internal cavity size   was moderately dilated. Left  ventricular diastolic parameters are indeterminate. Elevated left atrial  pressure.   2. Right ventricular systolic function is moderately reduced. The right  ventricular size is normal. There is mildly elevated pulmonary artery  systolic pressure.   3. Left atrial size was moderately dilated.   4. Right atrial size was mildly dilated.   5. A small pericardial effusion is present. The pericardial effusion is  localized near the right atrium, anterior to the right ventricle and  surrounding the apex.   6. The mitral valve is grossly normal. Severe mitral valve regurgitation.  No evidence of mitral stenosis.   7. The aortic valve is tricuspid. Aortic valve regurgitation is not  visualized. No aortic stenosis is present.   8. The inferior vena cava is dilated in size with <50% respiratory  variability, suggesting right atrial pressure of 15 mmHg.   Comparison(s): No prior Echocardiogram.    Assessment & Plan    Hypertensive urgency and persistent hypertension  Cardiomyopathy question mechanism presumed hypertensive  Sinus tachycardia  Iron deficiency anemia  Hypokalemia   Hypokalemia resolved.  Blood pressure is now in the normal range.  Will continue hydralazine but will decrease to 25 and metoprolol.  Heart rate remains fast, so with blood pressure improvement, I will take the liberty of decreasing the Entresto and increasing the metoprolol.  Will defer to family practice, iron replacement therapy  Continue IV Lasix 1 more day   For questions or updates, please contact [place] Please consult [email or web address] for contact info under        Signed, Virl Axe, MD  07/02/2022, 11:08 AM

## 2022-07-02 NOTE — Progress Notes (Signed)
Daily Progress Note Intern Pager: [phone]  Patient name: Ann Robbins Medical record number: [id number] Date of birth: Apr 13, 1991 Age: 32 y.o. Gender: female  Primary Care Provider: Charlott Rakes, MD Consultants: Cardiology Code Status: Full code  Pt Overview and Major Events to Date:  2/8-admitted  Assessment and Plan:  Ann Robbins is a 32 y.o. female with PMH of HTN who presented with abdominal pain. Found to be HTN w/ new onset HFrEF, and elevated troponin. Blood pressure now stable and receiving diuresis.    * Acute HFrEF (heart failure with reduced ejection fraction) (HCC) ECHO shows patient with EF of 20-25%. She has pleural effusions, pulmonary edema and pericardial effusion. Weight is down 1 kg since yesterday, net decrease of 6L, Cr stable.  - Cardiology consulted, appreciate their recommendations - Contine metoprolol 25 mg BID - Continue 40 mg IV lasix BID  - Continue 25 mg spironolactone daily  - Continue hydralazine 40 mg q8h - Cardiac telemetry - Strict I's and O's - Daily weights  Hypertension Blood pressure normotensive this morning, off of nitroglycerin gtt. Patient had not been taking medications due to running out months ago and inability to afford. Renal artery ultrasound negative for stenosis. HTN etiology still unclear could be due to hyperaldosteronism or possibly related to OSA.  - F/u 5 HIAA urine, aldosterone plus renin activity ratio, urine catecholamines, urine cortisol, urine metanephrines.  - Continue hydralazine 25 mg TID - Continue entresto 49-51  - Continue spironolactone 25 mg   Tachycardia Patient continues to be tachycardic to low 100s today. Still afebrile without evidence of infection on exam. CT angio negative for PE. No arrhythmias on monitor. Most likely due to being intravascularly depleted and trying to compensate for low EF.  - Continue to monitor   Hypokalemia K 3.6 this AM. Could be due to hypoaldosteronism and lasix  use.  - Replete 40 meq K and PM 40 meq K - Follow up Renin/Aldosterone  AKI (acute kidney injury) ([place]) Creatinine  improved to 1.22 today, baseline 0.9 2 years ago.  Likely prerenal in setting of acute congestive heart failure.  Caution with fluids. Will continue diureses. - Caution with nephrotoxic agents - Continue 40 mg IV lasix BID - BMP   Anemia Hgb 9.4 yesterday. MCV 78.0 and points toward microcytic anemia. Iron studies demonstrate iron deficiency. Cardiology recommends waiting to transfuse iron until after improvement of HFrEF.  - F/u iron studies  - AM CBC  Elevated troponin HS TP 110 >>232.  No chest pain, but endorsed nausea and abdominal pain.  EKG on presentation, did not show STEMI.  Suspect demand ischemia from acute CHF.  -Cardiology following, appreciate recs   FEN/GI: Heart healthy PPx: Lovenox Dispo: Home pending clinical improvement, continue diuresis and management by cardiology  Subjective:  Feeling better today, no issues, walking in room  Objective: Temp:  [97.3 F (36.3 C)-98.6 F (37 C)] 98.6 F (37 C) (02/11 1121) Pulse Rate:  [99-116] 99 (02/11 1121) Resp:  [18-20] 18 (02/11 1121) BP: (100-129)/(64-87) 100/64 (02/11 1121) SpO2:  [98 %-100 %] 99 % (02/11 1121) Weight:  [86 kg] 86 kg (02/11 0538) Physical Exam: General: NAD, alert and responsive to questions Cardiovascular: RRR no m/r/g, 2+ radial pulses Respiratory: some mild bibasilar crackles, no iWOB on RA Abdomen: Soft, nontender Extremities: No LE edema  Laboratory: Most recent CBC Lab Results  Component Value Date   WBC 9.2 07/01/2022   HGB 9.4 (L) 07/01/2022   HCT 29.7 (L) 07/01/2022  MCV 78.0 (L) 07/01/2022   PLT 374 07/01/2022   Most recent BMP    Latest Ref Rng & Units 07/02/2022    5:49 AM  BMP  Glucose 70 - 99 mg/dL 118   BUN 6 - 20 mg/dL 13   Creatinine 0.44 - 1.00 mg/dL 1.22   Sodium 135 - 145 mmol/L 135   Potassium 3.5 - 5.1 mmol/L 3.6   Chloride 98 - 111  mmol/L 102   CO2 22 - 32 mmol/L 23   Calcium 8.9 - 10.3 mg/dL 8.7     Ann Heck, MD 07/02/2022, 11:52 AM  PGY-2, [place] Intern pager: [phone], text pages welcome Secure chat group [place]

## 2022-07-03 ENCOUNTER — Other Ambulatory Visit (HOSPITAL_COMMUNITY): Payer: Self-pay

## 2022-07-03 ENCOUNTER — Inpatient Hospital Stay (HOSPITAL_COMMUNITY): Payer: Medicaid Other

## 2022-07-03 DIAGNOSIS — I3139 Other pericardial effusion (noninflammatory): Secondary | ICD-10-CM

## 2022-07-03 DIAGNOSIS — I340 Nonrheumatic mitral (valve) insufficiency: Secondary | ICD-10-CM

## 2022-07-03 LAB — COOXEMETRY PANEL
Carboxyhemoglobin: 1.8 % — ABNORMAL HIGH (ref 0.5–1.5)
Methemoglobin: 0.8 % (ref 0.0–1.5)
O2 Saturation: 55.5 %
Total hemoglobin: 10.4 g/dL — ABNORMAL LOW (ref 12.0–16.0)

## 2022-07-03 LAB — BASIC METABOLIC PANEL
Anion gap: 10 (ref 5–15)
BUN: 12 mg/dL (ref 6–20)
CO2: 22 mmol/L (ref 22–32)
Calcium: 8.5 mg/dL — ABNORMAL LOW (ref 8.9–10.3)
Chloride: 102 mmol/L (ref 98–111)
Creatinine, Ser: 1.26 mg/dL — ABNORMAL HIGH (ref 0.44–1.00)
GFR, Estimated: 58 mL/min — ABNORMAL LOW (ref >60)
Glucose, Bld: 98 mg/dL (ref 70–99)
Potassium: 3.6 mmol/L (ref 3.5–5.1)
Sodium: 134 mmol/L — ABNORMAL LOW (ref 135–145)

## 2022-07-03 LAB — CBC
HCT: 31.7 % — ABNORMAL LOW (ref 36.0–46.0)
Hemoglobin: 10 g/dL — ABNORMAL LOW (ref 12.0–15.0)
MCH: 24.8 pg — ABNORMAL LOW (ref 26.0–34.0)
MCHC: 31.5 g/dL (ref 30.0–36.0)
MCV: 78.5 fL — ABNORMAL LOW (ref 80.0–100.0)
Platelets: 392 10*3/uL (ref 150–400)
RBC: 4.04 MIL/uL (ref 3.87–5.11)
RDW: 16.7 % — ABNORMAL HIGH (ref 11.5–15.5)
WBC: 8.2 10*3/uL (ref 4.0–10.5)
nRBC: 0 % (ref 0.0–0.2)

## 2022-07-03 LAB — MAGNESIUM: Magnesium: 2.2 mg/dL (ref 1.7–2.4)

## 2022-07-03 MED ORDER — GADOBUTROL 1 MMOL/ML IV SOLN
8.0000 mL | Freq: Once | INTRAVENOUS | Status: AC | PRN
  Administered 2022-07-03: 8 mL via INTRAVENOUS

## 2022-07-03 MED ORDER — POTASSIUM CHLORIDE 20 MEQ PO PACK
40.0000 meq | PACK | Freq: Once | ORAL | Status: AC
  Administered 2022-07-03: 40 meq via ORAL
  Filled 2022-07-03: qty 2

## 2022-07-03 MED ORDER — SACUBITRIL-VALSARTAN 49-51 MG PO TABS
1.0000 | ORAL_TABLET | Freq: Two times a day (BID) | ORAL | Status: AC
  Administered 2022-07-03: 1 via ORAL
  Filled 2022-07-03: qty 1

## 2022-07-03 MED ORDER — EMPAGLIFLOZIN 10 MG PO TABS
10.0000 mg | ORAL_TABLET | Freq: Every day | ORAL | Status: DC
  Administered 2022-07-03 – 2022-07-04 (×2): 10 mg via ORAL
  Filled 2022-07-03 (×2): qty 1

## 2022-07-03 MED ORDER — SACUBITRIL-VALSARTAN 97-103 MG PO TABS: 1.0000 | ORAL_TABLET | Freq: Two times a day (BID) | ORAL | Status: DC

## 2022-07-03 MED ORDER — DAPAGLIFLOZIN PROPANEDIOL 10 MG PO TABS: 10.0000 mg | ORAL_TABLET | Freq: Every day | ORAL | Status: DC

## 2022-07-03 NOTE — Progress Notes (Addendum)
Daily Progress Note Intern Pager: [phone]  Patient name: Ann Robbins Medical record number: [id number] Date of birth: 07-22-1990 Age: 32 y.o. Gender: female  Primary Care Provider: Charlott Rakes, MD Consultants: Cardiology Code Status: Full code  Pt Overview and Major Events to Date:  2/8-admitted   Assessment and Plan: Ann Robbins is a 32 y.o. female with PMH of HTN who presented with abdominal pain. Found to be HTN w/ new onset HFrEF, and elevated troponin. Blood pressure now stable and receiving diuresis.  * Acute HFrEF (heart failure with reduced ejection fraction) (HCC) ECHO shows patient with EF of 20-25%. She has pleural effusions, pulmonary edema and pericardial effusion. Weight continues to decrease. Cr stable. CVP 5 today. HF will d/c further diuresis for today, got 40 IV this am. Can transition to PO tomorrow.   - Cardiology signed off. HF still following, appreciate recs - Contine metoprolol 50 mg BID - Continue 25 mg spironolactone daily  - Continue hydralazine 40 mg q8h - increase entresto to 97-103 BID - Continue spironolactone 25 mg  - start Jardiance 10 mg - stopped 40 mg IV lasix BID  - Cardiac telemetry - Strict I's and O's - Daily weights - f/u cardiac MRI  Hypertension Blood pressure normotensive this morning, off of nitroglycerin gtt. Patient had not been taking medications due to running out months ago and inability to afford. Renal artery ultrasound negative for stenosis. HTN etiology still unclear could be due to hyperaldosteronism or possibly related to OSA.  - F/u 5 HIAA urine, aldosterone plus renin activity ratio, urine catecholamines, urine cortisol, urine metanephrines.  - Continue hydralazine 25 mg TID  Tachycardia, resolved HR now in 90s. Still afebrile without evidence of infection on exam. CT angio negative for PE - Continue to monitor   Hypokalemia K stable at 3.6 this AM. Could be due to hypoaldosteronism and lasix use.  -  Replete 40 meq K and PM 40 meq K - Follow up Renin/Aldosterone  AKI (acute kidney injury) ([place]) Creatinine stable at 1.26, baseline 0.9 2 years ago.  Likely prerenal in setting of acute congestive heart failure.  - Caution with nephrotoxic agents - stopped 40 mg IV lasix BID - f/u BMP   Anemia Hgb 10. MCV 78.5 and points toward microcytic anemia. Iron studies demonstrate iron deficiency. Cardiology recommends waiting to transfuse iron until after improvement of HFrEF.  - consider IV iron tomorrow - AM CBC  Elevated troponin HS TP 110 >>232.  No chest pain, but endorsed nausea and abdominal pain.  EKG on presentation, did not show STEMI.  Suspect demand ischemia from acute CHF.  -Cardiology following, appreciate recs  FEN/GI: Heart healthy PPx: Lovenox Dispo: Home pending clinical improvement, continue diuresis and management by cardiology  Subjective:  Patient is feeling fine. She endorses good sleep and good appetite. Denies any somatic complaints.  Objective: Temp:  [98.1 F (36.7 C)-98.7 F (37.1 C)] 98.4 F (36.9 C) (02/12 1200) Pulse Rate:  [95-118] 106 (02/12 1200) Resp:  [18-20] 18 (02/12 1200) BP: (103-121)/(65-85) 112/73 (02/12 1200) SpO2:  [96 %-100 %] 100 % (02/12 1200) Weight:  [86 kg-88.8 kg] 86 kg (02/12 0920) Physical Exam: General: well-appearing and in no acute distress HEENT: normocephalic and atraumatic Respiratory: clear to auscultation bilaterally anteriorly, non-labored breathing, and on RA Extremities: moving all extremities spontaneously Gastrointestinal: non-tender and non-distended Cardiovascular: regular rate  Laboratory: Most recent CBC Lab Results  Component Value Date   WBC 8.2 07/03/2022   HGB 10.0 (L)  07/03/2022   HCT 31.7 (L) 07/03/2022   MCV 78.5 (L) 07/03/2022   PLT 392 07/03/2022   Most recent BMP    Latest Ref Rng & Units 07/03/2022    5:23 AM  BMP  Glucose 70 - 99 mg/dL 98   BUN 6 - 20 mg/dL 12   Creatinine 0.44 - 1.00  mg/dL 1.26   Sodium 135 - 145 mmol/L 134   Potassium 3.5 - 5.1 mmol/L 3.6   Chloride 98 - 111 mmol/L 102   CO2 22 - 32 mmol/L 22   Calcium 8.9 - 10.3 mg/dL 8.5    Camelia Phenes, MD 07/03/2022, 12:14 PM  PGY-1, [place] Intern pager: [phone], text pages welcome Secure chat group [place]

## 2022-07-03 NOTE — Progress Notes (Signed)
Paged by nurse due to hypotension to 90/66, patient slightly lightheaded. Decreased entresto to 49-51 dose, continued metoprolol. No more lasix doses tonight.  - BP improved to 109/68.  - Continue to monitor

## 2022-07-03 NOTE — Progress Notes (Signed)
Rounding Note    Patient Name: Ann Robbins Date of Encounter: 07/03/2022  [facility] Cardiologist: Reola Calkins [place])   Subjective   Feeling much better.  Able to lie completely supine in bed. Resting tachycardia 110 bpm, but blood pressure now normal range. Net diuresis 6.5 L since admission. Stable creatinine around 1.2, potassium low limit of normal.  Inpatient Medications    Scheduled Meds:  Chlorhexidine Gluconate Cloth  6 each Topical Daily   enoxaparin (LOVENOX) injection  40 mg Subcutaneous Q24H   metoprolol succinate  50 mg Oral BID   sacubitril-valsartan  1 tablet Oral BID   sodium chloride flush  10-40 mL Intracatheter Q12H   spironolactone  25 mg Oral Daily   Continuous Infusions:  PRN Meds: acetaminophen, sodium chloride flush   Vital Signs    Vitals:   07/03/22 0726 07/03/22 0727 07/03/22 0837 07/03/22 0920  BP:  119/77 114/73   Pulse: 96 95 (!) 103   Resp:  18    Temp: 98.2 F (36.8 C) 98.4 F (36.9 C)    TempSrc: Oral Oral    SpO2: 100% 100%    Weight:    86 kg  Height:        Intake/Output Summary (Last 24 hours) at 07/03/2022 1005 Last data filed at 07/03/2022 0831 Gross per 24 hour  Intake 597 ml  Output 1700 ml  Net -1103 ml      07/03/2022    9:20 AM 07/03/2022    4:46 AM 07/02/2022    5:38 AM  Last 3 Weights  Weight (lbs) 189 lb 9.5 oz 195 lb 12.3 oz 189 lb 11.2 oz  Weight (kg) 86 kg 88.8 kg 86.047 kg      Telemetry    Sinus rhythm- Personally Reviewed  ECG    No new tracing- Personally Reviewed  Physical Exam  Appears comfortable lying fully flat in bed for 10 minutes during our interview. GEN: No acute distress.   Neck: No JVD Cardiac: RRR, no murmurs, rubs, or gallops.  Respiratory: Clear to auscultation bilaterally. GI: Soft, nontender, non-distended  MS: No edema; No deformity. Neuro:  Nonfocal  Psych: Normal affect   Labs    High Sensitivity Troponin:   Recent Labs  Lab 06/29/22 0759  06/29/22 1249 06/29/22 1603 06/29/22 1730  TROPONINIHS 110* 232* 207* 236*     Chemistry Recent Labs  Lab 06/29/22 0759 06/29/22 0759 06/30/22 0450 06/30/22 1841 07/01/22 0443 07/01/22 1700 07/02/22 0549 07/03/22 0523  NA 136  --  136 135 134* 135 135 134*  K 2.8*  --  2.7* 3.2* 3.1* 3.7 3.6 3.6  CL 105  --  102 100 99 101 102 102  CO2 19*  --  24 23 24 24 23 22  $ GLUCOSE 116*  --  95 84 93 104* 118* 98  BUN 12  --  10 13 11 12 13 12  $ CREATININE 1.24*  --  1.21* 1.51* 1.16* 1.25* 1.22* 1.26*  CALCIUM 8.6*  --  8.0* 8.8* 8.3* 8.7* 8.7* 8.5*  MG  --    < > 1.7 2.4 2.1  --   --  2.2  PROT 6.7  --  6.3*  --   --   --   --   --   ALBUMIN 3.5  --  3.0*  --   --   --   --   --   AST 27  --  23  --   --   --   --   --  ALT 20  --  20  --   --   --   --   --   ALKPHOS 62  --  55  --   --   --   --   --   BILITOT 2.5*  --  2.6*  --   --   --   --   --   GFRNONAA 59*  --  >60 47* >60 59* >60 58*  ANIONGAP 12  --  10 12 11 10 10 10   $ < > = values in this interval not displayed.    Lipids  Recent Labs  Lab 06/29/22 1603  CHOL 117  TRIG 72  HDL 32*  LDLCALC 71  CHOLHDL 3.7    Hematology Recent Labs  Lab 06/30/22 0450 07/01/22 0443 07/03/22 0523  WBC 9.5 9.2 8.2  RBC 3.19* 3.81* 4.04  HGB 8.0* 9.4* 10.0*  HCT 24.8* 29.7* 31.7*  MCV 77.7* 78.0* 78.5*  MCH 25.1* 24.7* 24.8*  MCHC 32.3 31.6 31.5  RDW 17.0* 16.8* 16.7*  PLT 349 374 392   Thyroid  Recent Labs  Lab 06/29/22 1601  TSH 0.741    BNP Recent Labs  Lab 06/29/22 0759  BNP 618.6*    DDimer No results for input(s): "DDIMER" in the last 168 hours.   Radiology    No results found.  Cardiac Studies   2D echo 06/29/22 IMPRESSIONS     1. Left ventricular ejection fraction, by estimation, is 20 to 25%. The  left ventricle has severely decreased function. The left ventricle  demonstrates global hypokinesis. The left ventricular internal cavity size  was moderately dilated. Left  ventricular  diastolic parameters are indeterminate. Elevated left atrial  pressure.   2. Right ventricular systolic function is moderately reduced. The right  ventricular size is normal. There is mildly elevated pulmonary artery  systolic pressure.   3. Left atrial size was moderately dilated.   4. Right atrial size was mildly dilated.   5. A small pericardial effusion is present. The pericardial effusion is  localized near the right atrium, anterior to the right ventricle and  surrounding the apex.   6. The mitral valve is grossly normal. Severe mitral valve regurgitation.  No evidence of mitral stenosis.   7. The aortic valve is tricuspid. Aortic valve regurgitation is not  visualized. No aortic stenosis is present.   8. The inferior vena cava is dilated in size with <50% respiratory  variability, suggesting right atrial pressure of 15 mmHg.   Comparison(s): No prior Echocardiogram.     Patient Profile     32 y.o. female with severe hypertension presenting with acute on chronic systolic heart failure, presumably due to hypertensive cardiomyopathy.  Additional problems include hypokalemia (resolved, workup for hyperaldosteronism in progress), mild iron deficiency anemia.  Assessment & Plan    Appears clinically euvolemic or close to it, other than resting tachycardia. Switch to oral diuretics, furosemide 80 mg daily. Had a lengthy discussion regarding sodium restriction and diet, regular activity, signs and symptoms of heart failure exacerbation and the need for daily weight monitoring. Workup for pheochromocytoma/hyperaldosteronism, in process. [place] will sign off.   Medication Recommendations: Entresto 97/103 mg twice daily, spironolactone 25 mg daily, metoprolol succinate 100 mg daily, Jardiance or Farxiga 10 mg daily, furosemide 80 mg once daily Other recommendations (labs, testing, etc): Repeat basic metabolic panel at follow-up visit.  Daily weights.  Call if weight  increases by 3 pounds in 24 hours or  if at any point she gains more than 5 pounds from current weight (86 kg / 190 pounds). Follow up as an outpatient: We are making arrangements for heart failure transition of care follow-up at the end of this month.  Will need assistance with expensive heart failure medications Delene Loll and [place]).  For questions or updates, please contact [place] Please consult [email or web address] for contact info under        Signed, Sanda Klein, MD  07/03/2022, 10:05 AM

## 2022-07-03 NOTE — Progress Notes (Addendum)
Advanced Heart Failure Rounding Note  PCP-Cardiologist: None   Subjective:    Diuresed well over the weekend with 40 IV lasix BID.   SCr 1.26  Up 6 lbs per documentation. Per patient report she wasn't weighted this morning but weight documented?. Asked NT to please reweight.   Not off NTG gtt. BP improving with GDMT titration. Walked around this weekend with no difficulty. SOB has much improved since diuresed per patient report.   Objective:   Weight Range: 88.8 kg Body mass index is 30.66 kg/m.  Vital Signs:   Temp:  [98.1 F (36.7 C)-98.7 F (37.1 C)] 98.4 F (36.9 C) (02/12 0727) Pulse Rate:  [95-118] 103 (02/12 0837) Resp:  [18-20] 18 (02/12 0727) BP: (100-121)/(64-85) 114/73 (02/12 0837) SpO2:  [96 %-100 %] 100 % (02/12 0727) Weight:  [88.8 kg] 88.8 kg (02/12 0446) Last BM Date : 07/02/22 Weight change: Filed Weights   07/01/22 0425 07/02/22 0538 07/03/22 0446  Weight: 87 kg 86 kg 88.8 kg   Intake/Output:   Intake/Output Summary (Last 24 hours) at 07/03/2022 0903 Last data filed at 07/03/2022 0831 Gross per 24 hour  Intake 834 ml  Output 1700 ml  Net -866 ml    Physical Exam  General:  well appearing.  No respiratory difficulty HEENT: normal Neck: supple. No JVD. Carotids 2+ bilat; no bruits. No lymphadenopathy or thyromegaly appreciated. Cor: PMI nondisplaced. Regular rate & rhythm. No rubs, gallops or murmurs. Lungs: clear Abdomen: soft, nontender, nondistended. No hepatosplenomegaly. No bruits or masses. Good bowel sounds. Extremities: no cyanosis, clubbing, rash, edema. PICC RUE  Neuro: alert & oriented x 3, cranial nerves grossly intact. moves all 4 extremities w/o difficulty. Affect pleasant.  Telemetry   NSR -ST low 100s (Personally reviewed)   EKG  No new EKG to review Labs    CBC Recent Labs    07/01/22 0443 07/03/22 0523  WBC 9.2 8.2  HGB 9.4* 10.0*  HCT 29.7* 31.7*  MCV 78.0* 78.5*  PLT 374 0000000   Basic Metabolic Panel Recent  Labs    07/01/22 0443 07/01/22 1700 07/02/22 0549 07/03/22 0523  NA 134*   < > 135 134*  K 3.1*   < > 3.6 3.6  CL 99   < > 102 102  CO2 24   < > 23 22  GLUCOSE 93   < > 118* 98  BUN 11   < > 13 12  CREATININE 1.16*   < > 1.22* 1.26*  CALCIUM 8.3*   < > 8.7* 8.5*  MG 2.1  --   --  2.2   < > = values in this interval not displayed.   Liver Function Tests No results for input(s): "AST", "ALT", "ALKPHOS", "BILITOT", "PROT", "ALBUMIN" in the last 72 hours.  No results for input(s): "LIPASE", "AMYLASE" in the last 72 hours.  Cardiac Enzymes No results for input(s): "CKTOTAL", "CKMB", "CKMBINDEX", "TROPONINI" in the last 72 hours.  BNP: BNP (last 3 results) Recent Labs    06/29/22 0759  BNP 618.6*    ProBNP (last 3 results) No results for input(s): "PROBNP" in the last 8760 hours. D-Dimer No results for input(s): "DDIMER" in the last 72 hours. Hemoglobin A1C No results for input(s): "HGBA1C" in the last 72 hours. Fasting Lipid Panel No results for input(s): "CHOL", "HDL", "LDLCALC", "TRIG", "CHOLHDL", "LDLDIRECT" in the last 72 hours.  Thyroid Function Tests No results for input(s): "TSH", "T4TOTAL", "T3FREE", "THYROIDAB" in the last 72 hours.  Invalid input(s): "  FREET3" Other results:  Imaging   No results found.  Medications:   Scheduled Medications:  Chlorhexidine Gluconate Cloth  6 each Topical Daily   enoxaparin (LOVENOX) injection  40 mg Subcutaneous Q24H   hydrALAZINE  25 mg Oral Q8H   metoprolol succinate  50 mg Oral BID   potassium chloride  40 mEq Oral Once   sacubitril-valsartan  1 tablet Oral BID   sodium chloride flush  10-40 mL Intracatheter Q12H   spironolactone  25 mg Oral Daily   Infusions:   PRN Medications: acetaminophen, sodium chloride flush  Patient Profile  32 y/o woman with history of HTN (out of meds x 1 year) admitted acute systolic heart failure and hypertensive urgency.   Echo EF 20-25%. RV mod reduced. Severe  MR Assessment/Plan  1. Acute Systolic Heart Failure Gestational hypertension with her first child 14 years ago. Remained hypertensive with 2 subsequent pregnancies. Stopped HTN medications ~ 1 year ago. Suspect hypertensive cardiomyopathy. TSH pending. Doubt ICM. Possible familial component Mom recently diagnosed with heart failure. HS Trop 110>232 BNP 618. Co-ox 79% on admit, 56% today? - Echo EF 20-25% RV moderately reduced, IVC dilated  - Diuresed well with 40 IV lasix BID. CVP 5 today. Will d/c further diuresis for today, got 40 IV this am. Can transition to PO tomorrow.  - Now off NTG gtt. Continue oral GDMT titration  - Stop hydralazine 25 mg tid - Increase Entresto 49-51>97/102 bid  - Continue Spiro 25 mg daily  - Continue metoprolol 50 mg BID - Low suspicion for CAD so likely doesn't need cath.  - will order cMRI  2. Hypertensive Urgency  - improving, weaning off NTG gtt Started on NTG drip.  - continue GDMT titration per above  - Renal US (-) - Check aldoseterone, renin, PRA (in process) - Needs sleep study once she gets insurance.    3. Hypokalemia/ Hypomagnesemia  - K 2.8 on admit, 3.6 today. Mg 2.2 - continue spiro  - w/u to r/o hyperaldosteronism per above    4.  ID Anemia  - Fe 18, Ferritin 20, Tsat 5  - needs IV Fe. Would not give Feraheme until procedures completed. May need cardiac MRI.    5. Social  - Uninsured and will need medications at the time of discharge.    Length of Stay: [place], NP  07/03/2022, 9:03 AM  Advanced Heart Failure Team Pager [phone] (M-F; 7a - 5p)  Please contact [facility] for night-coverage after hours (5p -7a ) and weekends on [email or web address]

## 2022-07-03 NOTE — Progress Notes (Addendum)
Patient c/o lightheadedness on exertion relieved by rest. Vital signs obtained; blood pressure 90/66, heart rate sustaining in 100s. Notified on call family medicine residency intern.  Verbal order received to recheck blood pressure; hold entresto, provide metoprolol if SBP greater than or equal to 100.  Reduced dose of entresto ordered due to improvement in SBP. Provided by RN.

## 2022-07-03 NOTE — Progress Notes (Signed)
   07/03/22 2009  Assess: MEWS Score  Temp 97.9 F (36.6 C)  BP 90/66  MAP (mmHg) 74  Pulse Rate (!) 110  ECG Heart Rate (!) 109  Resp 18  SpO2 99 %  O2 Device Room Air  Assess: MEWS Score  MEWS Temp 0  MEWS Systolic 1  MEWS Pulse 1  MEWS RR 0  MEWS LOC 0  MEWS Score 2  MEWS Score Color Yellow  Assess: if the MEWS score is Yellow or Red  Were vital signs taken at a resting state? Yes  Focused Assessment Change from prior assessment (see assessment flowsheet)  Does the patient meet 2 or more of the SIRS criteria? No  Does the patient have a confirmed or suspected source of infection? No  Provider and Rapid Response Notified? No  MEWS guidelines implemented  Yes, yellow  Treat  MEWS Interventions Considered administering scheduled or prn medications/treatments as ordered  Take Vital Signs  Increase Vital Sign Frequency  Yellow: Q2hr x1, continue Q4hrs until patient remains green for 12hrs  Escalate  MEWS: Escalate Yellow: Discuss with charge nurse and consider notifying provider and/or RRT  Notify: Charge Nurse/RN  Name of Charge Nurse/RN Notified Santiago Glad RN  Assess: SIRS CRITERIA  SIRS Temperature  0  SIRS Pulse 1  SIRS Respirations  0  SIRS WBC 0  SIRS Score Sum  1   Physicians notified separately from MEWS event. See prior progress note for assessment and interventions.

## 2022-07-04 ENCOUNTER — Other Ambulatory Visit (HOSPITAL_COMMUNITY): Payer: Self-pay

## 2022-07-04 LAB — CBC
HCT: 31.9 % — ABNORMAL LOW (ref 36.0–46.0)
Hemoglobin: 10.1 g/dL — ABNORMAL LOW (ref 12.0–15.0)
MCH: 24.8 pg — ABNORMAL LOW (ref 26.0–34.0)
MCHC: 31.7 g/dL (ref 30.0–36.0)
MCV: 78.2 fL — ABNORMAL LOW (ref 80.0–100.0)
Platelets: 395 10*3/uL (ref 150–400)
RBC: 4.08 MIL/uL (ref 3.87–5.11)
RDW: 16.8 % — ABNORMAL HIGH (ref 11.5–15.5)
WBC: 7.6 10*3/uL (ref 4.0–10.5)
nRBC: 0 % (ref 0.0–0.2)

## 2022-07-04 LAB — 5 HIAA, QUANTITATIVE, URINE, 24 HOUR
5-HIAA, Ur: 0.7 mg/L
5-HIAA,Quant.,24 Hr Urine: 0 mg/24 hr (ref 0.0–14.9)

## 2022-07-04 LAB — BASIC METABOLIC PANEL
Anion gap: 10 (ref 5–15)
BUN: 15 mg/dL (ref 6–20)
CO2: 22 mmol/L (ref 22–32)
Calcium: 8.7 mg/dL — ABNORMAL LOW (ref 8.9–10.3)
Chloride: 102 mmol/L (ref 98–111)
Creatinine, Ser: 1.21 mg/dL — ABNORMAL HIGH (ref 0.44–1.00)
GFR, Estimated: >60 mL/min (ref >60)
Glucose, Bld: 92 mg/dL (ref 70–99)
Potassium: 3.7 mmol/L (ref 3.5–5.1)
Sodium: 134 mmol/L — ABNORMAL LOW (ref 135–145)

## 2022-07-04 LAB — CORTISOL, URINE, FREE
Cortisol (Ur), Free: 0 ug/24 hr — ABNORMAL LOW (ref 6–42)
Cortisol,F,ug/L,U: 4 ug/L

## 2022-07-04 LAB — MAGNESIUM: Magnesium: 2.2 mg/dL (ref 1.7–2.4)

## 2022-07-04 MED ORDER — SACUBITRIL-VALSARTAN 49-51 MG PO TABS: 1.0000 | ORAL_TABLET | Freq: Two times a day (BID) | ORAL | 0 refills | Status: DC

## 2022-07-04 MED ORDER — SACUBITRIL-VALSARTAN 49-51 MG PO TABS
1.0000 | ORAL_TABLET | Freq: Two times a day (BID) | ORAL | 0 refills | Status: DC
  Filled 2022-07-04: qty 60, 30d supply, fill #0

## 2022-07-04 MED ORDER — EMPAGLIFLOZIN 10 MG PO TABS
10.0000 mg | ORAL_TABLET | Freq: Every day | ORAL | 0 refills | Status: DC
  Filled 2022-07-04: qty 30, 30d supply, fill #0

## 2022-07-04 MED ORDER — POTASSIUM CHLORIDE 20 MEQ PO PACK
40.0000 meq | PACK | Freq: Once | ORAL | Status: AC
  Administered 2022-07-04: 40 meq via ORAL
  Filled 2022-07-04: qty 2

## 2022-07-04 MED ORDER — SACUBITRIL-VALSARTAN 49-51 MG PO TABS
1.0000 | ORAL_TABLET | Freq: Two times a day (BID) | ORAL | Status: DC
  Administered 2022-07-04: 1 via ORAL
  Filled 2022-07-04: qty 1

## 2022-07-04 MED ORDER — SODIUM CHLORIDE 0.9 % IV SOLN
510.0000 mg | Freq: Once | INTRAVENOUS | Status: AC
  Administered 2022-07-04: 510 mg via INTRAVENOUS
  Filled 2022-07-04: qty 17

## 2022-07-04 MED ORDER — METOPROLOL SUCCINATE ER 50 MG PO TB24
50.0000 mg | ORAL_TABLET | Freq: Two times a day (BID) | ORAL | 0 refills | Status: DC
  Filled 2022-07-04: qty 60, 30d supply, fill #0

## 2022-07-04 MED ORDER — EMPAGLIFLOZIN 10 MG PO TABS: 10.0000 mg | ORAL_TABLET | Freq: Every day | ORAL | 0 refills | Status: DC

## 2022-07-04 MED ORDER — ACETAMINOPHEN 325 MG PO TABS: 650.0000 mg | ORAL_TABLET | Freq: Four times a day (QID) | ORAL | Status: AC | PRN

## 2022-07-04 MED ORDER — SPIRONOLACTONE 25 MG PO TABS: 25.0000 mg | ORAL_TABLET | Freq: Every day | ORAL | 0 refills | Status: DC

## 2022-07-04 MED ORDER — SPIRONOLACTONE 25 MG PO TABS
25.0000 mg | ORAL_TABLET | Freq: Every day | ORAL | 0 refills | Status: DC
  Filled 2022-07-04: qty 30, 30d supply, fill #0

## 2022-07-04 MED ORDER — METOPROLOL SUCCINATE ER 50 MG PO TB24: 50.0000 mg | ORAL_TABLET | Freq: Two times a day (BID) | ORAL | 0 refills | Status: DC

## 2022-07-04 NOTE — Discharge Summary (Signed)
[facility] Discharge Summary  Patient name: Ann Robbins Medical record number: [id number] Date of birth: April 27, 1991 Age: 32 y.o. Gender: female Date of Admission: 06/29/2022  Date of Discharge: 07/04/2022  Admitting Physician: Gerrit Heck, MD  Primary Care Provider: Charlott Rakes, MD Consultants: heart failure, cardiology  Indication for Hospitalization: new onset HFrEF, hypertensive emergency  Brief Hospital Course:  Ann Robbins is a 32 y.o. female who was admitted for new onset HFrEF and hypertensive emergency. Prior PMH of HTN, otherwise no known medical problems.  Acute Systolic Heart Failure  Presented with subacute (~6 weeks) DOE and abdominal pain. Imaging revealed pulmonary edema, small bilateral pleural effusions, and pericardial effusion concerning for CHF. BNP 618.6 on admission. Echo was obtained and showed EF 20-25%. Cardiac MRI shows EF 28% with moderate mitral regurgitation and pericardial effusion. 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 Heart failure team followed patient throughout hospitalization. She was diuresed and pharmacologic treatment was optimized with plan to follow-up in heart failure clinic.  Hypertension BP severely elevated on admission. Had been off her meds for several months due to financial constraints. Cardiology was consulted. Treated with nitro drip initially. Ultrasound negative for renal artery stenosis. Further workup for secondary causes of hypertension still pending.  Anemia Iron studies consistent with iron deficiency anemia (Fe 18, ferritin 20, Tsat 5). Given IV iron before discharge.  PCP f/u items: Needs sleep study F/u secondary HTN causes workup Monitor weight  Labs to follow up: renin/aldosterone, catecholamines, 5 HIAA, urine metanephrines, urine cortisol  Discharge Diagnoses/Problem List:  Acute HFrEF (heart failure with reduced ejection fraction) Hypertension AKI  Disposition:  home  Discharge Condition: stable  Discharge Exam: BP 118/78 (BP Location: Left Arm)   Pulse 95   Temp 98.4 F (36.9 C) (Oral)   Resp 16   Ht 5' 7"$  (1.702 m)   Wt 85.5 kg   SpO2 98%   BMI 29.54 kg/m   General: well-appearing and in no acute distress HEENT: normocephalic and atraumatic Respiratory: clear to auscultation bilaterally posteriorly, non-labored breathing, and on RA Extremities: moving all extremities spontaneously Gastrointestinal: non-tender and non-distended Cardiovascular: regular rate  Significant Labs and Imaging:  Recent Labs  Lab 07/03/22 0523 07/04/22 0547  WBC 8.2 7.6  HGB 10.0* 10.1*  HCT 31.7* 31.9*  PLT 392 395   Recent Labs  Lab 07/03/22 0523 07/04/22 0547  NA 134* 134*  K 3.6 3.7  CL 102 102  CO2 22 22  GLUCOSE 98 92  BUN 12 15  CREATININE 1.26* 1.21*  CALCIUM 8.5* 8.7*  MG 2.2 2.2   Results/Tests Pending at Time of Discharge: Labs to follow up: renin/aldosterone, catecholamines, 5 HIAA, urine metanephrines, urine cortisol  Discharge Medications:  Allergies as of 07/04/2022   No Known Allergies      Medication List     STOP taking these medications    amLODipine 10 MG tablet Commonly known as: NORVASC   benzonatate 100 MG capsule Commonly known as: TESSALON   hydrochlorothiazide 25 MG tablet Commonly known as: HYDRODIURIL   prenatal vitamin w/FE, FA 27-1 MG Tabs tablet   promethazine-dextromethorphan 6.25-15 MG/5ML syrup Commonly known as: PROMETHAZINE-DM       TAKE these medications    acetaminophen 325 MG tablet Commonly known as: TYLENOL Take 2 tablets (650 mg total) by mouth every 6 (six) hours as needed for moderate pain.   empagliflozin 10 MG Tabs tablet Commonly known as: JARDIANCE Take 1 tablet (10 mg total) by mouth daily. Start taking on:  July 05, 2022   metoprolol succinate 50 MG 24 hr tablet Commonly known as: TOPROL-XL Take 1 tablet (50 mg total) by mouth 2 (two) times daily. Take with  or immediately following a meal.   sacubitril-valsartan 49-51 MG Commonly known as: ENTRESTO Take 1 tablet by mouth 2 (two) times daily.   spironolactone 25 MG tablet Commonly known as: ALDACTONE Take 1 tablet (25 mg total) by mouth daily. Start taking on: July 05, 2022        Discharge Instructions: Please refer to Patient Instructions section of EMR for full details.  Patient was counseled important signs and symptoms that should prompt return to medical care, changes in medications, dietary instructions, activity restrictions, and follow up appointments.   Follow-Up Appointments:  Follow-up Information     [facility] Follow up on 07/13/2022.   Specialty: Cardiology Why: Follow up in the [facility] 07/13/22 at 0830 am  Entrance C, Free valet. Please bring all medications with you Contact information: [id number] [place]        Charlott Rakes, MD Follow up.   Specialty: Family Medicine Contact information: [place] [address] [phone]                 Camelia Phenes, MD 07/04/2022, 10:48 AM PGY-1, [place]

## 2022-07-04 NOTE — TOC Progression Note (Signed)
Transition of Care [facility]) - Progression Note    Patient Details  Name: Ann Robbins MRN: [id number] Date of Birth: [birth year]  Transition [facility]) CM/SW Contact  Erenest Rasher, RN Phone Number: [phone] 07/04/2022, 11:54 AM  Clinical Narrative:     Pt PCP appt arranged with Patient [facility] and placed on AVS. Pt has scale provided by HF clinic to do daily weights. Confirming meds will be covered under HF funds. Pharmacy to provide patient assistance applications for [place]. Requested a note for work for patient.  Expected Discharge Plan: Home/Self Care Barriers to Discharge: No Barriers Identified  Expected Discharge Plan and Services   Discharge Planning Services: Medication Assistance, Follow-up appt scheduled   Living arrangements for the past 2 months: Single Family Home Expected Discharge Date: 07/04/22                                     Social Determinants of Health (SDOH) Interventions SDOH Screenings   Food Insecurity: No Food Insecurity (06/29/2022)  Housing: Low Risk  (06/29/2022)  Transportation Needs: No Transportation Needs (06/29/2022)  Utilities: Not At Risk (06/29/2022)  Depression (PHQ2-9): Low Risk  (12/14/2020)  Financial Resource Strain: Low Risk  (06/30/2022)  Tobacco Use: Low Risk  (06/29/2022)    Readmission Risk Interventions     No data to display

## 2022-07-04 NOTE — Progress Notes (Signed)
Advanced Heart Failure Rounding Note  PCP-Cardiologist: None   Subjective:   Diuresed well over the weekend with 40 IV lasix BID.   SCr 1.2, weight stable  Off NTG gtt 2/9. BP improving with GDMT titration. Evening dose of Entresto increased to 97/103, patient was hypotensive and dizzy so decreased back to 49/51.   Objective:   cMRI 2/12 1. Mild LV dilatation, moderate hypertrophy, and severe systolic dysfunction (EF 99991111) 2.  Normal RV size with severe systolic dysfunction (EF 0000000) 3. RV insertion site late gadolinium enhancement, which is a nonspecific finding often seen in setting of elevated pulmonary pressures 4.  Moderate mitral regurgitation (regurgitant fraction 35%) 5. Moderate pericardial effusion, measuring up to 17m adjacent to right atrium  Weight Range: 85.5 kg Body mass index is 29.54 kg/m.  Vital Signs:   Temp:  [97.9 F (36.6 C)-98.5 F (36.9 C)] 98.5 F (36.9 C) (02/13 0339) Pulse Rate:  [91-110] 91 (02/13 0339) Resp:  [18] 18 (02/13 0339) BP: (90-126)/(65-81) 109/76 (02/13 0339) SpO2:  [96 %-100 %] 100 % (02/13 0339) Weight:  [85.5 kg-86 kg] 85.5 kg (02/13 0339) Last BM Date : 07/02/22 Weight change: Filed Weights   07/03/22 0446 07/03/22 0920 07/04/22 0339  Weight: 88.8 kg 86 kg 85.5 kg   Intake/Output:   Intake/Output Summary (Last 24 hours) at 07/04/2022 0707 Last data filed at 07/04/2022 0015 Gross per 24 hour  Intake 390 ml  Output 1100 ml  Net -710 ml    Physical Exam  CVP <5 General:  well appearing.  No respiratory difficulty HEENT: normal Neck: supple. No JVD. Carotids 2+ bilat; no bruits. No lymphadenopathy or thyromegaly appreciated. Cor: PMI nondisplaced. Regular rate & rhythm. No rubs, gallops or murmurs. Lungs: clear Abdomen: soft, nontender, nondistended. No hepatosplenomegaly. No bruits or masses. Good bowel sounds. Extremities: no cyanosis, clubbing, rash, edema. PICC RUE  Neuro: alert & oriented x 3, cranial nerves  grossly intact. moves all 4 extremities w/o difficulty. Affect pleasant.  Telemetry  NSR 90s (Personally reviewed)   EKG  No new EKG to review Labs    CBC Recent Labs    07/03/22 0523 07/04/22 0547  WBC 8.2 7.6  HGB 10.0* 10.1*  HCT 31.7* 31.9*  MCV 78.5* 78.2*  PLT 392 [id number]  Basic Metabolic Panel Recent Labs    07/03/22 0523 07/04/22 0547  NA 134* 134*  K 3.6 3.7  CL 102 102  CO2 22 22  GLUCOSE 98 92  BUN 12 15  CREATININE 1.26* 1.21*  CALCIUM 8.5* 8.7*  MG 2.2 2.2   Liver Function Tests No results for input(s): "AST", "ALT", "ALKPHOS", "BILITOT", "PROT", "ALBUMIN" in the last 72 hours.  No results for input(s): "LIPASE", "AMYLASE" in the last 72 hours.  Cardiac Enzymes No results for input(s): "CKTOTAL", "CKMB", "CKMBINDEX", "TROPONINI" in the last 72 hours.  BNP: BNP (last 3 results) Recent Labs    06/29/22 0759  BNP 618.6*    ProBNP (last 3 results) No results for input(s): "PROBNP" in the last 8760 hours. D-Dimer No results for input(s): "DDIMER" in the last 72 hours. Hemoglobin A1C No results for input(s): "HGBA1C" in the last 72 hours. Fasting Lipid Panel No results for input(s): "CHOL", "HDL", "LDLCALC", "TRIG", "CHOLHDL", "LDLDIRECT" in the last 72 hours.  Thyroid Function Tests No results for input(s): "TSH", "T4TOTAL", "T3FREE", "THYROIDAB" in the last 72 hours.  Invalid input(s): "FREET3" Other results:  Imaging   MR CARDIAC VELOCITY FLOW MAP Result Date: 07/03/2022 CLINICAL  DATA:  32F presents with heart failure. Echo with EF 20-25%, moderate RV dysfunction, severe MR EXAM: CARDIAC MRI TECHNIQUE: The patient was scanned on a 1.5 Tesla Siemens magnet. A dedicated cardiac coil was used. Functional imaging was done using [place] sequences. 2,3, and 4 chamber views were done to assess for RWMA's. Modified Simpson's rule using a short axis stack was used to calculate an ejection fraction on a dedicated work Conservation officer, nature. The  patient received 8 cc of Gadavist. After 10 minutes inversion recovery sequences were used to assess for infiltration and scar tissue. Phase contrast velocity mapping was performed above the aortic and pulmonic valves CONTRAST:  8 cc  of Gadavist FINDINGS: Left ventricle: -Mild dilatation -Moderate hypertrophy -Severe systolic dysfunction -Mild ECV elevation (30%) -Normal T2 values -RV insertion site LGE LV EF:  22% (Normal 56-78%) Absolute volumes: LV EDV: 262m (Normal 52-141 mL) LV ESV: 1676m(Normal 13-51 mL) LV SV: 4636mNormal 33-97 mL) CO: 4.8L/min (Normal 2.7-6.0 L/min) Indexed volumes: LV EDV: 105m49m-m (Normal 41-81 mL/sq-m) LV ESV: 82mL24mm (Normal 12-21 mL/sq-m) LV SV: 23mL/53m (Normal 26-56 mL/sq-m) CI: 2.4L/min/sq-m (Normal 1.8-3.8 L/min/sq-m) Right ventricle: Normal size with severe systolic dysfunction RV EF: 28% (Normal 47-80%) Absolute volumes: RV EDV: 173mL (64mal 58-154 mL) RV ESV: 124mL (N97ml 12-68 mL) RV SV: 49mL (No62m 35-98 mL) CO: 5.1L/min (Normal 2.7-6 L/min) Indexed volumes: RV EDV: 86mL/sq-m39mrmal 48-87 mL/sq-m) RV ESV: 62mL/sq-m 32mmal 11-28 mL/sq-m) RV SV: 24mL/sq-m (64mal 27-57 mL/sq-m) CI: 2.5L/min/sq-m (Normal 1.8-3.8 L/min/sq-m) Left atrium: Mild enlargement Right atrium: Normal size Mitral valve: Moderate regurgitation (regurgitant fraction 35%) Aortic valve: No regurgitation Tricuspid valve: Mild regurgitation Pulmonic valve: Trivial regurgitation Aorta: Normal proximal ascending aorta Pericardium: Moderate effusion, measuring up to 15mm adjacen65m right atrium IMPRESSION: 1. Mild LV dilatation, moderate hypertrophy, and severe systolic dysfunction (EF 22%) 2.  [id number] size with severe systolic dysfunction (EF 28%) 3. RV in0000000tion site late gadolinium enhancement, which is a nonspecific finding often seen in setting of elevated pulmonary pressures 4.  Moderate mitral regurgitation (regurgitant fraction 35%) 5. Moderate pericardial effusion, measuring up to 15mm  adjacen19m right atrium Electronically Signed   By: Christopher  SOswaldo Milian/04/2023 21:09   Medications:   Scheduled Medications:  Chlorhexidine Gluconate Cloth  6 each Topical Daily   empagliflozin  10 mg Oral Daily   enoxaparin (LOVENOX) injection  40 mg Subcutaneous Q24H   metoprolol succinate  50 mg Oral BID   sacubitril-valsartan  1 tablet Oral BID   sodium chloride flush  10-40 mL Intracatheter Q12H   spironolactone  25 mg Oral Daily   Infusions:  PRN Medications: acetaminophen, sodium chloride flush  Patient Profile  31 y/o woman w54h history of HTN (out of meds x 1 year) admitted acute systolic heart failure and hypertensive urgency.   Echo EF 20-25%. RV mod reduced. Severe MR Assessment/Plan  1. Acute Systolic Heart Failure Gestational hypertension with her first child 14 years ago. Remained hypertensive with 2 subsequent pregnancies. Stopped HTN medications ~ 1 year ago. Suspect hypertensive cardiomyopathy. TSH pending. Doubt ICM. Possible familial component Mom recently diagnosed with heart failure. HS Trop 110>232 BNP 618. Co-ox 79% on admit. - Echo EF 20-25% RV moderately reduced, IVC dilated  - Diuresed well with 40 IV lasix BID. CVP 5 today. Will d/c further diuresis for today, got 40 IV this am. Can transition to PO tomorrow.  - Now off NTG gtt. Continue oral GDMT titration  - Stop hydralazine  25 mg tid - Continue Entresto 49/51, never got higher dose originally ordered and patient got hypotensive. BP stable this am. Would continue 49/51 dose BID.  - Continue Spiro 25 mg daily  - Continue metoprolol 50 mg BID - Low suspicion for CAD so likely doesn't need cath.  - cMRI 2/12 with EF 22%, RVEF 28%, moderate MR, and moderate pericardial effusion  2. Hypertensive Urgency  - improving, weaning off NTG gtt Started on NTG drip.  - continue GDMT titration per above  - Renal US (-) - Check aldoseterone, renin, PRA (in process) - Needs sleep study once she  gets insurance.   3. Pericardial effusion  - Mild on echo 2/8 - Moderate on cMRI 2/12   4. Hypokalemia/ Hypomagnesemia  - K 2.8 on admit, 3.7 today. Mg 2.2 - continue spiro  - w/u to r/o hyperaldosteronism per above    5.  ID Anemia  - Fe 18, Ferritin 20, Tsat 5  - needs IV Fe. Will give feraheme today.   6. Social  - Uninsured and will need medications at the time of discharge from [place].   F/u scheduled in AHF clinic. Should be stable for discharge pending MD to see.   AHF meds at discharge: Jardiance 10 mg daily Metoprolol succinate 50 mg BID Entresto 49/51 mg BID Spironolactone 25 mg daily   Length of Stay: [place], NP  07/04/2022, 7:07 AM  Advanced Heart Failure Team Pager [phone] (M-F; 7a - 5p)  Please contact [facility] for night-coverage after hours (5p -7a ) and weekends on [email or web address]

## 2022-07-04 NOTE — Progress Notes (Signed)
Dizzy with mild hypotension last night so the dose of Entresto was appropriately decreased. Cardiac MRI shows severe biventricular dysfunction does not show extensive gadolinium enhancement, with hope for at least partial recovery of myocardial function over time, as long as her blood pressure is controlled. Agree with reducing the dose of Entresto to 49-51 mg twice daily, otherwise no change on previous discharge recommendations.

## 2022-07-04 NOTE — Plan of Care (Signed)
  Problem: Education: Goal: Knowledge of General Education information will improve Description: Including pain rating scale, medication(s)/side effects and non-pharmacologic comfort measures Outcome: Progressing   Problem: Health Behavior/Discharge Planning: Goal: Ability to manage health-related needs will improve Outcome: Progressing   Problem: Clinical Measurements: Goal: Ability to maintain clinical measurements within normal limits will improve Outcome: Progressing Goal: Will remain free from infection Outcome: Progressing Goal: Diagnostic test results will improve Outcome: Progressing Goal: Respiratory complications will improve Outcome: Progressing Goal: Cardiovascular complication will be avoided Outcome: Progressing   Problem: Activity: Goal: Risk for activity intolerance will decrease Outcome: Progressing   Problem: Coping: Goal: Level of anxiety will decrease Outcome: Progressing   Problem: Safety: Goal: Ability to remain free from injury will improve Outcome: Progressing   Problem: Education: Goal: Ability to demonstrate management of disease process will improve Outcome: Progressing Goal: Ability to verbalize understanding of medication therapies will improve Outcome: Progressing Goal: Individualized Educational Video(s) Outcome: Progressing   Problem: Activity: Goal: Capacity to carry out activities will improve Outcome: Progressing   Problem: Cardiac: Goal: Ability to achieve and maintain adequate cardiopulmonary perfusion will improve Outcome: Progressing

## 2022-07-04 NOTE — Discharge Instructions (Addendum)
Dear Ann Robbins,  It was a pleasure to take care of you during your stay at  [facility]  where you were treated for your Acute HFrEF (heart failure with reduced ejection fraction) ([facility]). I also sent a refill of your medicines to [facility] after your first month is done.   While you were here, you were:  observed and cared for by our nurses and nursing assistants  provided therapy by our physical therapy and occupational therapy staff  treated with medicines / procedures by your doctors  provided resources by our social workers and case managers  Please review the medication list provided to you at discharge and stop, start taking, or continue taking the medications listed there.  You should also follow-up with your primary care doctor, or start seeing one if you don't have one yet. If applicable, here are some scheduled follow-ups for you:  Follow-up Information     [facility] Follow up on 07/13/2022.   Specialty: Cardiology Why: Follow up in the [facility] at [facility] 07/13/22 at 0830 am  Entrance C, Free valet. Please bring all medications with you Contact information: [address] [id number] [place] [phone]               I recommend abstinence from alcohol, tobacco, and other illicit drug use.   If your symptoms recur, worsen, or if you have side effects to your medications, call your outpatient provider, 911, or go to the nearest emergency department.  Take care!  Camelia Phenes, MD [place] Physician 07/04/2022 8:47 AM

## 2022-07-05 ENCOUNTER — Telehealth (HOSPITAL_COMMUNITY): Payer: Self-pay | Admitting: Pharmacy Technician

## 2022-07-05 LAB — METANEPHRINES, URINE, 24 HOUR
Metaneph Total, Ur: 31 ug/L
Normetanephrine, Ur: 184 ug/L

## 2022-07-05 NOTE — Telephone Encounter (Signed)
Advanced Heart Failure Patient Advocate Encounter  Received [employer] application for Jardiance assistance. The income section was left blank. Called and left patient message to return my call. Cannot send in without income information.

## 2022-07-07 LAB — ALDOSTERONE + RENIN ACTIVITY W/ RATIO
ALDO / PRA Ratio: 0.7 (ref 0.0–30.0)
Aldosterone: 1.4 ng/dL (ref 0.0–30.0)
PRA LC/MS/MS: 2.092 ng/mL/hr (ref 0.167–5.380)

## 2022-07-12 NOTE — Progress Notes (Signed)
Advanced Heart Failure Clinic Note   Referring Physician: PCP: Renee Rival, FNP PCP-Cardiologist: Dr. Radford Pax  [facility]: Dr. Haroldine Laws   Reason for Visit: [facility] f/u for Systolic Heart Failure and Hypertension   HPI:  32 y/o woman with previous history of untreated HTN (out of meds x 1 year), recently admitted w/ new acute systolic heart failure and hypertensive urgency. Echo EF 20-25%. RV mod reduced. Severe MR. Renal artery doppler study and hyperaldo work up were both negative. She was diuresed w/ IV Lasix and started on antihypertensive and GDMT regimen w/ improvement in volume status, BP and symptoms. Cardiac MRI showed severe biventricular dysfunction (LVEF 22%, RVEF 28%), moderate MR, no extensive gadolinium enhancement. Etiology for HF felt most likely to be from uncontrolled HTN. Cardiac cath was not perused given young age, lack of ischemic symptoms and other risk factors. She was also found to have iron defiencey anemia and was treated w/ IV Fe. Discharged home on 2/13 w/ GDMT. D/c wt 188 lb.   She presents to the AHF team today for post hospital f/u. Reports feeling much better. Breathing much improved. No resting dyspnea, orthopnea/PND. No LEE. Wt stable since d/c, down ~182 lb at home. Energy level also much improved. No CP. Reports full med compliance. Tolerating well w/o side effects. No dizziness. BP 130/84.    Cardiac Studies    2D Echo 2/24 1. Left ventricular ejection fraction, by estimation, is 20 to 25%. The  left ventricle has severely decreased function. The left ventricle  demonstrates global hypokinesis. The left ventricular internal cavity size  was moderately dilated. Left  ventricular diastolic parameters are indeterminate. Elevated left atrial  pressure.   2. Right ventricular systolic function is moderately reduced. The right  ventricular size is normal. There is mildly elevated pulmonary artery  systolic pressure.   3. Left atrial size was  moderately dilated.   4. Right atrial size was mildly dilated.   5. A small pericardial effusion is present. The pericardial effusion is  localized near the right atrium, anterior to the right ventricle and  surrounding the apex.   6. The mitral valve is grossly normal. Severe mitral valve regurgitation.  No evidence of mitral stenosis.   7. The aortic valve is tricuspid. Aortic valve regurgitation is not  visualized. No aortic stenosis is present.   8. The inferior vena cava is dilated in size with <50% respiratory  variability, suggesting right atrial pressure of 15 mmHg.   Comparison(s): No prior Echocardiogram.     cMRI 2/24  Mild LV dilatation, moderate hypertrophy, and severe systolic dysfunction (EF 99991111)   2.  Normal RV size with severe systolic dysfunction (EF 0000000)   3. RV insertion site late gadolinium enhancement, which is a nonspecific finding often seen in setting of elevated pulmonary pressures   4.  Moderate mitral regurgitation (regurgitant fraction 35%)   5. Moderate pericardial effusion, measuring up to 87m adjacent to right atrium     Review of Systems: [y] = yes, [ ]$  = no   General: Weight gain [ ]$ ; Weight loss [ ]$ ; Anorexia [ ]$ ; Fatigue [ ]$ ; Fever [ ]$ ; Chills [ ]$ ; Weakness [ ]$   Cardiac: Chest pain/pressure [ ]$ ; Resting SOB [ ]$ ; Exertional SOB [ ]$ ; Orthopnea [ ]$ ; Pedal Edema [ ]$ ; Palpitations [ ]$ ; Syncope [ ]$ ; Presyncope [ ]$ ; Paroxysmal nocturnal dyspnea[ ]$   Pulmonary: Cough [ ]$ ; Wheezing[ ]$ ; Hemoptysis[ ]$ ; Sputum [ ]$ ; Snoring [ ]$   GI: Vomiting[ ]$ ; Dysphagia[ ]$ ;  Melena[ ]$ ; Hematochezia [ ]$ ; Heartburn[ ]$ ; Abdominal pain [ ]$ ; Constipation [ ]$ ; Diarrhea [ ]$ ; BRBPR [ ]$   GU: Hematuria[ ]$ ; Dysuria [ ]$ ; Nocturia[ ]$   Vascular: Pain in legs with walking [ ]$ ; Pain in feet with lying flat [ ]$ ; Non-healing sores [ ]$ ; Stroke [ ]$ ; TIA [ ]$ ; Slurred speech [ ]$ ;  Neuro: Headaches[ ]$ ; Vertigo[ ]$ ; Seizures[ ]$ ; Paresthesias[ ]$ ;Blurred vision [ ]$ ; Diplopia [ ]$ ; Vision changes  [ ]$   Ortho/Skin: Arthritis [ ]$ ; Joint pain [ ]$ ; Muscle pain [ ]$ ; Joint swelling [ ]$ ; Back Pain [ ]$ ; Rash [ ]$   Psych: Depression[ ]$ ; Anxiety[ ]$   Heme: Bleeding problems [ ]$ ; Clotting disorders [ ]$ ; Anemia [ ]$   Endocrine: Diabetes [ ]$ ; Thyroid dysfunction[ ]$    Past Medical History:  Diagnosis Date   Hypertension 2015   First with PIH and bp remained high after delivery    Current Outpatient Medications  Medication Sig Dispense Refill   acetaminophen (TYLENOL) 325 MG tablet Take 2 tablets (650 mg total) by mouth every 6 (six) hours as needed for moderate pain.     empagliflozin (JARDIANCE) 10 MG TABS tablet Take 1 tablet (10 mg total) by mouth daily. 30 tablet 0   metoprolol succinate (TOPROL-XL) 50 MG 24 hr tablet Take 1 tablet (50 mg total) by mouth 2 (two) times daily. Take with or immediately following a meal. 60 tablet 0   sacubitril-valsartan (ENTRESTO) 49-51 MG Take 1 tablet by mouth 2 (two) times daily. 60 tablet 0   spironolactone (ALDACTONE) 25 MG tablet Take 1 tablet (25 mg total) by mouth daily. 30 tablet 0   No current facility-administered medications for this encounter.    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  Tobacco Use   Smoking status: Never   Smokeless tobacco: Never  Substance and Sexual Activity   Alcohol use: No   Drug use: No   Sexual activity: Yes    Birth control/protection: None  Other Topics Concern   Not on file  Social History Narrative   Originally from [place] in [place] of [place]   Lives at home with boyfriend, Camila Li and their 2 children   Social Determinants of Health   Financial Resource Strain: Low Risk  (06/30/2022)   Overall Financial Resource Strain (CARDIA)    Difficulty of  Paying Living Expenses: Not hard at all  Food Insecurity: No Food Insecurity (06/29/2022)   Hunger Vital Sign    Worried About Running Out of Food in the Last Year: Never true    Ran Out of Food in the Last Year: Never true  Transportation Needs: No Transportation Needs (06/29/2022)   PRAPARE - Hydrologist (Medical): No    Lack of Transportation (Non-Medical): No  Physical Activity: Not on file  Stress: Not on file  Social Connections: Not on file  Intimate Partner Violence: Not At Risk (06/29/2022)   Humiliation, Afraid, Rape, and Kick questionnaire    Fear of Current or Ex-Partner: No    Emotionally Abused: No    Physically Abused: No    Sexually Abused: No      Family History  Problem Relation Age of Onset  Hypertension Mother     Vitals:   07/13/22 0811  BP: 130/84  Pulse: 91  SpO2: 100%  Weight: 84.6 kg (186 lb 6.4 oz)  Height: 5' 7"$  (1.702 m)     PHYSICAL EXAM: General:  Well appearing. No respiratory difficulty HEENT: normal Neck: supple. no JVD. Carotids 2+ bilat; no bruits. No lymphadenopathy or thyromegaly appreciated. Cor: PMI nondisplaced. Regular rate & rhythm. No rubs, gallops or murmurs. Lungs: clear Abdomen: soft, nontender, nondistended. No hepatosplenomegaly. No bruits or masses. Good bowel sounds. Extremities: no cyanosis, clubbing, rash, edema Neuro: alert & oriented x 3, cranial nerves grossly intact. moves all 4 extremities w/o difficulty. Affect pleasant.  ECG: not performed    ASSESSMENT & PLAN:  1. Chronic Systolic Heart Failure - New diagnosis, Echo 2/14 EF 20-25%. RV mod reduced. Severe MR - Etiology uncertain but suspect most likely hypertensive CM. LHC deferred given young age, minimal risk factors and lack of ischemic symptoms. cMRI showed severe biventricular dysfunction (LVEF 22%, RVEF 28%), moderate MR, no extensive gadolinium enhancement. No signs of infiltrative cardiomyopathy.  - Plan GDMT + BP control  and repeat 2D echo in 3 months. If EF not improving w/ improved BP will need R/LHC - NYHA Class II. Volume status stable. Euvolemic.  - Continue Jardiance 10 mg daily  - Increase Entresto to 97-103 mg bid - Continue Spironolactone 25 mg daily  - Continue Toprol XL 50 mg daily  - Plan Bidil next visit  - Will give Rx for PRN Lasix, 40 mg - Check BMP and BNP today  - We discussed daily wts, low sodium diet and fluid restriction  - Also advised avoidance of further pregnancies. She has had tubal ligation      2. Hypertension  H/o Gestational hypertension with her first child 14 years ago. Remained hypertensive with 2 subsequent pregnancies. Stopped HTN medications ~ 1 year ago. Admitted 2/24 w/ hypertensive emergency and HF. Renal artery doppler study and hyperaldo work up were both negative. BP improving w/ GDMT - GDMT titration per above - will arrange sleep study to r/o OSA once insurance is obtained    3.  ID Anemia  - suspect 2/2 menses  - Iron Studies 2/24: Fe 18, Ferritin 20, Tsat 5 >>treated w/ IV Fe  - will need to be followed by PCP   4. SDOH - currently uninsured. SW team assisting w/ medicaid application - samples of Entresto supplied to patient   F/u w/ PharmD in 2-3 wks and APP in 5-6 wks. Plan repeat echo and f/u w/ Dr. Haroldine Laws in 12 wks. If EF has normalized on echo, can graduate from the [facility] and continue further care w/ cardiology.    Lyda Jester, PA-C 07/13/22

## 2022-07-13 ENCOUNTER — Encounter (HOSPITAL_COMMUNITY): Payer: Self-pay

## 2022-07-13 ENCOUNTER — Other Ambulatory Visit (HOSPITAL_COMMUNITY): Payer: Self-pay

## 2022-07-13 ENCOUNTER — Telehealth (HOSPITAL_COMMUNITY): Payer: Self-pay

## 2022-07-13 ENCOUNTER — Ambulatory Visit (HOSPITAL_COMMUNITY)
Admit: 2022-07-13 | Discharge: 2022-07-13 | Disposition: A | Discharge status: Home / Self Care | Payer: Medicaid Other | Attending: Cardiology | Admitting: Cardiology

## 2022-07-13 VITALS — BP 130/84 | HR 91 | Ht 67.0 in | Wt 186.4 lb

## 2022-07-13 DIAGNOSIS — I160 Hypertensive urgency: Secondary | ICD-10-CM | POA: Insufficient documentation

## 2022-07-13 DIAGNOSIS — I5022 Chronic systolic (congestive) heart failure: Secondary | ICD-10-CM | POA: Insufficient documentation

## 2022-07-13 DIAGNOSIS — Z79899 Other long term (current) drug therapy: Secondary | ICD-10-CM | POA: Insufficient documentation

## 2022-07-13 DIAGNOSIS — D649 Anemia, unspecified: Secondary | ICD-10-CM | POA: Insufficient documentation

## 2022-07-13 DIAGNOSIS — I10 Essential (primary) hypertension: Secondary | ICD-10-CM

## 2022-07-13 DIAGNOSIS — I340 Nonrheumatic mitral (valve) insufficiency: Secondary | ICD-10-CM | POA: Insufficient documentation

## 2022-07-13 DIAGNOSIS — Z7984 Long term (current) use of oral hypoglycemic drugs: Secondary | ICD-10-CM | POA: Insufficient documentation

## 2022-07-13 DIAGNOSIS — Z8759 Personal history of other complications of pregnancy, childbirth and the puerperium: Secondary | ICD-10-CM | POA: Insufficient documentation

## 2022-07-13 DIAGNOSIS — I3139 Other pericardial effusion (noninflammatory): Secondary | ICD-10-CM | POA: Insufficient documentation

## 2022-07-13 DIAGNOSIS — I110 Hypertensive heart disease with heart failure: Secondary | ICD-10-CM | POA: Insufficient documentation

## 2022-07-13 LAB — BASIC METABOLIC PANEL
Anion gap: 7 (ref 5–15)
BUN: 14 mg/dL (ref 6–20)
CO2: 22 mmol/L (ref 22–32)
Calcium: 9.4 mg/dL (ref 8.9–10.3)
Chloride: 105 mmol/L (ref 98–111)
Creatinine, Ser: 1.27 mg/dL — ABNORMAL HIGH (ref 0.44–1.00)
GFR, Estimated: 58 mL/min — ABNORMAL LOW (ref >60)
Glucose, Bld: 78 mg/dL (ref 70–99)
Potassium: 3.7 mmol/L (ref 3.5–5.1)
Sodium: 134 mmol/L — ABNORMAL LOW (ref 135–145)

## 2022-07-13 LAB — BRAIN NATRIURETIC PEPTIDE: B Natriuretic Peptide: 29.2 pg/mL (ref 0.0–100.0)

## 2022-07-13 MED ORDER — FUROSEMIDE 40 MG PO TABS
40.0000 mg | ORAL_TABLET | ORAL | 2 refills | Status: DC | PRN
  Filled 2022-07-13: qty 30, 30d supply, fill #0

## 2022-07-13 MED ORDER — ENTRESTO 97-103 MG PO TABS: 1.0000 | ORAL_TABLET | Freq: Two times a day (BID) | ORAL | 11 refills | Status: DC

## 2022-07-13 MED ORDER — FUROSEMIDE 40 MG PO TABS
40.0000 mg | ORAL_TABLET | Freq: Every day | ORAL | 2 refills | Status: DC
  Filled 2022-07-13: qty 30, 30d supply, fill #0

## 2022-07-13 NOTE — Progress Notes (Signed)
Medication Samples have been provided to the patient.  Drug name: entresto       Strength: 49/51        Qty: 56  LOTKA:[id number] and [place].Chang  Exp.Date: 12/2023  Dosing instructions: two tabs twice a day  The patient has been instructed regarding the correct time, dose, and frequency of taking this medication, including desired effects and most common side effects.   Kerry Dory 8:53 AM 07/13/2022

## 2022-07-13 NOTE — Telephone Encounter (Signed)
Advanced Heart Failure Patient Advocate Encounter  Application for Jardiance faxed to [facility] on 07/13/22. Application form attached to patient chart.

## 2022-07-13 NOTE — Progress Notes (Signed)
H&V Care Navigation CSW Progress Note  Clinical Social Worker  met with pt to help finalize medication assistance and confirm what's going on with insurance.  Pt reports that she makes $42,494/year- this information provided to pharmacy team.  Also doesn't appear to have completed Entresto app so CSW assisted in completing this- she will plan to bring in w-2 to clinic this afternoon along with FMLA and STD paperwork.  Pt lives at home with her 3 children- she is not currently able to work and is concerned about paying for bills.  Pt is overdue on utilities and doesn't have the money to pay rent for March.  Will get one more paycheck from work but it will be less than normal- CSW informed pt of patient care fund to assist with expenses- states that she would like help with rent.  Pt reports no support to help with other expenses.  CSW suggested pt go to [facility] and [employer] [facility] for assistance as well  Pt will plan to come back today to provide CSW with w-2, FMLA and STD paperwork, and landlord info.  SDOH Screenings   Food Insecurity: No Food Insecurity (06/29/2022)  Housing: Low Risk  (06/29/2022)  Transportation Needs: No Transportation Needs (06/29/2022)  Utilities: Not At Risk (06/29/2022)  Depression (PHQ2-9): Low Risk  (12/14/2020)  Financial Resource Strain: Low Risk  (06/30/2022)  Tobacco Use: Low Risk  (07/13/2022)   Jorge Ny, LCSW Clinical Social Worker Advanced Heart Failure Clinic Desk#: [phone] Cell#: [phone]

## 2022-07-13 NOTE — Patient Instructions (Addendum)
Increase Entresto to 91/103 twice daily. May double the 49/51 and take two twice daily until you run out. New Rx should be correct dose and then take 1 tablet twice daily. New Rx sent; PharmD and Social working on assistance for same. May take Lasix 40 mg one tablet as needed for weight gain, fluid/swelling. New Rx sent to [facility] where we can use our Patient Fund.   [address]  [address]  [phone] 3.  Return to [facility] in 3 weeks. 4.  Return to Heart Failure APP Clinic in 6 weeks. 5.  Please call us at [phone] to schedule your 3 month f/u with Dr. Haroldine Laws in [facility] in April. 6.  Please call us at [phone] if any questions or concerns prior to your next visit.

## 2022-07-13 NOTE — Telephone Encounter (Signed)
Advanced Heart Failure Patient Advocate Encounter  Application for Entresto faxed to [employer] on 07/13/22. Application form attached to patient chart.  Clista Bernhardt, CPhT Rx Patient Advocate Phone: [phone]

## 2022-07-14 ENCOUNTER — Telehealth (HOSPITAL_COMMUNITY): Payer: Self-pay

## 2022-07-14 ENCOUNTER — Telehealth (HOSPITAL_COMMUNITY): Payer: Self-pay | Admitting: Licensed Clinical Social Worker

## 2022-07-14 NOTE — Telephone Encounter (Signed)
H&V Care Navigation CSW Progress Note  Clinical Social Worker received email from pt with proof of income- CSW sent to [employer] to complete [employer] assistance app.    Pt also provided CSW with landlord info for assistance with March rent- CSW called landlord and informed of necessary documentation for [place] to complete check request- awaiting response.   SDOH Screenings   Food Insecurity: No Food Insecurity (06/29/2022)  Housing: Low Risk  (06/29/2022)  Transportation Needs: No Transportation Needs (06/29/2022)  Utilities: Not At Risk (06/29/2022)  Depression (PHQ2-9): Low Risk  (12/14/2020)  Financial Resource Strain: Low Risk  (06/30/2022)  Tobacco Use: Low Risk  (07/13/2022)     Jorge Ny, LCSW Clinical Social Worker Advanced Heart Failure Clinic Desk#: [phone] Cell#: [phone]

## 2022-07-14 NOTE — Telephone Encounter (Signed)
Advanced Heart Failure Patient Advocate [employer] is requesting proof of income (POI) before making a determination

## 2022-07-14 NOTE — Telephone Encounter (Signed)
H&V Care Navigation CSW Progress Note  Clinical Social Worker submitted check request for rental assistance- awaiting accounts payable to process   [place]: No Food Insecurity (06/29/2022)  Housing: Low Risk  (06/29/2022)  Transportation Needs: No Transportation Needs (06/29/2022)  Utilities: Not At Risk (06/29/2022)  Depression (PHQ2-9): Low Risk  (12/14/2020)  Financial Resource Strain: Low Risk  (06/30/2022)  Tobacco Use: Low Risk  (07/13/2022)     Jorge Ny, LCSW Clinical Social Worker Advanced Heart Failure Clinic Desk#: [phone] Cell#: [phone]

## 2022-07-14 NOTE — Telephone Encounter (Signed)
Advanced Heart Failure Patient Advocate Encounter  Patient was approved to receive Jardiance from [facility] Effective 07/14/22 to 07/14/23  Clista Bernhardt, CPhT Rx Patient Advocate Phone: [phone]

## 2022-07-14 NOTE — Telephone Encounter (Signed)
Pt was given note for work for hospitalization -clarification needed for letter and dates    -no answer when reaching out to pt, unable to leave VM

## 2022-07-14 NOTE — Telephone Encounter (Signed)
Patient was seen yesterday and would like to have a note excusing her from work for the last several weeks since she has been in the hospital. Is this ok to set up?

## 2022-07-18 ENCOUNTER — Inpatient Hospital Stay: Payer: Self-pay | Admitting: Nurse Practitioner

## 2022-07-18 NOTE — Telephone Encounter (Signed)
Pt is needing a return to work letter Advised this is best discussed at an OV Pt has a follow up on 3/7, can discuss return to work and or restrictions if needed at that time  Pt voiced understanding and appreciative of return cal

## 2022-07-19 ENCOUNTER — Telehealth (HOSPITAL_COMMUNITY): Payer: Self-pay

## 2022-07-19 NOTE — Telephone Encounter (Addendum)
  Paper work faxed on 07/25/22 to [place] and for [place], my chcart message sent to patient to inform her of the paper work completion   Awaiting MD signature  06/29/22

## 2022-07-20 ENCOUNTER — Ambulatory Visit (INDEPENDENT_AMBULATORY_CARE_PROVIDER_SITE_OTHER): Payer: Self-pay | Admitting: Family Medicine

## 2022-07-20 ENCOUNTER — Encounter: Payer: Self-pay | Admitting: Family Medicine

## 2022-07-20 VITALS — BP 108/68 | HR 69 | Ht 67.0 in | Wt 188.8 lb

## 2022-07-20 DIAGNOSIS — D509 Iron deficiency anemia, unspecified: Secondary | ICD-10-CM

## 2022-07-20 DIAGNOSIS — I5020 Unspecified systolic (congestive) heart failure: Secondary | ICD-10-CM

## 2022-07-20 DIAGNOSIS — I10 Essential (primary) hypertension: Secondary | ICD-10-CM

## 2022-07-20 NOTE — Telephone Encounter (Signed)
Advanced Heart Failure Patient Advocate Encounter  POI faxed to [employer] on 07/20/22

## 2022-07-20 NOTE — Progress Notes (Signed)
   Subjective:   Patient ID: Ann Robbins, female    DOB: 19-May-1991, 32 y.o.   MRN: [id number]   CC: Establish care, hospital f/u  HPI:  Ann Robbins is a very pleasant 32 y.o. female who presents today to establish care. Also being seen for hospital follow-up: recently admitted 2/8-2/13 for new onset HFrEF and hypertensive emergency. Doing well since discharge, has followed-up with cardiology already.  Per DC summary, PCP f/u items: Needs sleep study, f/u pending lab results (renin/aldo, 5-HIAA, urine cortisol, and catecholamines)  Past medical history: HTN, HFrEF (recent diagnosis)  Current medications: Jardiance '10mg'$  daily Metoprolol '50mg'$  BID Entresto 49-'51mg'$  BID Spironolactone '25mg'$  daily Lasix '40mg'$  prn (taking once on Sundays cuz she's unsure when she needs it). Weight 182-183lbs on home scale.   Past surgical history: cholecystectomy, BTL  Family history: Mom with HTN and CHF  Social history: lives with her 3 children and her boyfriend. Works at a childcare center. No alcohol, tobacco, or recreational drug use.  Objective:  BP 108/68   Pulse 69   Ht '5\' 7"'$  (1.702 m)   Wt 188 lb 12.8 oz (85.6 kg)   LMP 07/12/2022   SpO2 99%   BMI 29.57 kg/m   Vitals and nursing note reviewed  General: NAD, pleasant, able to participate in exam HEENT: normal sclera and conjunctiva, TM normal bilaterally, oropharynx unremarkable Neck: thyroid smooth and normal in size, no cervical or supraclavicular lymphadenopathy Cardiac: RRR, S1 S2 present. normal heart sounds, no murmurs Respiratory: CTAB, normal effort, No wheezes, rales or rhonchi Abdomen: soft, non-tender, non-distended Extremities: no edema or cyanosis Skin: warm and dry, no rashes noted Neuro: alert, no obvious focal deficits Psych: Normal affect and mood   Assessment & Plan:   Essential hypertension BP well-controlled. Continue current medications. Workup negative for secondary causes. Once patient's Medicaid is  approved, we will obtain sleep study.  Anemia Most recent Hgb 10.1 with ferritin 20, iron 18, and saturation ratio of 5. S/p IV iron during recent admission. Plan to start PO iron supplements once patient's Medicaid is approved and then recheck iron studies in ~3 months.  HFrEF (heart failure with reduced ejection fraction) (HCC) Tolerating GDMT. Weight stable ("dry weight" ~188lbs), appears euvolemic without evidence of exacerbation. Lasix is prescribed prn but patient was unsure when to take-- given parameters of >3lbs in 24 hrs or >5lbs in 1 week. F/u with cardiology in ~1 month as planned.  Health Maintenance Due for Pap. Patient to schedule f/u appointment once insurance is approved. Given blue advanced directives packet today.  Alcus Dad, MD [place] PGY-3

## 2022-07-20 NOTE — Patient Instructions (Addendum)
It was great to see you!  Things we discussed today: -Continue your current medications -Take the lasix as needed for weight gain >3 lbs in 24 hrs or >5lbs in a week, or if you feel fluid overloaded -Let me know when your medicaid is approved and I can order the sleep study -Return the blue advanced directives packet at your earliest convenience (we will notarize/sign here)  -Follow-up with me once your Medicaid is approved and we will discuss your anemia/iron and we will do your pap smear  Take care, Dr Rock Nephew

## 2022-07-21 ENCOUNTER — Encounter: Payer: Self-pay | Admitting: Family Medicine

## 2022-07-21 NOTE — Assessment & Plan Note (Signed)
BP well-controlled. Continue current medications. Workup negative for secondary causes. Once patient's Medicaid is approved, we will obtain sleep study.

## 2022-07-21 NOTE — Assessment & Plan Note (Signed)
Most recent Hgb 10.1 with ferritin 20, iron 18, and saturation ratio of 5. S/p IV iron during recent admission. Plan to start PO iron supplements once patient's Medicaid is approved and then recheck iron studies in ~3 months.

## 2022-07-21 NOTE — Assessment & Plan Note (Addendum)
Tolerating GDMT. Weight stable ("dry weight" ~188lbs), appears euvolemic without evidence of exacerbation. Lasix is prescribed prn but patient was unsure when to take-- given parameters of >3lbs in 24 hrs or >5lbs in 1 week. F/u with cardiology in ~1 month as planned.

## 2022-07-24 NOTE — Telephone Encounter (Signed)
Advanced Heart Failure Patient Advocate Encounter  Patient was approved to receive Entresto from [employer] Effective 07/21/22 to 07/21/23

## 2022-08-08 NOTE — Progress Notes (Incomplete)
***In Progress***    Advanced Heart Failure Clinic Note   PCP: Renee Rival, FNP PCP-Cardiologist: Dr. Radford Pax  [facility]: Dr. Haroldine Laws   HPI:  32 y/o woman with previous history of untreated HTN (out of meds x 1 year), admitted 06/29/22 w/ new acute systolic heart failure and hypertensive urgency. 06/2022 Echo EF 20-25%, RV mod reduced,  severe MR. Renal artery doppler study and hyperaldo work up were both negative. She was diuresed w/ IV Lasix and started on antihypertensive and GDMT regimen w/ improvement in volume status, BP and symptoms. 06/2022 Cardiac MRI showed severe biventricular dysfunction (LVEF 22%, RVEF 28%), moderate MR, no extensive gadolinium enhancement. Etiology for HF felt most likely to be from uncontrolled HTN. Cardiac cath was not perused given young age, lack of ischemic symptoms and other risk factors. She was also found to have iron defiencey anemia and was treated w/ IV Fe. Discharged home on 07/04/22 w/ GDMT. D/c wt 188 lb.    She presented to the AHF team on 07/13/22 for post hospital f/u. Reported feeling much better. Breathing much improved. No resting dyspnea, orthopnea/PND. No LEE. Wt stable since d/c, down ~182 lb at home. Energy level also much improved. No CP. Reported full med compliance. Tolerating well w/o side effects. No dizziness. BP 130/84.  Today she returns to HF clinic for pharmacist medication titration. At last visit with APP, Entresto was increased to 97/103 mg BID.   Overall feeling ***. Dizziness, lightheadedness, fatigue:  Chest pain or palpitations:  How is your breathing?: *** SOB: Able to complete all ADLs. Activity level ***  Weight at home pounds. Takes furosemide/torsemide/bumex *** mg *** daily.  LEE PND/Orthopnea  Appetite *** Low-salt diet:   Physical Exam Cost/affordability of meds   HF Medications: Metoprolol succinate 50 mg BID  Entresto 97/103 mg BID  Spironolactone 25 mg daily  Jardiance 10 mg daily  Lasix 40 mg PRN    Has the patient been experiencing any side effects to the medications prescribed?  {YES NO:22349}  Does the patient have any problems obtaining medications due to transportation or finances?   Uninsured, Medicaid pending. Entresto from [employer] of regimen: {excellent/good/fair/poor:19665} Understanding of indications: {excellent/good/fair/poor:19665} Potential of compliance: {excellent/good/fair/poor:19665} Patient understands to avoid NSAIDs. Patient understands to avoid decongestants.    Pertinent Lab Values: 07/13/22: Serum creatinine 1.27, BUN 14, Potassium 3.7, Sodium 134, BNP 29.2  Vital Signs: Weight: *** (last clinic weight: 186 lbs) Blood pressure: ***  Heart rate: ***   Assessment/Plan: 1. Chronic Systolic Heart Failure - New diagnosis, Echo 06/2022 EF 20-25%. RV mod reduced. Severe MR - Etiology uncertain but suspect most likely hypertensive CM. LHC deferred given young age, minimal risk factors and lack of ischemic symptoms. cMRI showed severe biventricular dysfunction (LVEF 22%, RVEF 28%), moderate MR, no extensive gadolinium enhancement. No signs of infiltrative cardiomyopathy.  - Plan GDMT + BP control and repeat 2D echo in 3 months. If EF not improving w/ improved BP will need R/LHC - NYHA Class II. Volume status stable. Euvolemic.  - continue Lasix 40 mg PRN *** - Continue metoprolol succinate 50 mg daily  - Continue Entresto to 97-103 mg BID *** - Continue Spironolactone 25 mg daily  - Continue Jardiance 10 mg daily *** - Plan Bidil next visit ***  - Previously discussed daily wts, low sodium diet and fluid restriction.  - APP advised avoidance of further pregnancies. She has had tubal ligation.     2. Hypertension  H/o Gestational hypertension  with her first child 14 years ago. Remained hypertensive with 2 subsequent pregnancies. Stopped HTN medications ~ 1 year ago. Admitted 06/2022 w/ hypertensive emergency and HF. Renal artery doppler study  and hyperaldo work up were both negative. BP improving w/ GDMT - GDMT titration per above - Will arrange sleep study to r/o OSA once insurance is obtained      3.  ID Anemia  - suspect 2/2 menses  - Iron Studies 06/2022: Fe 18, Ferritin 20, Tsat 5 >>treated w/ IV Fe  - Will need to be followed by PCP    4. SDOH - Currently uninsured. SW team assisting w/ medicaid application - Samples of Entresto supplied to patient on 07/13/22.  Follow up with APP on 08/25/22.  Sinda Du, PharmD Candidate  Audry Riles, PharmD, BCPS, BCCP, CPP Heart Failure Clinic Pharmacist [phone]

## 2022-08-09 ENCOUNTER — Ambulatory Visit (HOSPITAL_COMMUNITY)
Admission: RE | Admit: 2022-08-09 | Discharge: 2022-08-09 | Disposition: A | Discharge status: Home / Self Care | Payer: Medicaid Other | Source: Ambulatory Visit | Attending: Cardiology | Admitting: Cardiology

## 2022-08-09 VITALS — BP 140/92 | HR 65 | Wt 187.8 lb

## 2022-08-09 DIAGNOSIS — I5022 Chronic systolic (congestive) heart failure: Secondary | ICD-10-CM | POA: Insufficient documentation

## 2022-08-09 DIAGNOSIS — D509 Iron deficiency anemia, unspecified: Secondary | ICD-10-CM | POA: Insufficient documentation

## 2022-08-09 DIAGNOSIS — I110 Hypertensive heart disease with heart failure: Secondary | ICD-10-CM | POA: Diagnosis not present

## 2022-08-09 DIAGNOSIS — I5020 Unspecified systolic (congestive) heart failure: Secondary | ICD-10-CM

## 2022-08-09 LAB — BASIC METABOLIC PANEL
Anion gap: 8 (ref 5–15)
BUN: 6 mg/dL (ref 6–20)
CO2: 26 mmol/L (ref 22–32)
Calcium: 8.9 mg/dL (ref 8.9–10.3)
Chloride: 104 mmol/L (ref 98–111)
Creatinine, Ser: 1 mg/dL (ref 0.44–1.00)
GFR, Estimated: >60 mL/min (ref >60)
Glucose, Bld: 86 mg/dL (ref 70–99)
Potassium: 3.5 mmol/L (ref 3.5–5.1)
Sodium: 138 mmol/L (ref 135–145)

## 2022-08-09 MED ORDER — ENTRESTO 97-103 MG PO TABS: 1.0000 | ORAL_TABLET | Freq: Two times a day (BID) | ORAL | 3 refills | Status: DC

## 2022-08-09 MED ORDER — FUROSEMIDE 40 MG PO TABS: 40.0000 mg | ORAL_TABLET | ORAL | 11 refills | Status: DC | PRN

## 2022-08-09 MED ORDER — METOPROLOL SUCCINATE ER 50 MG PO TB24: 50.0000 mg | ORAL_TABLET | Freq: Two times a day (BID) | ORAL | 11 refills | Status: DC

## 2022-08-09 MED ORDER — ISOSORBIDE MONONITRATE ER 30 MG PO TB24: 30.0000 mg | ORAL_TABLET | Freq: Every day | ORAL | 11 refills | Status: DC

## 2022-08-09 MED ORDER — SPIRONOLACTONE 25 MG PO TABS: 25.0000 mg | ORAL_TABLET | Freq: Every day | ORAL | 11 refills | Status: DC

## 2022-08-09 MED ORDER — HYDRALAZINE HCL 25 MG PO TABS: 25.0000 mg | ORAL_TABLET | Freq: Three times a day (TID) | ORAL | 11 refills | Status: DC

## 2022-08-09 NOTE — Patient Instructions (Addendum)
It was a pleasure seeing you today!  MEDICATIONS: -We are changing your medications today -Start hydralazine 25 mg (1 tablet) three times daily -Start isosorbide mononitrate 30 mg (1 tablet) daily You can try Biotin and CoQ10 to help with hair loss.  -Call if you have questions about your medications.  LABS: -We will call you if your labs need attention.  NEXT APPOINTMENT: Return to clinic in 2 weeks with APP Clinic.  In general, to take care of your heart failure: -Limit your fluid intake to 2 Liters (half-gallon) per day.   -Limit your salt intake to ideally 2-3 grams (2000-3000 mg) per day. -Weigh yourself daily and record, and bring that "weight diary" to your next appointment.  (Weight gain of 2-3 pounds in 1 day typically means fluid weight.) -The medications for your heart are to help your heart and help you live longer.   -Please contact us before stopping any of your heart medications.  Call the clinic at [phone] with questions or to reschedule future appointments.

## 2022-08-09 NOTE — Progress Notes (Signed)
Advanced Heart Failure Clinic Note   PCP: Renee Rival, FNP PCP-Cardiologist: Dr. Radford Pax  [facility]: Dr. Haroldine Laws   HPI:  32 y/o woman with previous history of untreated HTN (out of meds x 1 year), admitted 06/29/22 w/ new acute systolic heart failure and hypertensive urgency. 06/2022 Echo EF 20-25%, RV mod reduced,  severe MR. Renal artery doppler study and hyperaldo work up were both negative. She was diuresed w/ IV Lasix and started on antihypertensive and GDMT regimen w/ improvement in volume status, BP and symptoms. 06/2022 Cardiac MRI showed severe biventricular dysfunction (LVEF 22%, RVEF 28%), moderate MR, no extensive gadolinium enhancement. Etiology for HF felt most likely to be from uncontrolled HTN. Cardiac cath was not pursued given young age, lack of ischemic symptoms and other risk factors. She was also found to have iron deficiency anemia and was treated w/ IV Fe. Discharged home on 07/04/22 w/ GDMT. D/c wt 188 lb.    She presented to the AHF team on 07/13/22 for post hospital f/u. Reported feeling much better. Breathing much improved. No resting dyspnea, orthopnea/PND. No LEE. Wt stable since d/c, down to ~182 lb at home. Energy level also much improved. No CP. Reported full med compliance. Tolerating well w/o side effects. No dizziness. BP 130/84.  Today she returns to HF clinic for pharmacist medication titration. At last visit with APP, Entresto was increased to 97/103 mg BID. Overall feeling good, has a cold from weather changes. Does endorse orthostasis about 2-3 times per day when standing up too quickly, but this resolves by sitting down for a moment. Reports that orthostasis does not affect ADLs. Denies fatigue. Denies chest pain and palpitations. Does not take BP at home and does not have cuff. Endorses normal breathing. Able to walk on flat surface for 5-10 minutes without SOB. Denies SOB when walking up stairs or inclines. Denies SOB when walking, jogging, and playing with kids  at work. Able to complete all ADLs. Weight at home ~185 pounds. Takes Lasix 40 mg every Sunday as she was unsure of how to use PRN. No LEE on exam today. Denies PND/Orthopnea. Appetite has been good and is following a low-salt diet, including utilizing Ms. Dash for seasoning. She has received her first shipment of both [place] and Entresto from manufacturers. She does endorse new hair shedding/loss. This started when she came home from the hospital and noticed hair was coming out in clumps when washing. It has improved since then, but is still occurring.   HF Medications: Metoprolol succinate 50 mg BID  Entresto 97/103 mg BID  Spironolactone 25 mg daily  Jardiance 10 mg daily  Lasix 40 mg PRN -- currently taking 40 mg every Sunday  Has the patient been experiencing any side effects to the medications prescribed? Is reporting some new hair shedding/loss which may be related to metoprolol succinate -- continue to monitor. She will try Biotin.  Does the patient have any problems obtaining medications due to transportation or finances? Uninsured, Medicaid pending. Entresto from [employer], [place] from [employer] of regimen: good Understanding of indications: good Potential of compliance: good Patient understands to avoid NSAIDs. Patient understands to avoid decongestants.   Pertinent Lab Values: 07/13/22: Serum creatinine 1.27, BUN 14, Potassium 3.7, Sodium 134, BNP 29.2  Vital Signs: Weight: 187.8 lbs (last clinic weight: 186 lbs) Blood pressure: 140/92 mm Hg  Heart rate: 65 BPM  Assessment/Plan: 1. Chronic Systolic Heart Failure - New diagnosis, Echo 06/2022 EF 20-25%. RV mod reduced. Severe  MR - Etiology uncertain but suspect most likely hypertensive CM. LHC deferred given young age, minimal risk factors and lack of ischemic symptoms. cMRI showed severe biventricular dysfunction (LVEF 22%, RVEF 28%), moderate MR, no extensive gadolinium enhancement. No signs of  infiltrative cardiomyopathy.  - Plan GDMT + BP control and repeat 2D echo in 3 months. If EF not improving w/ improved BP will need R/LHC. - NYHA Class II. Volume status stable. Euvolemic on exam today.  - Obtain BMET today.  - Continue Lasix 40 mg PRN. Educated on how to utilize appropriately. - Continue metoprolol succinate 50 mg BID  - Continue Entresto 97/103 mg BID  - Continue Spironolactone 25 mg daily  - Continue Jardiance 10 mg daily  - Initiate isosorbide mononitrate 30 mg daily + hydralazine 25 mg TID.  - Encouraged to obtain BP cuff and monitor BP daily until follow-up. - Encouraged to try Biotin supplement to help with hair loss. Re-assess at follow-up.  - Previously discussed daily wts, low sodium diet and fluid restriction.  - APP advised avoidance of further pregnancies. She has had tubal ligation.   2. Hypertension  H/o Gestational hypertension with her first child 14 years ago. Remained hypertensive with 2 subsequent pregnancies. Stopped HTN medications ~ 1 year ago. Admitted 06/2022 w/ hypertensive emergency and HF. Renal artery doppler study and hyperaldo work up were both negative.  - GDMT titration per above - Will arrange sleep study to r/o OSA once insurance is obtained    3.  ID Anemia  - suspect 2/2 menses  - Iron Studies 06/2022: Fe 18, Ferritin 20, Tsat 5 >>treated w/ IV Fe  - Will need to be followed by PCP    4. SDOH - Currently uninsured. SW team assisting w/ Medicaid application. - Obtaining Jardiance from [facility] and Entresto from [employer]  Follow up with APP on 08/25/22.  Sinda Du, PharmD Candidate  Audry Riles, PharmD, BCPS, BCCP, CPP Heart Failure Clinic Pharmacist [phone]

## 2022-08-23 NOTE — Progress Notes (Signed)
Advanced Heart Failure Clinic Note   PCP: Maury Dus, MD Primary Cardiologist: Dr. Mayford Knife  AHF Cardiologist: Dr. Gala Romney   Reason for Visit: Follow up for Systolic Heart Failure and Hypertension   HPI: 32 y.o.woman with previous history of untreated HTN (out of meds x 1 year), recently admitted w/ new acute systolic heart failure and hypertensive urgency. Echo EF 20-25%. RV mod reduced, severe MR. Renal artery doppler study and hyperaldo work up were both negative. She was diuresed w/ IV Lasix and started on GDMT.Cardiac MRI showed severe biventricular dysfunction (LVEF 22%, RVEF 28%), moderate MR, no extensive gadolinium enhancement. Etiology for HF felt most likely to be from uncontrolled HTN. Cardiac cath was not perused given young age, lack of ischemic symptoms and other risk factors. She was also found to have iron defiencey anemia and was treated w/ IV Fe. Discharged home, wt 188 lb.   Today she returns for HF follow up. Overall feeling fine. She is not SOB with activity or work duties. 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 Denies palpitations, CP, dizziness, palpitations, edema, or PND/Orthopnea. Appetite ok. No fever or chills. Weight at home 184 pounds. Taking all medications. She has 3 children (5, 9, 14 year-olds). Ordered BP cuff, waiting for delivery. Had HA yesterday improved after taking Tylenol.   Cardiac Studies  - Echo (2/24): EF 20-25%, RV moderately reduced, severe MR  - cMRI (2/24): LVEF 22%, RVEF 28%, LGE in RV insertion site suggestive of elevated pulmonary pressures, moderate MR (regurgitant fraction 35%), moderate pericardial effusions  Past Medical History:  Diagnosis Date   HFrEF (heart failure with reduced ejection fraction)    Hypertension 2015   First with PIH and bp remained high after delivery   Current Outpatient Medications  Medication Sig Dispense Refill   acetaminophen (TYLENOL) 325 MG tablet Take 2 tablets (650 mg total)  by mouth every 6 (six) hours as needed for moderate pain.     empagliflozin (JARDIANCE) 10 MG TABS tablet Take 1 tablet (10 mg total) by mouth daily. 30 tablet 0   furosemide (LASIX) 40 MG tablet Take 1 tablet (40 mg total) by mouth as needed. 30 tablet 11   hydrALAZINE (APRESOLINE) 25 MG tablet Take 1 tablet (25 mg total) by mouth 3 (three) times daily. 90 tablet 11   isosorbide mononitrate (IMDUR) 30 MG 24 hr tablet Take 1 tablet (30 mg total) by mouth daily. 30 tablet 11   metoprolol succinate (TOPROL-XL) 50 MG 24 hr tablet Take 1 tablet (50 mg total) by mouth 2 (two) times daily. Take with or immediately following a meal. 60 tablet 11   sacubitril-valsartan (ENTRESTO) 97-103 MG Take 1 tablet by mouth 2 (two) times daily. 180 tablet 3   spironolactone (ALDACTONE) 25 MG tablet Take 1 tablet (25 mg total) by mouth daily. 30 tablet 11   No current facility-administered medications for this encounter.   No Known Allergies  Social History   Socioeconomic History   Marital status: Single    Spouse name: Nicanor Alcon   Number of children: 2   Years of education: college-some   Highest education level: Not on file  Occupational History   Occupation: [employer]  Tobacco Use   Smoking status: Never   Smokeless tobacco: Never  Substance and Sexual Activity   Alcohol use: No   Drug use: No   Sexual activity: Yes    Birth control/protection: Surgical  Other Topics Concern   Not on  file  Social History Narrative   [place]   Moved [place] in March of 2017   Lives at home with boyfriend, Jena GaussHugh and their 2 children   Social Determinants of Health   Financial Resource Strain: Low Risk  (06/30/2022)   Overall Financial Resource Strain (CARDIA)    Difficulty of Paying Living Expenses: Not hard at all  Food Insecurity: No Food Insecurity (06/29/2022)   Hunger Vital Sign    Worried About Running Out of Food in the Last Year: Never true    Ran Out of Food  in the Last Year: Never true  Transportation Needs: No Transportation Needs (06/29/2022)   PRAPARE - Administrator, Civil ServiceTransportation    Lack of Transportation (Medical): No    Lack of Transportation (Non-Medical): No  Physical Activity: Not on file  Stress: Not on file  Social Connections: Not on file  Intimate Partner Violence: Not At Risk (06/29/2022)   Humiliation, Afraid, Rape, and Kick questionnaire    Fear of Current or Ex-Partner: No    Emotionally Abused: No    Physically Abused: No    Sexually Abused: No   Family History  Problem Relation Age of Onset   Heart failure Mother    Hypertension Mother    Wt Readings from Last 3 Encounters:  08/25/22 87.1 kg (192 lb)  08/09/22 85.2 kg (187 lb 12.8 oz)  07/20/22 85.6 kg (188 lb 12.8 oz)   BP (!) 132/90   Pulse 76   Wt 87.1 kg (192 lb)   SpO2 98%   BMI 30.07 kg/m   PHYSICAL EXAM: General:  NAD. No resp difficulty, walked into clinic HEENT: Normal Neck: Supple. No JVD. Carotids 2+ bilat; no bruits. No lymphadenopathy or thryomegaly appreciated. Cor: PMI nondisplaced. Regular rate & rhythm. No rubs, gallops or murmurs. Lungs: Clear Abdomen: Soft, nontender, nondistended. No hepatosplenomegaly. No bruits or masses. Good bowel sounds. Extremities: No cyanosis, clubbing, rash, edema Neuro: Alert & oriented x 3, cranial nerves grossly intact. Moves all 4 extremities w/o difficulty. Affect pleasant.  ASSESSMENT & PLAN: 1. Chronic Systolic Heart Failure - New diagnosis, Echo 2/14 EF 20-25%. RV mod reduced. Severe MR - Etiology uncertain but suspect most likely hypertensive CM. LHC deferred given young age, minimal risk factors and lack of ischemic symptoms. cMRI showed severe biventricular dysfunction (LVEF 22%, RVEF 28%), moderate MR, no extensive gadolinium enhancement. No signs of infiltrative cardiomyopathy.  - Plan GDMT + BP control and repeat 2D echo in 3 months. If EF not improving w/ improved BP will need R/LHC - NYHA I- II. Volume status  stable.  - Increase hydralazine to 37.5 mg tid. - Continue Imdur 30 mg daily. - Continue Jardiance 10 mg daily  - Continue Entresto 97-103 mg bid - Continue spironolactone 25 mg daily  - Continue Toprol XL 50 mg bid - She has PRN Lasix at home. - Also advised avoidance of further pregnancies. She has had tubal ligation  - Labs today - Repeat echo next visit.   2. HTN - H/o Gestational hypertension with her first child 14 years ago. Remained hypertensive with 2 subsequent pregnancies.  - Stopped HTN medications ~ 1 year ago. Admitted 2/24 w/ hypertensive emergency and HF. Renal artery doppler study and hyperaldo work up were both negative.  - BP improving w/ GDMT - Increase hydral as above. - PCP arranging sleep study now that she has insurance.  3.  ID Anemia  - suspect 2/2 menses  - Iron Studies 2/24: Fe 18,  Ferritin 20, Tsat 5 >>treated w/ IV Fe  - Followed by PCP , now on oral FE  4. SDOH - Now has Medicaid.  Follow up in 2 months with Dr. Gala RomneyBensimhon + echo. If EF has normalized, [facility] and continue further care w/ cardiology.   Anderson MaltaJessica M [place], FNP 08/25/22

## 2022-08-25 ENCOUNTER — Other Ambulatory Visit (HOSPITAL_COMMUNITY): Payer: Self-pay

## 2022-08-25 ENCOUNTER — Ambulatory Visit (HOSPITAL_COMMUNITY)
Admission: RE | Admit: 2022-08-25 | Discharge: 2022-08-25 | Disposition: A | Discharge status: Home / Self Care | Payer: Medicaid Other | Source: Ambulatory Visit | Attending: Family Medicine | Admitting: Family Medicine

## 2022-08-25 ENCOUNTER — Telehealth (HOSPITAL_COMMUNITY): Payer: Self-pay

## 2022-08-25 ENCOUNTER — Encounter (HOSPITAL_COMMUNITY): Payer: Self-pay

## 2022-08-25 VITALS — BP 132/90 | HR 76 | Wt 192.0 lb

## 2022-08-25 DIAGNOSIS — Z79899 Other long term (current) drug therapy: Secondary | ICD-10-CM | POA: Insufficient documentation

## 2022-08-25 DIAGNOSIS — Z8249 Family history of ischemic heart disease and other diseases of the circulatory system: Secondary | ICD-10-CM | POA: Insufficient documentation

## 2022-08-25 DIAGNOSIS — D509 Iron deficiency anemia, unspecified: Secondary | ICD-10-CM

## 2022-08-25 DIAGNOSIS — I110 Hypertensive heart disease with heart failure: Secondary | ICD-10-CM | POA: Diagnosis not present

## 2022-08-25 DIAGNOSIS — I5022 Chronic systolic (congestive) heart failure: Secondary | ICD-10-CM | POA: Diagnosis present

## 2022-08-25 DIAGNOSIS — D649 Anemia, unspecified: Secondary | ICD-10-CM | POA: Insufficient documentation

## 2022-08-25 DIAGNOSIS — I10 Essential (primary) hypertension: Secondary | ICD-10-CM | POA: Diagnosis not present

## 2022-08-25 DIAGNOSIS — Z139 Encounter for screening, unspecified: Secondary | ICD-10-CM

## 2022-08-25 LAB — BASIC METABOLIC PANEL
Anion gap: 10 (ref 5–15)
BUN: 14 mg/dL (ref 6–20)
CO2: 24 mmol/L (ref 22–32)
Calcium: 9.1 mg/dL (ref 8.9–10.3)
Chloride: 105 mmol/L (ref 98–111)
Creatinine, Ser: 1.16 mg/dL — ABNORMAL HIGH (ref 0.44–1.00)
GFR, Estimated: >60 mL/min (ref >60)
Glucose, Bld: 83 mg/dL (ref 70–99)
Potassium: 3.3 mmol/L — ABNORMAL LOW (ref 3.5–5.1)
Sodium: 139 mmol/L (ref 135–145)

## 2022-08-25 LAB — BRAIN NATRIURETIC PEPTIDE: B Natriuretic Peptide: 17.1 pg/mL (ref 0.0–100.0)

## 2022-08-25 MED ORDER — POTASSIUM CHLORIDE CRYS ER 20 MEQ PO TBCR: 20.0000 meq | EXTENDED_RELEASE_TABLET | Freq: Every day | ORAL | 3 refills | Status: DC

## 2022-08-25 MED ORDER — HYDRALAZINE HCL 25 MG PO TABS: 37.5000 mg | ORAL_TABLET | Freq: Three times a day (TID) | ORAL | 8 refills | Status: DC

## 2022-08-25 NOTE — Patient Instructions (Addendum)
Thank you for coming in today  Labs were done today, if any labs are abnormal the clinic will call you No news is good news  Medications: Increase Hydralazine to 37.5 mg 3 times    Follow up appointments:  Your physician recommends that you schedule a follow-up appointment in:  2 months with Dr. Gala Romney with echocardiogram  You will receive a reminder letter in the mail a few months in advance. If you don't receive a letter, please call our office to schedule the follow-up appointment.  Your physician has requested that you have an echocardiogram. Echocardiography is a painless test that uses sound waves to create images of your heart. It provides your doctor with information about the size and shape of your heart and how well your heart's chambers and valves are working. This procedure takes approximately one hour. There are no restrictions for this procedure.       Do the following things EVERYDAY: Weigh yourself in the morning before breakfast. Write it down and keep it in a log. Take your medicines as prescribed Eat low salt foods--Limit salt (sodium) to 2000 mg per day.  Stay as active as you can everyday Limit all fluids for the day to less than 2 liters   At the Advanced Heart Failure Clinic, you and your health needs are our priority. As part of our continuing mission to provide you with exceptional heart care, we have created designated Provider Care Teams. These Care Teams include your primary Cardiologist (physician) and Advanced Practice Providers (APPs- Physician Assistants and Nurse Practitioners) who all work together to provide you with the care you need, when you need it.   You may see any of the following providers on your designated Care Team at your next follow up: Dr Arvilla Meres Dr Marca Ancona Dr. Marcos Eke, NP Robbie Lis, [place] [facility] [place], [place] Brynda Peon, NP Karle Plumber, PharmD   Please be sure to bring  in all your medications bottles to every appointment.    Thank you for choosing [facility]  If you have any questions or concerns before your next appointment please send [place] a message through [place] or call our office at [phone].    TO LEAVE A MESSAGE FOR THE NURSE SELECT OPTION 2, PLEASE LEAVE A MESSAGE INCLUDING: YOUR NAME DATE OF BIRTH CALL BACK NUMBER REASON FOR CALL**this is important as we prioritize the call backs  YOU WILL RECEIVE A CALL BACK THE SAME DAY AS LONG AS YOU CALL BEFORE 4:00 PM

## 2022-08-25 NOTE — Telephone Encounter (Signed)
Patient aware of lab results. Labs ordered and scheduled. She verbalized understanding about medication.

## 2022-09-08 ENCOUNTER — Ambulatory Visit (HOSPITAL_COMMUNITY)
Admission: RE | Admit: 2022-09-08 | Discharge: 2022-09-08 | Disposition: A | Discharge status: Home / Self Care | Payer: Medicaid Other | Source: Ambulatory Visit | Attending: Internal Medicine | Admitting: Internal Medicine

## 2022-09-08 DIAGNOSIS — I5022 Chronic systolic (congestive) heart failure: Secondary | ICD-10-CM | POA: Diagnosis present

## 2022-09-08 LAB — BASIC METABOLIC PANEL
Anion gap: 7 (ref 5–15)
BUN: 6 mg/dL (ref 6–20)
CO2: 23 mmol/L (ref 22–32)
Calcium: 8.9 mg/dL (ref 8.9–10.3)
Chloride: 106 mmol/L (ref 98–111)
Creatinine, Ser: 1.18 mg/dL — ABNORMAL HIGH (ref 0.44–1.00)
GFR, Estimated: >60 mL/min (ref >60)
Glucose, Bld: 86 mg/dL (ref 70–99)
Potassium: 3.8 mmol/L (ref 3.5–5.1)
Sodium: 136 mmol/L (ref 135–145)

## 2022-09-25 ENCOUNTER — Ambulatory Visit (INDEPENDENT_AMBULATORY_CARE_PROVIDER_SITE_OTHER): Payer: Medicaid Other | Admitting: Family Medicine

## 2022-09-25 ENCOUNTER — Other Ambulatory Visit (HOSPITAL_COMMUNITY)
Admission: RE | Admit: 2022-09-25 | Discharge: 2022-09-25 | Disposition: A | Discharge status: Home / Self Care | Payer: Medicaid Other | Source: Ambulatory Visit | Attending: Family Medicine | Admitting: Family Medicine

## 2022-09-25 ENCOUNTER — Encounter: Payer: Self-pay | Admitting: Family Medicine

## 2022-09-25 VITALS — BP 123/86 | HR 70 | Temp 97.9°F | Ht 67.0 in | Wt 195.6 lb

## 2022-09-25 DIAGNOSIS — Z0000 Encounter for general adult medical examination without abnormal findings: Secondary | ICD-10-CM

## 2022-09-25 DIAGNOSIS — I5020 Unspecified systolic (congestive) heart failure: Secondary | ICD-10-CM

## 2022-09-25 DIAGNOSIS — Z131 Encounter for screening for diabetes mellitus: Secondary | ICD-10-CM | POA: Diagnosis not present

## 2022-09-25 DIAGNOSIS — D509 Iron deficiency anemia, unspecified: Secondary | ICD-10-CM

## 2022-09-25 DIAGNOSIS — Z124 Encounter for screening for malignant neoplasm of cervix: Secondary | ICD-10-CM | POA: Diagnosis present

## 2022-09-25 DIAGNOSIS — Z113 Encounter for screening for infections with a predominantly sexual mode of transmission: Secondary | ICD-10-CM | POA: Diagnosis not present

## 2022-09-25 DIAGNOSIS — Z1159 Encounter for screening for other viral diseases: Secondary | ICD-10-CM

## 2022-09-25 MED ORDER — FERROUS SULFATE 325 (65 FE) MG PO TABS: 325.0000 mg | ORAL_TABLET | ORAL | 0 refills | Status: AC

## 2022-09-25 NOTE — Addendum Note (Signed)
Addended by: Maury Dus on: 09/25/2022 01:24 PM   Modules accepted: Orders

## 2022-09-25 NOTE — Progress Notes (Signed)
    SUBJECTIVE:   Chief complaint/HPI: annual examination  Ann Robbins is a 32 y.o. who presents today for an annual exam.   Review of systems form notable for: no advanced directives  Updated history tabs and problem list.   OBJECTIVE:   BP 123/86   Pulse 70   Temp 97.9 F (36.6 C)   Ht 5\' 7"  (1.702 m)   Wt 195 lb 9.6 oz (88.7 kg)   LMP 09/23/2022   SpO2 100%   BMI 30.64 kg/m   Gen: NAD, pleasant, able to participate in exam HEENT: /AT, PERRLA, nares patent bilaterally, TM normal bilaterally, oropharynx unremarkable Neck: supple, no cervical or supraclavicular lymphadenopathy, thyroid smooth and normal in size CV: RRR, normal S1/S2, no murmur Resp: Normal effort, lungs CTAB GI: abdomen soft, non-tender, non-distended GU/GYN: Exam performed in the presence of a chaperone. External genitalia within normal limits.  Vaginal mucosa pink, moist, normal rugae.  Nonfriable cervix without lesions.  Extremities: no edema or cyanosis Skin: warm and dry, no rashes noted Neuro: alert, no obvious focal deficits Psych: Normal affect and mood  ASSESSMENT/PLAN:   HFrEF (heart failure with reduced ejection fraction) (HCC) Sleep study ordered today. Otherwise continue management per cardiology  Iron deficiency anemia Start PO iron supplementation. Recheck iron studies in 3 months.   Annual Examination  See AVS for age appropriate recommendations.   PHQ score 0, reviewed and no concerns. Blood pressure reviewed and at goal.  Asked about intimate partner violence and patient reports no concerns.  The patient is s/p BTL for contraception.  Advanced directives: has blue packet at home. Trying to decide on wishes/decision maker.  Advised to return to our office at her earliest convenience.   Considered the following items based upon USPSTF recommendations: HIV testing: ordered Hepatitis C: ordered Syphilis: ordered GC/CT  ordered Lipid panel (nonfasting or fasting) discussed  based upon AHA recommendations and not ordered-- last lipid panel 06/29/22 reviewed (HDL 32, otherwise normal results) Reviewed risk factors for latent tuberculosis and not indicated  Discussed family history, BRCA testing not indicated. Cervical cancer screening: due for Pap today, cytology + HPV ordered  Follow up in 1 year or sooner if indicated.    Maury Dus, MD [facility]

## 2022-09-25 NOTE — Assessment & Plan Note (Signed)
Sleep study ordered today. Otherwise continue management per cardiology

## 2022-09-25 NOTE — Assessment & Plan Note (Signed)
Start PO iron supplementation. Recheck iron studies in 3 months.

## 2022-09-25 NOTE — Addendum Note (Signed)
Addended by: Cleatrice Burke A on: 09/25/2022 01:30 PM   Modules accepted: Orders

## 2022-09-25 NOTE — Patient Instructions (Addendum)
It was great to see you!  Things we discussed at today's visit: - We are checking some labs. We also did your pap today (cervical cancer screening). I will send you a MyChart message with the results or call if they are abnormal.  - Start taking iron supplement every other day.  I sent a prescription to your pharmacy. -I ordered a sleep study for you.  Someone should call you to schedule.  If you do not hear anything in the next 2 weeks please let me know.  Things to do to keep yourself healthy: - Exercise at least 30-45 minutes a day, 3-4 days a week.  - Eat a diet with lots of fruits and vegetables (5 servings per day). - Avoid sugar sweetened beverages and processed foods whenever possible. - Seatbelts can save your life. Wear them always.  - Safe sex - use a condom to prevent STIs. - Alcohol -  If you drink, do it moderately, less than 2 drinks per day. - Sleep - aim for 7-8 hours nightly. - Limit screen time to no more than 2 hours per day.  - Health Care Power of Attorney. Choose someone to make decisions for you if you are not able.  - Depression is common in our stressful world. If you're feeling down or losing interest in things you normally enjoy, please come in for a visit.  - Violence - If anyone is threatening or hurting you, please call immediately.   Take care and seek immediate care sooner if you develop any concerns.  Dr. [facility]

## 2022-09-26 LAB — HIV ANTIBODY (ROUTINE TESTING W REFLEX): HIV Screen 4th Generation wRfx: NONREACTIVE

## 2022-09-26 LAB — HCV AB W REFLEX TO QUANT PCR: HCV Ab: NONREACTIVE

## 2022-09-26 LAB — HEMOGLOBIN A1C
Est. average glucose Bld gHb Est-mCnc: 108 mg/dL
Hgb A1c MFr Bld: 5.4 % (ref 4.8–5.6)

## 2022-09-26 LAB — HCV INTERPRETATION

## 2022-09-26 LAB — RPR: RPR Ser Ql: NONREACTIVE

## 2022-09-28 LAB — CYTOLOGY - PAP
Chlamydia: NEGATIVE
Comment: NEGATIVE
Comment: NEGATIVE
Comment: NEGATIVE
Comment: NORMAL
Diagnosis: UNDETERMINED — AB
High risk HPV: POSITIVE — AB
Neisseria Gonorrhea: NEGATIVE
Trichomonas: NEGATIVE

## 2022-10-03 ENCOUNTER — Telehealth: Payer: Self-pay | Admitting: Family Medicine

## 2022-10-03 DIAGNOSIS — Z87410 Personal history of cervical dysplasia: Secondary | ICD-10-CM

## 2022-10-03 NOTE — Telephone Encounter (Signed)
Spoke with patient regarding pap results showing ASCUS and high risk HPV.    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 In May 2016 had ASCUS with positive HPV In June 2017 had normal Pap In July 2018 had normal Pap Now May 2024 ASCUS with positive high risk HPV  Recommendation would be colposcopy.  Previously followed by GYN but was temporarily lost to follow-up. She will re-establish at [facility].  Referral placed as well.   Maury Dus, MD PGY-3, [facility]

## 2022-10-03 NOTE — Telephone Encounter (Signed)
Attempted to call patient to discuss pap results. No answer.  Will try again later this morning.   Maury Dus, MD PGY-3, [facility]

## 2022-10-03 NOTE — Addendum Note (Signed)
Addended by: Maury Dus on: 10/03/2022 02:07 PM   Modules accepted: Orders

## 2022-11-02 ENCOUNTER — Ambulatory Visit (HOSPITAL_BASED_OUTPATIENT_CLINIC_OR_DEPARTMENT_OTHER): Payer: Medicaid Other | Attending: Family Medicine | Admitting: Internal Medicine

## 2022-11-02 DIAGNOSIS — I493 Ventricular premature depolarization: Secondary | ICD-10-CM | POA: Insufficient documentation

## 2022-11-02 DIAGNOSIS — R0683 Snoring: Secondary | ICD-10-CM | POA: Insufficient documentation

## 2022-11-02 DIAGNOSIS — I5020 Unspecified systolic (congestive) heart failure: Secondary | ICD-10-CM

## 2022-11-05 DIAGNOSIS — I5020 Unspecified systolic (congestive) heart failure: Secondary | ICD-10-CM

## 2022-11-05 NOTE — Procedures (Signed)
   Patient Name: Ann Robbins, Amrhein Date: 11/02/2022 Gender: Female D.O.B: [birth year] Age (years): 32 Referring Provider: Janit Pagan Height (inches): 67 Interpreting Physician: Jetty Duhamel MD, ABSM Weight (lbs): 189 RPSGT: Lowry Ram BMI: 30 MRN: [id number] Neck Size: 14.50  CLINICAL INFORMATION Sleep Study Type: NPSG Indication for sleep study: Hypertension, Snoring Epworth Sleepiness Score: 7  SLEEP STUDY TECHNIQUE As per the AASM Manual for the Scoring of Sleep and Associated Events v2.3 (April 2016) with a hypopnea requiring 4% desaturations.  The channels recorded and monitored were frontal, central and occipital EEG, electrooculogram (EOG), submentalis EMG (chin), nasal and oral airflow, thoracic and abdominal wall motion, anterior tibialis EMG, snore microphone, electrocardiogram, and pulse oximetry.  MEDICATIONS Medications self-administered by patient taken the night of the study : Entresto, HYDRALAZINE, ISOSORBIDE MONONITRATE, METOPROLOL SUCCINATE  SLEEP ARCHITECTURE The study was initiated at 10:34:25 PM and ended at 5:15:13 AM.  Sleep onset time was 28.9 minutes and the sleep efficiency was 82.0%. The total sleep time was 328.5 minutes.  Stage REM latency was 63.0 minutes.  The patient spent 5.6% of the night in stage N1 sleep, 67.7% in stage N2 sleep, 13.1% in stage N3 and 13.6% in REM.  Alpha intrusion was absent.  Supine sleep was 0.91%.  RESPIRATORY PARAMETERS The overall apnea/hypopnea index (AHI) was 0.2 per hour. There were 0 total apneas, including 0 obstructive, 0 central and 0 mixed apneas. There were 1 hypopneas and 3 RERAs.  The AHI during Stage REM sleep was 0.0 per hour.  AHI while supine was 0.0 per hour.  The mean oxygen saturation was 97.1%. The minimum SpO2 during sleep was 88.0%.  soft snoring was noted during this study.  CARDIAC DATA The 2 lead EKG demonstrated sinus rhythm. The mean heart rate was 71.5 beats per  minute. Other EKG findings include: PVCs.  LEG MOVEMENT DATA The total PLMS were 0 with a resulting PLMS index of 0.0. Associated arousal with leg movement index was 3.7 .  IMPRESSIONS - No significant obstructive sleep apnea occurred during this study (AHI = 0.2/h). - The patient had minimal or no oxygen desaturation during the study (Min O2 = 88.0%, Mean 97.1%) - The patient snored with soft snoring volume. - EKG findings include PVCs. - Clinically significant periodic limb movements did not occur during sleep. No significant associated arousals.  DIAGNOSIS - Normal study  RECOMMENDATIONS - Manage for symptoms based on clinical judgment. - Sleep hygiene should be reviewed to assess factors that may improve sleep quality. - Weight management and regular exercise should be initiated or continued if appropriate.  [Electronically signed] 11/05/2022 01:11 PM  Jetty Duhamel MD, ABSM Diplomate, American Board of Sleep Medicine NPI: [id number]                          Jetty Duhamel Diplomate, American Board of Sleep Medicine  ELECTRONICALLY SIGNED ON:  11/05/2022, 1:13 PM [facility] PH: [phone]   FX: [phone] ACCREDITED BY THE AMERICAN ACADEMY OF SLEEP MEDICINE

## 2022-11-22 ENCOUNTER — Ambulatory Visit (INDEPENDENT_AMBULATORY_CARE_PROVIDER_SITE_OTHER): Payer: Medicaid Other | Admitting: Obstetrics and Gynecology

## 2022-11-22 ENCOUNTER — Encounter: Payer: Self-pay | Admitting: Obstetrics and Gynecology

## 2022-11-22 ENCOUNTER — Other Ambulatory Visit (HOSPITAL_COMMUNITY)
Admission: RE | Admit: 2022-11-22 | Discharge: 2022-11-22 | Disposition: A | Discharge status: Home / Self Care | Payer: Medicaid Other | Source: Ambulatory Visit | Attending: Obstetrics and Gynecology | Admitting: Obstetrics and Gynecology

## 2022-11-22 ENCOUNTER — Other Ambulatory Visit: Payer: Self-pay

## 2022-11-22 VITALS — BP 115/69 | HR 65 | Resp 16 | Ht 67.0 in | Wt 204.5 lb

## 2022-11-22 DIAGNOSIS — R87810 Cervical high risk human papillomavirus (HPV) DNA test positive: Secondary | ICD-10-CM | POA: Diagnosis not present

## 2022-11-22 DIAGNOSIS — R87610 Atypical squamous cells of undetermined significance on cytologic smear of cervix (ASC-US): Secondary | ICD-10-CM | POA: Insufficient documentation

## 2022-11-22 NOTE — Progress Notes (Signed)
    GYNECOLOGY OFFICE COLPOSCOPY PROCEDURE NOTE  32 y.o. [id number] here for colposcopy for ASCUS with POSITIVE high risk HPV pap smear on 09/25/2022. Discussed role for HPV in cervical dysplasia, need for surveillance.  Patient gave informed written consent, time out was performed.  Placed in lithotomy position. Cervix viewed with speculum and colposcope after application of acetic acid.   Colposcopy adequate? Yes  no mosaicism, no punctation, no abnormal vasculature, and acetowhite lesion(s) noted at 2 o'clock; corresponding biopsies obtained.  ECC specimen obtained. All specimens were labeled and sent to pathology.  Chaperone was present during entire procedure.  Patient was given post procedure instructions.  Will follow up pathology and manage accordingly; patient will be contacted with results and recommendations.  Routine preventative health maintenance measures emphasized.  [name], MD, FACOG Minimally Invasive Gynecologic Surgery  Obstetrics and Gynecology, [facility] for [facility], [facility] 11/22/2022

## 2022-11-24 LAB — POCT PREGNANCY, URINE: Preg Test, Ur: NEGATIVE

## 2022-11-27 LAB — SURGICAL PATHOLOGY

## 2022-11-30 ENCOUNTER — Encounter: Payer: Self-pay | Admitting: General Practice

## 2022-12-25 ENCOUNTER — Other Ambulatory Visit (HOSPITAL_COMMUNITY): Payer: Self-pay | Admitting: Family Medicine

## 2023-01-01 ENCOUNTER — Ambulatory Visit (HOSPITAL_BASED_OUTPATIENT_CLINIC_OR_DEPARTMENT_OTHER): Payer: Medicaid Other | Admitting: Obstetrics and Gynecology

## 2023-01-01 ENCOUNTER — Other Ambulatory Visit (HOSPITAL_COMMUNITY)
Admission: RE | Admit: 2023-01-01 | Discharge: 2023-01-01 | Disposition: A | Discharge status: Home / Self Care | Payer: Medicaid Other | Source: Ambulatory Visit | Attending: Obstetrics and Gynecology | Admitting: Obstetrics and Gynecology

## 2023-01-01 ENCOUNTER — Other Ambulatory Visit: Payer: Self-pay

## 2023-01-01 ENCOUNTER — Encounter: Payer: Self-pay | Admitting: Obstetrics and Gynecology

## 2023-01-01 VITALS — BP 127/81 | HR 72 | Wt 208.4 lb

## 2023-01-01 DIAGNOSIS — N871 Moderate cervical dysplasia: Secondary | ICD-10-CM

## 2023-01-01 MED ORDER — IBUPROFEN 800 MG PO TABS
800.0000 mg | ORAL_TABLET | Freq: Once | ORAL | Status: AC
Start: 2023-01-01 — End: 2023-01-01
  Administered 2023-01-01: 800 mg via ORAL

## 2023-01-01 NOTE — Addendum Note (Signed)
Addended by: Brien Mates T on: 01/01/2023 05:13 PM   Modules accepted: Orders

## 2023-01-01 NOTE — Progress Notes (Signed)
   GYNECOLOGY OFFICE PROCEDURE NOTE  Ann Robbins is a 32 y.o. G3P3003 here for LEEP. No GYN concerns. Pap smear and colposcopy history reviewed.    Pap (09/2022) ASCUS, HR HPV pos Colpo Biopsy (11/2022) CIN 2 at 2 oclock ECC (11/2022) benign   Risks, benefits, alternatives, and limitations of procedure explained to patient, including pain, bleeding, infection, failure to remove abnormal tissue and failure to cure dysplasia, need for repeat procedures, damage to pelvic organs, cervical incompetence.  Role of HPV,cervical dysplasia and need for close followup was empasized. Informed written consent was obtained. All questions were answered. Time out performed. Prior TL as contraception.   ??Procedure: The patient was placed in lithotomy position and the bivalved coated speculum was placed in the patient's vagina. A grounding pad placed on the patient. Lugol's solution was applied to the cervix and areas of decreased uptake were noted around the transformation zone.   Local anesthesia was administered via an intracervical block using 10 ml of 2% Lidocaine with epinephrine. The suction was turned on and the Medium Fisher Cone Biopsy Excisor on 60 Watts of blended current was used to excise the area of decreased uptake and excise the entire transformation zone. Excellent hemostasis was achieved using roller ball coagulation set at 50 Watts coagulation current. Monsel's solution was then applied and the speculum was removed from the vagina. Specimens were sent to pathology.  ?The patient tolerated the procedure well. Post-operative instructions given to patient, including instruction to seek medical attention for persistent bright red bleeding, fever, abdominal/pelvic pain, dysuria, nausea or vomiting. She was also told about the possibility of having copious yellow to black tinged discharge for weeks. She was counseled to avoid anything in the vagina (sex/douching/tampons) for 3 weeks. She has a 4 week  post-operative check to assess wound healing, review results and discuss further management.   [name], MD, FACOG Minimally Invasive Gynecologic Surgery  Obstetrics and Gynecology, [facility] for [facility], [facility] 01/01/2023

## 2023-01-31 ENCOUNTER — Ambulatory Visit (INDEPENDENT_AMBULATORY_CARE_PROVIDER_SITE_OTHER): Payer: Medicaid Other | Admitting: Obstetrics and Gynecology

## 2023-01-31 ENCOUNTER — Other Ambulatory Visit: Payer: Self-pay

## 2023-01-31 VITALS — BP 117/72 | HR 79 | Wt 212.5 lb

## 2023-01-31 DIAGNOSIS — Z09 Encounter for follow-up examination after completed treatment for conditions other than malignant neoplasm: Secondary | ICD-10-CM

## 2023-01-31 DIAGNOSIS — N871 Moderate cervical dysplasia: Secondary | ICD-10-CM

## 2023-01-31 NOTE — Progress Notes (Signed)
   POSTOPERATIVE VISIT NOTE   Subjective:     Ann Robbins is a 32 y.o. G3P3003 who presents to the clinic 4 weeks status post  LEEP  for  cervical dysplasia .  Incision: n/a Vaginal bleeding: none No abnormal discharge Resumed sexual acitivity: not yet   The following portions of the patient's history were reviewed and updated as appropriate: allergies, current medications, past family history, past medical history, past social history, past surgical history, and problem list..   Review of Systems Pertinent items are noted in HPI.    Objective:    BP 117/72   Pulse 79   Wt 212 lb 8 oz (96.4 kg)   LMP 01/10/2023 (Exact Date) Comment: Lasts 7 days  BMI 33.28 kg/m  General:  alert, cooperative, and no distress  Pelvic:    Normal appearing vulva, well healed cervix with small volume discharge    Pathology Results: FINAL MICROSCOPIC DIAGNOSIS:   A. 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       Endocervical margin is focally positive for HSIL / CIN 2.       Exocervical margin is negative for HSIL.       Negative for invasion.     Assessment:   Doing well postoperatively. Operative findings again reviewed. Pathology report discussed.   Plan:    1. Postop check Doing well and reviewed pathology   2. Dysplasia of cervix, high grade CIN 2 Negative margins, plan for repeat pap at 6 months postop     [name], MD Obstetrician & Gynecologist, [facility] for [employer], [facility]

## 2023-05-01 NOTE — Progress Notes (Signed)
Advanced Heart Failure Clinic Note   PCP: Celine Mans, MD Primary Cardiologist: Dr. Mayford Knife  AHF Cardiologist: Dr. Gala Romney   Chief complaint: Follow up for Systolic Heart Failure and Hypertension   HPI: 32 y.o.woman with previous history of untreated HTN (out of meds x 1 year), admitted 2/24 w/ new acute systolic heart failure and hypertensive urgency. Echo EF 20-25%. RV mod reduced, severe MR. Renal artery doppler study and hyperaldo work up were both negative. She was diuresed w/ IV Lasix and started on GDMT.Cardiac MRI showed severe biventricular dysfunction (LVEF 22%, RVEF 28%), moderate MR, no extensive gadolinium enhancement. Etiology for HF felt most likely to be from uncontrolled HTN. Cardiac cath was not perused given young age, lack of ischemic symptoms and other risk factors. She was also found to have iron defiencey anemia and was treated w/ IV Fe. Discharged home, wt 188 lb.   Today she returns for AHF follow up. Overall feeling good. Denies palpitations, CP, dizziness, edema, or PND/Orthopnea. No SOB. Appetite ok. No fever or chills. Weight at home 230 pounds. Taking all medications. Denies ETOH and smoking.  Has been out of Jardiance since September. 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 She has 3 children (5, 9, 14 year-olds).   Cardiac Studies - Echo today, official read pending. Had Dr. Elwyn Lade review and EF 55-60%.  - Echo (2/24): EF 20-25%, RV moderately reduced, severe MR - cMRI (2/24): LVEF 22%, RVEF 28%, LGE in RV insertion site suggestive of elevated pulmonary pressures, moderate MR (regurgitant fraction 35%), moderate pericardial effusions  Past Medical History:  Diagnosis Date   HFrEF (heart failure with reduced ejection fraction) (HCC)    Hypertension 2015   First with PIH and bp remained high after delivery   Current Outpatient Medications  Medication Sig Dispense Refill   acetaminophen (TYLENOL) 325 MG tablet Take 2 tablets (650 mg  total) by mouth every 6 (six) hours as needed for moderate pain.     ferrous sulfate 325 (65 FE) MG tablet Take 1 tablet (325 mg total) by mouth every other day. 45 tablet 0   furosemide (LASIX) 40 MG tablet Take 1 tablet (40 mg total) by mouth as needed. 30 tablet 11   hydrALAZINE (APRESOLINE) 25 MG tablet Take 1.5 tablets (37.5 mg total) by mouth 3 (three) times daily. 135 tablet 8   isosorbide mononitrate (IMDUR) 30 MG 24 hr tablet Take 1 tablet (30 mg total) by mouth daily. 30 tablet 11   metoprolol succinate (TOPROL-XL) 50 MG 24 hr tablet Take 1 tablet (50 mg total) by mouth 2 (two) times daily. Take with or immediately following a meal. 60 tablet 11   potassium chloride SA (KLOR-CON M) 20 MEQ tablet Take 1 tablet (20 mEq total) by mouth daily. Please call office to schedule an appointment 30 tablet 3   sacubitril-valsartan (ENTRESTO) 97-103 MG Take 1 tablet by mouth 2 (two) times daily. 180 tablet 3   spironolactone (ALDACTONE) 25 MG tablet Take 1 tablet (25 mg total) by mouth daily. 30 tablet 11   empagliflozin (JARDIANCE) 10 MG TABS tablet Take 1 tablet (10 mg total) by mouth daily. (Patient not taking: Reported on 05/15/2023) 30 tablet 0   No current facility-administered medications for this encounter.   No Known Allergies  Social History   Socioeconomic History   Marital status: Single    Spouse name: Nicanor Alcon   Number of children: 2   Years of education: college-some   Highest  education level: Not on file  Occupational History   Occupation: [employer]  Tobacco Use   Smoking status: Never   Smokeless tobacco: Never  Vaping Use   Vaping status: Never Used  Substance and Sexual Activity   Alcohol use: No   Drug use: No   Sexual activity: Yes    Birth control/protection: Surgical  Other Topics Concern   Not on file  Social History Narrative   Originally from [place] in March of 2017   Lives at home with boyfriend, Jena Gauss and  their 2 children   Social Drivers of Health   Financial Resource Strain: Low Risk  (06/30/2022)   Overall Financial Resource Strain (CARDIA)    Difficulty of Paying Living Expenses: Not hard at all  Food Insecurity: Food Insecurity Present (01/31/2023)   Hunger Vital Sign    Worried About Running Out of Food in the Last Year: Sometimes true    Ran Out of Food in the Last Year: Sometimes true  Transportation Needs: No Transportation Needs (01/31/2023)   PRAPARE - Administrator, Civil Service (Medical): No    Lack of Transportation (Non-Medical): No  Physical Activity: Not on file  Stress: Not on file  Social Connections: Not on file  Intimate Partner Violence: Not At Risk (06/29/2022)   Humiliation, Afraid, Rape, and Kick questionnaire    Fear of Current or Ex-Partner: No    Emotionally Abused: No    Physically Abused: No    Sexually Abused: No   Family History  Problem Relation Age of Onset   Heart failure Mother    Hypertension Mother    Wt Readings from Last 3 Encounters:  05/15/23 104.9 kg (231 lb 3.2 oz)  01/31/23 96.4 kg (212 lb 8 oz)  01/01/23 94.5 kg (208 lb 6.4 oz)   BP 122/88   Pulse 84   Ht 5\' 7"  (1.702 m)   Wt 104.9 kg (231 lb 3.2 oz)   SpO2 100%   BMI 36.21 kg/m   PHYSICAL EXAM: General:  well appearing.  No respiratory difficulty. Walked into clinic.  HEENT: normal Neck: supple. JVD flat. Carotids 2+ bilat; no bruits. No lymphadenopathy or thyromegaly appreciated. Cor: PMI nondisplaced. Regular rate & rhythm. No rubs, gallops or murmurs. Lungs: clear Abdomen: soft, nontender, nondistended. No hepatosplenomegaly. No bruits or masses. Good bowel sounds. Extremities: no cyanosis, clubbing, rash, edema  Neuro: alert & oriented x 3, cranial nerves grossly intact. moves all 4 extremities w/o difficulty. Affect pleasant.   ASSESSMENT & PLAN: 1. Chronic Systolic Heart Failure - New diagnosis, Echo 2/14 EF 20-25%. RV mod reduced. Severe MR - Etiology  uncertain but suspect most likely hypertensive CM. LHC deferred given young age, minimal risk factors and lack of ischemic symptoms. cMRI showed severe biventricular dysfunction (LVEF 22%, RVEF 28%), moderate MR, no extensive gadolinium enhancement. No signs of infiltrative cardiomyopathy.  - Echo today official read pending. Dr Elwyn Lade reviewed in clinic and EF back up to 55-60%.  - NYHA I. Volume status stable.  - Continue hydralazine 37.5 mg tid.  - Continue Imdur 30 mg daily. - Restart Jardiance 10 mg daily. BMET/BNP today. Repeat BMET 1 week.  - Continue Entresto 97-103 mg bid - Continue Spironolactone 25 mg daily  - Continue Toprol XL 50 mg bid - She has PRN Lasix at home. - Advised avoidance of further pregnancies. She has had tubal ligation    2. HTN - H/o Gestational hypertension with her  first child 14 years ago. Remained hypertensive with 2 subsequent pregnancies.  - Stopped HTN medications ~ 1 year ago. Admitted 2/24 w/ hypertensive emergency and HF. Renal artery doppler study and hyperaldo work up were both negative.  - BP improving w/ GDMT - Sleep study complete, no sleep apnea  3.  ID Anemia  - suspect 2/2 menses  - Iron Studies 2/24: Fe 18, Ferritin 20, Tsat 5 >>treated w/ IV Fe  - Followed by PCP , now on oral FE  Her EF is back to normal today and she is on good GDMT. Discussed with Dr. Gala Romney, she can graduate from AHF clinic today! We will send referral to general cardiology to take over care.    Alen Bleacher, NP 05/15/23

## 2023-05-15 ENCOUNTER — Ambulatory Visit (HOSPITAL_BASED_OUTPATIENT_CLINIC_OR_DEPARTMENT_OTHER)
Admission: RE | Admit: 2023-05-15 | Discharge: 2023-05-15 | Disposition: A | Discharge status: Home / Self Care | Payer: Self-pay | Source: Ambulatory Visit | Attending: *Deleted | Admitting: *Deleted

## 2023-05-15 ENCOUNTER — Encounter (HOSPITAL_COMMUNITY): Payer: Self-pay

## 2023-05-15 ENCOUNTER — Ambulatory Visit (HOSPITAL_COMMUNITY)
Admission: RE | Admit: 2023-05-15 | Discharge: 2023-05-15 | Disposition: A | Discharge status: Home / Self Care | Payer: Self-pay | Source: Ambulatory Visit | Attending: Family Medicine | Admitting: Family Medicine

## 2023-05-15 VITALS — BP 122/88 | HR 84 | Ht 67.0 in | Wt 231.2 lb

## 2023-05-15 DIAGNOSIS — D509 Iron deficiency anemia, unspecified: Secondary | ICD-10-CM

## 2023-05-15 DIAGNOSIS — I5022 Chronic systolic (congestive) heart failure: Secondary | ICD-10-CM

## 2023-05-15 DIAGNOSIS — I10 Essential (primary) hypertension: Secondary | ICD-10-CM

## 2023-05-15 DIAGNOSIS — I110 Hypertensive heart disease with heart failure: Secondary | ICD-10-CM | POA: Insufficient documentation

## 2023-05-15 LAB — BASIC METABOLIC PANEL
Anion gap: 8 (ref 5–15)
BUN: 10 mg/dL (ref 6–20)
CO2: 24 mmol/L (ref 22–32)
Calcium: 9.4 mg/dL (ref 8.9–10.3)
Chloride: 107 mmol/L (ref 98–111)
Creatinine, Ser: 1.08 mg/dL — ABNORMAL HIGH (ref 0.44–1.00)
GFR, Estimated: >60 mL/min (ref >60)
Glucose, Bld: 87 mg/dL (ref 70–99)
Potassium: 3.9 mmol/L (ref 3.5–5.1)
Sodium: 139 mmol/L (ref 135–145)

## 2023-05-15 LAB — ECHOCARDIOGRAM COMPLETE
AR max vel: 2.49 cm2
AV Area VTI: 2.63 cm2
AV Area mean vel: 2.33 cm2
AV Mean grad: 5 mm[Hg]
AV Peak grad: 9.9 mm[Hg]
Ao pk vel: 1.57 m/s
Area-P 1/2: 4.06 cm2
Calc EF: 63.2 %
MV M vel: 5.15 m/s
MV Peak grad: 106.1 mm[Hg]
MV VTI: 2.55 cm2
Radius: 0.47 cm
S' Lateral: 3.6 cm
Single Plane A2C EF: 65 %
Single Plane A4C EF: 60.8 %

## 2023-05-15 LAB — BRAIN NATRIURETIC PEPTIDE: B Natriuretic Peptide: 7.9 pg/mL (ref 0.0–100.0)

## 2023-05-15 MED ORDER — EMPAGLIFLOZIN 10 MG PO TABS: 10.0000 mg | ORAL_TABLET | Freq: Every day | ORAL | 2 refills | Status: DC

## 2023-05-15 NOTE — Patient Instructions (Addendum)
Good to see you today!  Congratulations you have graduated from the clinic  You have been referred to  cardiology   Restart your Ann Robbins   Labs done today, your results will be available in MyChart, we will contact you for abnormal readings.  Repeat lab work  as scheduled   If you have any questions or concerns before your next appointment please send [place] a message through [place] or call our office at [phone].    TO LEAVE A MESSAGE FOR THE NURSE SELECT OPTION 2, PLEASE LEAVE A MESSAGE INCLUDING: YOUR NAME DATE OF BIRTH CALL BACK NUMBER REASON FOR CALL**this is important as we prioritize the call backs  YOU WILL RECEIVE A CALL BACK THE SAME DAY AS LONG AS YOU CALL BEFORE 4:00 PM  At the Advanced Heart Failure Clinic, you and your health needs are our priority. As part of our continuing mission to provide you with exceptional heart care, we have created designated Provider Care Teams. These Care Teams include your primary Cardiologist (physician) and Advanced Practice Providers (APPs- Physician Assistants and Nurse Practitioners) who all work together to provide you with the care you need, when you need it.   You may see any of the following providers on your designated Care Team at your next follow up: Dr Arvilla Meres Dr Marca Ancona Dr. Dorthula Nettles Dr. Clearnce Hasten Amy Filbert Schilder, NP Robbie Lis, [place] [facility] [place], [place] Brynda Peon, NP [place] Lee, NP Karle Plumber, PharmD   Please be sure to bring in all your medications bottles to every appointment.    Thank you for choosing [facility]

## 2023-05-24 ENCOUNTER — Telehealth (HOSPITAL_COMMUNITY): Payer: Self-pay

## 2023-05-24 ENCOUNTER — Other Ambulatory Visit (HOSPITAL_COMMUNITY): Payer: Self-pay

## 2023-05-24 ENCOUNTER — Encounter (HOSPITAL_COMMUNITY): Payer: Self-pay

## 2023-05-24 NOTE — Telephone Encounter (Signed)
 Advanced Heart Failure Patient Advocate Encounter  Prior authorization for Jardiance  has been submitted and approved. Test billing returns $29.99 for 90 day supply. This patient is eligible to use the Jardiance  copay savings card.  KeyBETHA COOP Effective: 05/24/2023 to 05/21/2098  Unable to confirm with pharmacy at this time (closed for lunch) informed pt of approval and savings card by phone.  Rachel DEL, CPhT Rx Patient Advocate Phone: [phone]

## 2023-05-25 ENCOUNTER — Ambulatory Visit (HOSPITAL_COMMUNITY)
Admission: RE | Admit: 2023-05-25 | Discharge: 2023-05-25 | Disposition: A | Discharge status: Home / Self Care | Payer: Self-pay | Source: Ambulatory Visit | Attending: Cardiology | Admitting: Cardiology

## 2023-05-25 DIAGNOSIS — I5022 Chronic systolic (congestive) heart failure: Secondary | ICD-10-CM | POA: Insufficient documentation

## 2023-05-25 LAB — BASIC METABOLIC PANEL
Anion gap: 8 (ref 5–15)
BUN: 7 mg/dL (ref 6–20)
CO2: 23 mmol/L (ref 22–32)
Calcium: 8.8 mg/dL — ABNORMAL LOW (ref 8.9–10.3)
Chloride: 107 mmol/L (ref 98–111)
Creatinine, Ser: 1.12 mg/dL — ABNORMAL HIGH (ref 0.44–1.00)
GFR, Estimated: >60 mL/min (ref >60)
Glucose, Bld: 93 mg/dL (ref 70–99)
Potassium: 3.6 mmol/L (ref 3.5–5.1)
Sodium: 138 mmol/L (ref 135–145)

## 2023-08-01 ENCOUNTER — Other Ambulatory Visit: Payer: Self-pay

## 2023-08-07 ENCOUNTER — Other Ambulatory Visit: Payer: Self-pay

## 2023-08-09 ENCOUNTER — Other Ambulatory Visit (HOSPITAL_COMMUNITY): Payer: Self-pay | Admitting: Family Medicine

## 2023-08-27 ENCOUNTER — Ambulatory Visit: Payer: Medicaid Other | Attending: Cardiovascular Disease | Admitting: Cardiovascular Disease

## 2023-08-27 ENCOUNTER — Other Ambulatory Visit (HOSPITAL_COMMUNITY): Payer: Self-pay | Admitting: Internal Medicine

## 2023-08-27 ENCOUNTER — Encounter: Payer: Self-pay | Admitting: Family Medicine

## 2023-08-27 ENCOUNTER — Telehealth: Payer: Self-pay

## 2023-08-27 ENCOUNTER — Other Ambulatory Visit (HOSPITAL_COMMUNITY): Payer: Self-pay | Admitting: Family Medicine

## 2023-08-27 ENCOUNTER — Encounter: Payer: Self-pay | Admitting: Cardiovascular Disease

## 2023-08-27 VITALS — BP 122/92 | HR 85 | Ht 67.0 in | Wt 236.0 lb

## 2023-08-27 DIAGNOSIS — I5022 Chronic systolic (congestive) heart failure: Secondary | ICD-10-CM | POA: Diagnosis not present

## 2023-08-27 DIAGNOSIS — I5020 Unspecified systolic (congestive) heart failure: Secondary | ICD-10-CM

## 2023-08-27 DIAGNOSIS — I10 Essential (primary) hypertension: Secondary | ICD-10-CM

## 2023-08-27 MED ORDER — METOPROLOL SUCCINATE ER 50 MG PO TB24: 50.0000 mg | ORAL_TABLET | Freq: Two times a day (BID) | ORAL | 11 refills | Status: AC

## 2023-08-27 MED ORDER — ENTRESTO 97-103 MG PO TABS: 1.0000 | ORAL_TABLET | Freq: Two times a day (BID) | ORAL | 3 refills | Status: DC

## 2023-08-27 MED ORDER — ISOSORBIDE MONONITRATE ER 30 MG PO TB24: 30.0000 mg | ORAL_TABLET | Freq: Every day | ORAL | 11 refills | Status: DC

## 2023-08-27 MED ORDER — FUROSEMIDE 40 MG PO TABS: 40.0000 mg | ORAL_TABLET | ORAL | 11 refills | Status: AC | PRN

## 2023-08-27 MED ORDER — POTASSIUM CHLORIDE CRYS ER 20 MEQ PO TBCR
20.0000 meq | EXTENDED_RELEASE_TABLET | Freq: Every day | ORAL | 3 refills | Status: DC
Start: 2023-08-27 — End: 2023-12-17

## 2023-08-27 MED ORDER — EMPAGLIFLOZIN 10 MG PO TABS: 10.0000 mg | ORAL_TABLET | Freq: Every day | ORAL | 3 refills | Status: DC

## 2023-08-27 MED ORDER — HYDRALAZINE HCL 25 MG PO TABS: ORAL_TABLET | ORAL | 11 refills | Status: DC

## 2023-08-27 MED ORDER — SPIRONOLACTONE 25 MG PO TABS: 25.0000 mg | ORAL_TABLET | Freq: Every day | ORAL | 11 refills | Status: AC

## 2023-08-27 NOTE — Assessment & Plan Note (Signed)
 History of essential hypertension with blood pressure measured today at 122/92.  She is on hydralazine, metoprolol, isosorbide, Entresto and Aldactone.

## 2023-08-27 NOTE — Addendum Note (Signed)
 Addended by: Bernita Buffy on: 08/27/2023 04:50 PM   Modules accepted: Orders

## 2023-08-27 NOTE — Telephone Encounter (Signed)
 While pt in the office for visit she drops off pt assist provider portion. Advised pt to fill out pt portion and bring back to our office and we will submit application all together on her behalf. Pt plans to drop off pt portion of application and W2 on Wednesday. Provider portion completed and faxed to patient assistance team.

## 2023-08-27 NOTE — Patient Instructions (Signed)
 Medication Instructions:  Your physician recommends that you continue on your current medications as directed. Please refer to the Current Medication list given to you today.  *If you need a refill on your cardiac medications before your next appointment, please call your pharmacy*  Follow-Up: At [facility], you and your health needs are our priority.  As part of our continuing mission to provide you with exceptional heart care, our providers are all part of one team.  This team includes your primary Cardiologist (physician) and Advanced Practice Providers or APPs (Physician Assistants and Nurse Practitioners) who all work together to provide you with the care you need, when you need it.  Your next appointment:   6 month(s)  Provider:   Marjie Skiff, PA-C, Robet Leu, PA-C, Azalee Course, PA-C, Bernadene Person, NP, or Reather Littler, NP       Then, Nanetta Batty, MD will plan to see you again in 12 month(s).   We recommend signing up for the patient portal called "MyChart".  Sign up information is provided on this After Visit Summary.  MyChart is used to connect with patients for Virtual Visits (Telemedicine).  Patients are able to view lab/test results, encounter notes, upcoming appointments, etc.  Non-urgent messages can be sent to your provider as well.   To learn more about what you can do with MyChart, go to [email or web address].   Other Instructions       1st Floor: - Lobby - Registration  - Pharmacy  - Lab - Cafe  2nd Floor: - PV Lab - Diagnostic Testing (echo, CT, nuclear med)  3rd Floor: - Vacant  4th Floor: - TCTS (cardiothoracic surgery) - AFib Clinic - Structural Heart Clinic - Vascular Surgery  - Vascular Ultrasound  5th Floor: - HeartCare Cardiology (general and EP) - Clinical Pharmacy for coumadin, hypertension, lipid, weight-loss medications, and med management appointments    Valet parking services will be available as well.

## 2023-08-27 NOTE — Assessment & Plan Note (Signed)
 History of heart failure with reduced EF and EF in the 20 to 25% range.  After being on GDMT her most recent echo performed 05/11/2023 revealed normalization of her EF with normal diastolic parameters and only mild MR compared to severe MR in the past.  She is asymptomatic.  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

## 2023-08-27 NOTE — Progress Notes (Signed)
 08/27/2023 Ann Robbins   May 10, 1991  [id number]  Primary Physician Celine Mans, MD Primary Cardiologist: Runell Gess MD Nicholes Calamity, [place]  HPI:  Ann Robbins is a 33 y.o. severely overweight single African-American female mother of 3 children who works as a Runner, broadcasting/film/video at [employer].  She is transitioning to me from the advanced heart failure clinic because of normalization of her EF.  She was previously a patient of Dr. Norris Cross.  Her risk factors include treated hypertension.  There is no family history of heart disease.  She is never had a heart attack or stroke.  She currently denies chest pain or shortness of breath.  She exercises at work.  She was being seen by the advanced heart failure clinic for severe biventricular dysfunction with severe MR.  After being on GDMT a follow-up echo performed 05/15/2023 revealed normalization of her LV function with only mild MR.   Current Meds  Medication Sig   acetaminophen (TYLENOL) 325 MG tablet Take 2 tablets (650 mg total) by mouth every 6 (six) hours as needed for moderate pain.   empagliflozin (JARDIANCE) 10 MG TABS tablet Take 1 tablet (10 mg total) by mouth daily.   ferrous sulfate 325 (65 FE) MG tablet Take 1 tablet (325 mg total) by mouth every other day.   furosemide (LASIX) 40 MG tablet Take 1 tablet (40 mg total) by mouth as needed.   hydrALAZINE (APRESOLINE) 25 MG tablet TAKE 1 & 1/2 (ONE & ONE-HALF) TABLETS BY MOUTH THREE TIMES DAILY   isosorbide mononitrate (IMDUR) 30 MG 24 hr tablet Take 1 tablet (30 mg total) by mouth daily.   metoprolol succinate (TOPROL-XL) 50 MG 24 hr tablet Take 1 tablet (50 mg total) by mouth 2 (two) times daily. Take with or immediately following a meal.   potassium chloride SA (KLOR-CON M) 20 MEQ tablet Take 1 tablet (20 mEq total) by mouth daily. Please call office to schedule an appointment   sacubitril-valsartan (ENTRESTO) 97-103 MG Take 1 tablet by mouth 2 (two) times daily.    spironolactone (ALDACTONE) 25 MG tablet Take 1 tablet (25 mg total) by mouth daily.     No Known Allergies  Social History   Socioeconomic History   Marital status: Single    Spouse name: Nicanor Alcon   Number of children: 2   Years of education: college-some   Highest education level: Not on file  Occupational History   Occupation: [employer]  Tobacco Use   Smoking status: Never   Smokeless tobacco: Never  Vaping Use   Vaping status: Never Used  Substance and Sexual Activity   Alcohol use: No   Drug use: No   Sexual activity: Yes    Birth control/protection: Surgical  Other Topics Concern   Not on file  Social History Narrative   Originally from [place] in March of 2017   Lives at home with boyfriend, Jena Gauss and their 2 children   Social Drivers of Health   Financial Resource Strain: Low Risk  (06/30/2022)   Overall Financial Resource Strain (CARDIA)    Difficulty of Paying Living Expenses: Not hard at all  Food Insecurity: Food Insecurity Present (01/31/2023)   Hunger Vital Sign    Worried About Running Out of Food in the Last Year: Sometimes true    Ran Out of Food in the Last Year: Sometimes true  Transportation Needs: No Transportation Needs (01/31/2023)   PRAPARE - Transportation  Lack of Transportation (Medical): No    Lack of Transportation (Non-Medical): No  Physical Activity: Not on file  Stress: Not on file  Social Connections: Not on file  Intimate Partner Violence: Not At Risk (06/29/2022)   Humiliation, Afraid, Rape, and Kick questionnaire    Fear of Current or Ex-Partner: No    Emotionally Abused: No    Physically Abused: No    Sexually Abused: No     Review of Systems: General: negative for chills, fever, night sweats or weight changes.  Cardiovascular: negative for chest pain, dyspnea on exertion, edema, orthopnea, palpitations, paroxysmal nocturnal dyspnea or shortness of breath Dermatological: negative for  rash Respiratory: negative for cough or wheezing Urologic: negative for hematuria Abdominal: negative for nausea, vomiting, diarrhea, bright red blood per rectum, melena, or hematemesis Neurologic: negative for visual changes, syncope, or dizziness All other systems reviewed and are otherwise negative except as noted above.    Blood pressure (!) 122/92, pulse 85, height 5\' 7"  (1.702 m), weight 236 lb (107 kg), SpO2 98%.  General appearance: alert and no distress Neck: no adenopathy, no carotid bruit, no JVD, supple, symmetrical, trachea midline, and thyroid not enlarged, symmetric, no tenderness/mass/nodules Lungs: clear to auscultation bilaterally Heart: regular rate and rhythm, S1, S2 normal, no murmur, click, rub or gallop Extremities: extremities normal, atraumatic, no cyanosis or edema Pulses: 2+ and symmetric Skin: Skin color, texture, turgor normal. No rashes or lesions Neurologic: Grossly normal  EKG EKG Interpretation Date/Time:  Monday August 27 2023 15:14:31 EDT Ventricular Rate:  78 PR Interval:  180 QRS Duration:  76 QT Interval:  376 QTC Calculation: 428 R Axis:   39  Text Interpretation: Normal sinus rhythm Normal ECG When compared with ECG of 29-Jun-2022 14:58, PREVIOUS ECG IS PRESENT Confirmed by Nanetta Batty [phone]) on 08/27/2023 3:20:44 PM    ASSESSMENT AND PLAN:   Essential hypertension History of essential hypertension with blood pressure measured today at 122/92.  She is on hydralazine, metoprolol, isosorbide, Entresto and Aldactone.  HFrEF (heart failure with reduced ejection fraction) (HCC) History of heart failure with reduced EF and EF in the 20 to 25% range.  After being on GDMT her most recent echo performed 05/11/2023 revealed normalization of her EF with normal diastolic parameters and only mild MR compared to severe MR in the past.  She is asymptomatic.  The etiology of her presumed nonischemic cardiomyopathy was most likely  hypertensive.     Runell Gess MD FACP,FACC,FAHA, [facility] 08/27/2023 3:28 PM

## 2023-08-28 ENCOUNTER — Telehealth: Payer: Self-pay | Admitting: Pharmacy Technician

## 2023-08-28 NOTE — Telephone Encounter (Signed)
 Patient has patient portion of PAP-pending  Provider portion is in media until we receive the PAP back from patient

## 2023-09-20 NOTE — Telephone Encounter (Signed)
 Reached out for second attempt at application

## 2023-09-21 ENCOUNTER — Other Ambulatory Visit (HOSPITAL_COMMUNITY): Payer: Self-pay

## 2023-09-21 ENCOUNTER — Encounter: Payer: Self-pay | Admitting: Pharmacy Technician

## 2023-09-21 ENCOUNTER — Telehealth: Payer: Self-pay | Admitting: Pharmacy Technician

## 2023-09-21 NOTE — Telephone Encounter (Signed)
 Spoke to patient and went the coupon route instead of assistance. closing

## 2023-09-21 NOTE — Telephone Encounter (Signed)
   Patient has Nurse, learning disability and the copay was 90.00 for 90 days and 45.00 for 30 days. I got her these coupons. She was happy with those prices. I gave [facility] the coupon information and called the patient back to make her aware it is processed. Sent her a message with the coupon information as well.

## 2023-09-27 ENCOUNTER — Ambulatory Visit: Admitting: Obstetrics and Gynecology

## 2023-09-27 ENCOUNTER — Other Ambulatory Visit: Payer: Self-pay

## 2023-09-27 ENCOUNTER — Other Ambulatory Visit (HOSPITAL_COMMUNITY)
Admission: RE | Admit: 2023-09-27 | Discharge: 2023-09-27 | Disposition: A | Discharge status: Home / Self Care | Source: Ambulatory Visit | Attending: Obstetrics and Gynecology | Admitting: Obstetrics and Gynecology

## 2023-09-27 ENCOUNTER — Encounter: Payer: Self-pay | Admitting: Obstetrics and Gynecology

## 2023-09-27 VITALS — BP 136/91 | HR 77 | Wt 237.0 lb

## 2023-09-27 DIAGNOSIS — N871 Moderate cervical dysplasia: Secondary | ICD-10-CM

## 2023-09-27 NOTE — Progress Notes (Signed)
    GYNECOLOGY VISIT  Patient name: Ann Robbins MRN [id number]  Date of birth: 03-Mar-1991 Chief Complaint:   Gynecologic Exam  History:  Ann Robbins is a 33 y.o. G3P3003 being seen today for pap.  No issues or concerns.   Past Medical History:  Diagnosis Date   HFrEF (heart failure with reduced ejection fraction) (HCC)    Hypertension 2015   First with PIH and bp remained high after delivery    Past Surgical History:  Procedure Laterality Date   CHOLECYSTECTOMY  02/2015   Laparoscopic   TUBAL LIGATION Bilateral 05/21/2017   Procedure: POST PARTUM TUBAL LIGATION;  Surgeon: Tresia Fruit, MD;  Location: [facility];  Service: Gynecology;  Laterality: Bilateral;    The following portions of the patient's history were reviewed and updated as appropriate: allergies, current medications, past family history, past medical history, past social history, past surgical history and problem list.   Health Maintenance:   Last pap     Component Value Date/Time   DIAGPAP (A) 09/25/2022 1330    - Atypical squamous cells of undetermined significance (ASC-US )   HPVHIGH Positive (A) 09/25/2022 1330   ADEQPAP  09/25/2022 1330    Satisfactory but limited for evaluation with partially obscuring blood;   ADEQPAP no transformation zone component present. 09/25/2022 1330   Colpo (11/2022) CIN 2 LEEP (12/2022) CIN 2, endocervical margin focally positive for CIN2, exocervical margin is negative for HSIL, negative invasion  Last mammogram: n/a   Review of Systems:  Pertinent items are noted in HPI. Comprehensive review of systems was otherwise negative.   Objective:  Physical Exam BP (!) 136/91   Pulse 77   Wt 237 lb (107.5 kg)   LMP 09/16/2023   BMI 37.12 kg/m    Physical Exam Vitals and nursing note reviewed. Exam conducted with a chaperone present.  Constitutional:      Appearance: Normal appearance.  HENT:     Head: Normocephalic and atraumatic.  Pulmonary:     Effort: Pulmonary  effort is normal.  Genitourinary:    General: Normal vulva.     Vagina: Normal.     Cervix: Normal. No discharge, friability or erythema.  Skin:    General: Skin is warm and dry.  Neurological:     General: No focal deficit present.     Mental Status: She is alert.  Psychiatric:        Mood and Affect: Mood normal.        Behavior: Behavior normal.        Thought Content: Thought content normal.        Judgment: Judgment normal.       Assessment & Plan:   1. Dysplasia of cervix, high grade CIN 2 (Primary) Pap collected as part of post LEEP follow up.  - Cytology - PAP  Routine preventative health maintenance measures emphasized.  Kiki Pelton, MD Minimally Invasive Gynecologic [facility], [facility]

## 2023-10-03 LAB — CYTOLOGY - PAP
Comment: NEGATIVE
Comment: NEGATIVE
Comment: NEGATIVE
HPV 16: NEGATIVE
HPV 18 / 45: NEGATIVE
High risk HPV: POSITIVE — AB

## 2023-10-04 ENCOUNTER — Ambulatory Visit: Payer: Self-pay | Admitting: Obstetrics and Gynecology

## 2023-10-17 ENCOUNTER — Telehealth: Payer: Self-pay | Admitting: Family Medicine

## 2023-10-17 NOTE — Telephone Encounter (Signed)
 Called patient and left voice message to call our office back to get scheduled for a Colpo.

## 2023-11-29 ENCOUNTER — Other Ambulatory Visit (HOSPITAL_COMMUNITY)
Admission: RE | Admit: 2023-11-29 | Discharge: 2023-11-29 | Disposition: A | Discharge status: Home / Self Care | Source: Ambulatory Visit | Attending: Obstetrics and Gynecology | Admitting: Obstetrics and Gynecology

## 2023-11-29 ENCOUNTER — Other Ambulatory Visit: Payer: Self-pay

## 2023-11-29 ENCOUNTER — Encounter: Payer: Self-pay | Admitting: Obstetrics and Gynecology

## 2023-11-29 ENCOUNTER — Ambulatory Visit: Admitting: Obstetrics and Gynecology

## 2023-11-29 VITALS — BP 138/88 | HR 81 | Wt 240.7 lb

## 2023-11-29 DIAGNOSIS — R87612 Low grade squamous intraepithelial lesion on cytologic smear of cervix (LGSIL): Secondary | ICD-10-CM | POA: Diagnosis present

## 2023-11-29 DIAGNOSIS — N871 Moderate cervical dysplasia: Secondary | ICD-10-CM | POA: Diagnosis not present

## 2023-11-29 DIAGNOSIS — Z1331 Encounter for screening for depression: Secondary | ICD-10-CM

## 2023-11-29 NOTE — Addendum Note (Signed)
 Addended by: Lourdes Manning O on: 11/29/2023 04:33 PM   Modules accepted: Orders

## 2023-11-29 NOTE — Progress Notes (Signed)
    GYNECOLOGY OFFICE COLPOSCOPY PROCEDURE NOTE  33 y.o. H6E6996 here for colposcopy for low-grade squamous intraepithelial neoplasia (LGSIL - encompassing HPV,mild dysplasia,CIN I) pap smear on 09/2023. Discussed role for HPV in cervical dysplasia, need for surveillance.  Patient gave informed written consent, time out was performed.  Placed in lithotomy position. Cervix viewed with speculum and colposcope after application of acetic acid.   Colposcopy adequate? Yes  acetowhite lesion(s) noted at 5 o'clock; corresponding biopsies obtained.  ECC specimen obtained. All specimens were labeled and sent to pathology.  Chaperone was present during entire procedure.  Patient was given post procedure instructions.  Will follow up pathology and manage accordingly; patient will be contacted with results and recommendations.  Routine preventative health maintenance measures emphasized.   Carter Quarry, MD, FACOG Minimally Invasive Gynecologic Surgery  Obstetrics and Gynecology, [facility] for [facility], [facility] 11/29/2023

## 2023-12-04 LAB — SURGICAL PATHOLOGY

## 2023-12-05 ENCOUNTER — Ambulatory Visit: Payer: Self-pay | Admitting: Obstetrics and Gynecology

## 2023-12-06 NOTE — Telephone Encounter (Addendum)
-----   Message from Jennings sent at 12/05/2023  6:55 PM EDT ----- biopsy insufficient and ECC shows some changes but very small specimen. Can either repeat colpo or repeat pap in 1 year ----- Message ----- From: Interface, Lab In [facility] Seven Sent: 12/04/2023   8:54 AM EDT To: Carter Quarry, MD  7/17  [phone]  Per chart review, pt has read message from Dr. Quarry regarding biopsy results. Pt was called to discuss her choice for follow up and she did not answer. Message left stating that I will send Mychart message and she was requested to respond.

## 2023-12-15 ENCOUNTER — Other Ambulatory Visit: Payer: Self-pay | Admitting: Cardiovascular Disease

## 2024-01-28 ENCOUNTER — Other Ambulatory Visit: Payer: Self-pay

## 2024-01-28 ENCOUNTER — Telehealth (HOSPITAL_COMMUNITY): Payer: Self-pay | Admitting: Internal Medicine

## 2024-01-28 DIAGNOSIS — I5022 Chronic systolic (congestive) heart failure: Secondary | ICD-10-CM

## 2024-01-28 NOTE — Telephone Encounter (Signed)
-----   Message from Cathern Age H sent at 04/09/2023 11:14 AM EST ----- Patient was supposed to have 2 month echo and appt with bensimhon after April appt with us  but never called back to schedule  She is interested in getting scheduled now- asks that someone call her this afternoon after 2:30 cause that's when she gets off work  Thank you!  -Jenna

## 2024-01-30 MED ORDER — SACUBITRIL-VALSARTAN 97-103 MG PO TABS: 1.0000 | ORAL_TABLET | Freq: Two times a day (BID) | ORAL | 2 refills | Status: DC

## 2024-03-27 ENCOUNTER — Other Ambulatory Visit (HOSPITAL_COMMUNITY)
Admission: RE | Admit: 2024-03-27 | Discharge: 2024-03-27 | Disposition: A | Discharge status: Home / Self Care | Payer: Self-pay | Source: Ambulatory Visit

## 2024-03-27 ENCOUNTER — Ambulatory Visit (INDEPENDENT_AMBULATORY_CARE_PROVIDER_SITE_OTHER): Payer: Self-pay | Admitting: Family Medicine

## 2024-03-27 ENCOUNTER — Ambulatory Visit

## 2024-03-27 VITALS — BP 132/87 | HR 83 | Ht 67.0 in | Wt 238.8 lb

## 2024-03-27 DIAGNOSIS — Z113 Encounter for screening for infections with a predominantly sexual mode of transmission: Secondary | ICD-10-CM | POA: Insufficient documentation

## 2024-03-27 DIAGNOSIS — Z0000 Encounter for general adult medical examination without abnormal findings: Secondary | ICD-10-CM

## 2024-03-27 NOTE — Patient Instructions (Signed)
 It was great to see you today Please come back for your TB test I will let you know if any of your STD screen was abnormal

## 2024-03-27 NOTE — Progress Notes (Signed)
    SUBJECTIVE:   Chief compliant/HPI: annual examination  Ann Robbins is a 33 y.o. who presents today for an annual exam.   Review of systems form notable for none.   Updated history tabs and problem list.   OBJECTIVE:   BP 132/87   Pulse 83   Ht 5' 7 (1.702 m)   Wt 238 lb 12.8 oz (108.3 kg)   LMP 03/18/2024   SpO2 98%   BMI 37.40 kg/m   General: well appearing, NAD HEENT: Bilateral Tms clear. No nasal congestion. Oropharynx clear. No cervical lymphadenopathy Cardiovascular: RRR, no m/r/g Respiratory: normal work of breathing on RA, CTAB Abdomen: Normal bowel sounds, soft, non-tender Extremities: No swelling BLE  ASSESSMENT/PLAN:   Assessment & Plan Encounter for annual physical exam F/u 1 year Screening examination for STD (sexually transmitted disease) GC chlamydia trichomonas HIV RPR testing completed. Will f/u results. Pt is asymptomatic.  Annual Examination  See AVS for age appropriate recommendations.   PHQ score 0, reviewed and discussed. Blood pressure reviewed and at goal.  Asked about intimate partner violence and patient reports none.  The patient currently uses BTL for contraception. Folate recommended as appropriate, minimum of 400 mcg per day.  Advanced directives n/a   Considered the following items based upon USPSTF recommendations: HIV testing:ordered Hepatitis C: recently completed and result reviewed, normal  Hepatitis B:not completed Syphilis: ordered GC/CT ordered Lipid panel (nonfasting or fasting) discussed based upon AHA recommendations and recently completed and repeat not yet indicated.  Consider repeat every 4-6 years.  Reviewed risk factors for latent tuberculosis and not indicated.  Discussed family history, BRCA testing not indicated.  Cervical cancer screening: UTD, follows with OBGYN Immunizations UTD  MyChart Activation:Already signed up   Follow up in 1  year or sooner if indicated.    Elyce Prescott, DO [facility]

## 2024-03-28 ENCOUNTER — Ambulatory Visit (INDEPENDENT_AMBULATORY_CARE_PROVIDER_SITE_OTHER): Payer: Self-pay

## 2024-03-28 DIAGNOSIS — Z111 Encounter for screening for respiratory tuberculosis: Secondary | ICD-10-CM

## 2024-03-28 LAB — CERVICOVAGINAL ANCILLARY ONLY
Chlamydia: NEGATIVE
Comment: NEGATIVE
Comment: NEGATIVE
Comment: NORMAL
Neisseria Gonorrhea: NEGATIVE
Trichomonas: NEGATIVE

## 2024-03-28 NOTE — Progress Notes (Signed)
 Patient is here for a PPD placement.  PPD placed in left forearm @ 2:05 pm.  Patient will return 03/31/2024 to have PPD read.   Patient also has employment TB screening form that was provided during visit. Will keep form in RN room in purple folder for when patient returns to clinic on Monday PM.   Chiquita JAYSON English, RN

## 2024-03-29 LAB — RPR, QUANT+TP ABS (REFLEX)
Rapid Plasma Reagin, Quant: 1:1 {titer} — ABNORMAL HIGH
T Pallidum Abs: NONREACTIVE

## 2024-03-29 LAB — HIV ANTIBODY (ROUTINE TESTING W REFLEX): HIV Screen 4th Generation wRfx: NONREACTIVE

## 2024-03-29 LAB — RPR: RPR Ser Ql: REACTIVE — AB

## 2024-03-31 ENCOUNTER — Ambulatory Visit: Payer: Self-pay

## 2024-03-31 DIAGNOSIS — Z111 Encounter for screening for respiratory tuberculosis: Secondary | ICD-10-CM

## 2024-03-31 LAB — TB SKIN TEST
Induration: 0 mm
TB Skin Test: NEGATIVE

## 2024-03-31 NOTE — Progress Notes (Signed)
 PPD Reading Note PPD read and results entered in EpicCare. Result: 0 mm induration. Interpretation: Negative Allergic reaction: No  Paperwork for TB screening completed and given to patient.

## 2024-04-01 ENCOUNTER — Ambulatory Visit: Payer: Self-pay | Admitting: Family Medicine

## 2024-04-03 ENCOUNTER — Other Ambulatory Visit (HOSPITAL_COMMUNITY)

## 2024-04-03 ENCOUNTER — Ambulatory Visit (HOSPITAL_COMMUNITY)
Admission: RE | Admit: 2024-04-03 | Discharge: 2024-04-03 | Disposition: A | Discharge status: Home / Self Care | Payer: Self-pay | Source: Ambulatory Visit | Attending: Internal Medicine | Admitting: Internal Medicine

## 2024-04-03 ENCOUNTER — Other Ambulatory Visit (HOSPITAL_COMMUNITY): Payer: Self-pay

## 2024-04-03 VITALS — BP 134/96 | HR 85 | Wt 235.2 lb

## 2024-04-03 DIAGNOSIS — Z79899 Other long term (current) drug therapy: Secondary | ICD-10-CM | POA: Insufficient documentation

## 2024-04-03 DIAGNOSIS — Z5971 Insufficient health insurance coverage: Secondary | ICD-10-CM | POA: Insufficient documentation

## 2024-04-03 DIAGNOSIS — I110 Hypertensive heart disease with heart failure: Secondary | ICD-10-CM | POA: Insufficient documentation

## 2024-04-03 DIAGNOSIS — I10 Essential (primary) hypertension: Secondary | ICD-10-CM

## 2024-04-03 DIAGNOSIS — Z6836 Body mass index (BMI) 36.0-36.9, adult: Secondary | ICD-10-CM | POA: Insufficient documentation

## 2024-04-03 DIAGNOSIS — I5022 Chronic systolic (congestive) heart failure: Secondary | ICD-10-CM | POA: Insufficient documentation

## 2024-04-03 DIAGNOSIS — I5020 Unspecified systolic (congestive) heart failure: Secondary | ICD-10-CM

## 2024-04-03 DIAGNOSIS — Z7984 Long term (current) use of oral hypoglycemic drugs: Secondary | ICD-10-CM | POA: Insufficient documentation

## 2024-04-03 MED ORDER — HYDRALAZINE HCL 50 MG PO TABS
50.0000 mg | ORAL_TABLET | Freq: Three times a day (TID) | ORAL | 5 refills | Status: AC
  Filled 2024-04-03 (×2): qty 90, 30d supply, fill #0
  Filled 2024-05-11: qty 90, 30d supply, fill #1
  Filled 2024-06-19: qty 90, 30d supply, fill #2

## 2024-04-03 MED ORDER — HYDRALAZINE HCL 50 MG PO TABS: 50.0000 mg | ORAL_TABLET | Freq: Three times a day (TID) | ORAL | 5 refills | Status: DC

## 2024-04-03 MED ORDER — EMPAGLIFLOZIN 10 MG PO TABS: 10.0000 mg | ORAL_TABLET | Freq: Every day | ORAL | 3 refills | Status: DC

## 2024-04-03 NOTE — Addendum Note (Signed)
 Encounter addended by: Cathern Andriette DEL, LCSW on: 04/03/2024 4:22 PM  Actions taken: Clinical Note Signed

## 2024-04-03 NOTE — Progress Notes (Signed)
 Medication Samples have been provided to the patient.  Drug name: JARDIANCE        Strength: 10MG          Qty: 5 BOXES  LOT: [id number]  Exp.Date: 08/19/2025  Dosing instructions: AS DIRECTED   The patient has been instructed regarding the correct time, dose, and frequency of taking this medication, including desired effects and most common side effects.   Dian Minahan B Selisa Tensley 3:49 PM 04/03/2024

## 2024-04-03 NOTE — Progress Notes (Signed)
 Advanced Heart Failure Clinic Note   PCP: Alba Sharper, MD Primary Cardiologist: Dr. Shlomo  AHF Cardiologist: Dr. Cherrie   Chief complaint: Follow up for Systolic Heart Failure and Hypertension   HPI: 33 y.o.woman with previous history of untreated HTN (out of meds x 1 year), admitted 2/24 w/ new acute systolic heart failure and hypertensive urgency. Echo EF 20-25%. RV mod reduced, severe MR. Renal artery doppler study and hyperaldo work up were both negative. She was diuresed w/ IV Lasix  and started on GDMT.Cardiac MRI showed severe biventricular dysfunction (LVEF 22%, RVEF 28%), moderate MR, no extensive gadolinium enhancement. Etiology for HF felt most likely to be from uncontrolled HTN. Cardiac cath was not perused given young age, lack of ischemic symptoms and other risk factors. She was also found to have iron defiencey anemia and was treated w/ IV Fe. Discharged home, wt 188 lb.   Today she returns for AHF follow up. Feels great. Denies SOB or edema. Has been out of Jardiance . Otherwise complaint with meds SBP typically ~ 130.   Cardiac Studies - Echo 12/24, EF 60-65%. RV normal Mild MR  - Echo (2/24): EF 20-25%, RV moderately reduced, severe MR - cMRI (2/24): LVEF 22%, RVEF 28%, LGE in RV insertion site suggestive of elevated pulmonary pressures, moderate MR (regurgitant fraction 35%), moderate pericardial effusions  Past Medical History:  Diagnosis Date   HFrEF (heart failure with reduced ejection fraction) (HCC)    Hypertension 2015   First with PIH and bp remained high after delivery   Current Outpatient Medications  Medication Sig Dispense Refill   acetaminophen  (TYLENOL ) 325 MG tablet Take 2 tablets (650 mg total) by mouth every 6 (six) hours as needed for moderate pain.     empagliflozin  (JARDIANCE ) 10 MG TABS tablet Take 1 tablet (10 mg total) by mouth daily. 90 tablet 3   ferrous sulfate  325 (65 FE) MG tablet Take 1 tablet (325 mg total) by mouth every other  day. 45 tablet 0   furosemide  (LASIX ) 40 MG tablet Take 1 tablet (40 mg total) by mouth as needed. 30 tablet 11   hydrALAZINE  (APRESOLINE ) 25 MG tablet Take 1 & 1/2 (one & one-half) tablets by mouth three times daily. 135 tablet 11   isosorbide  mononitrate (IMDUR ) 30 MG 24 hr tablet Take 1 tablet (30 mg total) by mouth daily. 30 tablet 11   metoprolol  succinate (TOPROL -XL) 50 MG 24 hr tablet Take 1 tablet (50 mg total) by mouth 2 (two) times daily. Take with or immediately following a meal. 60 tablet 11   potassium chloride  SA (KLOR-CON  M) 20 MEQ tablet Take 1 tablet by mouth once daily 90 tablet 2   sacubitril -valsartan  (ENTRESTO ) 97-103 MG Take 1 tablet by mouth 2 (two) times daily. 180 tablet 2   spironolactone  (ALDACTONE ) 25 MG tablet Take 1 tablet (25 mg total) by mouth daily. 30 tablet 11   No current facility-administered medications for this encounter.   No Known Allergies  Social History   Socioeconomic History   Marital status: Single    Spouse name: Hugh McAdoo   Number of children: 2   Years of education: college-some   Highest education level: Not on file  Occupational History   Occupation: [employer]  Tobacco Use   Smoking status: Never   Smokeless tobacco: Never  Vaping Use   Vaping status: Never Used  Substance and Sexual Activity   Alcohol use: No   Drug use: No   Sexual activity: Yes  Birth control/protection: Surgical  Other Topics Concern   Not on file  Social History Narrative   Originally from [place] in March of [place]   Lives at home with boyfriend, Prentice and their 2 children   Social Drivers of Health   Financial Resource Strain: Low Risk  (06/30/2022)   Overall Financial Resource Strain (CARDIA)    Difficulty of Paying Living Expenses: Not hard at all  Food Insecurity: No Food Insecurity (11/29/2023)   Hunger Vital Sign    Worried About Running Out of Food in the Last Year: Never true    Ran Out of Food in  the Last Year: Never true  Transportation Needs: No Transportation Needs (11/29/2023)   PRAPARE - Administrator, Civil Service (Medical): No    Lack of Transportation (Non-Medical): No  Physical Activity: Not on file  Stress: Not on file  Social Connections: Not on file  Intimate Partner Violence: Not At Risk (06/29/2022)   Humiliation, Afraid, Rape, and Kick questionnaire    Fear of Current or Ex-Partner: No    Emotionally Abused: No    Physically Abused: No    Sexually Abused: No   Family History  Problem Relation Age of Onset   Heart failure Mother    Hypertension Mother    Wt Readings from Last 3 Encounters:  04/03/24 106.7 kg (235 lb 3.2 oz)  03/27/24 108.3 kg (238 lb 12.8 oz)  11/29/23 109.2 kg (240 lb 11.2 oz)   BP (!) 134/96   Pulse 85   Wt 106.7 kg (235 lb 3.2 oz)   LMP 03/18/2024   SpO2 99%   BMI 36.84 kg/m   PHYSICAL EXAM: General:  Sitting up in bed. No resp difficulty HEENT: normal Neck: supple. no JVD.  Cor: Regular rate & rhythm. No rubs, gallops or murmurs. Lungs: clear Abdomen: obese soft, nontender, nondistended.Good bowel sounds. Extremities: no cyanosis, clubbing, rash, edema Neuro: alert & orientedx3, cranial nerves grossly intact. moves all 4 extremities w/o difficulty. Affect pleasant  ASSESSMENT & PLAN: 1. Chronic Systolic Heart Failure - New diagnosis, Echo 2/14 EF 20-25%. RV mod reduced. Severe MR - Etiology uncertain but suspect most likely hypertensive CM. LHC deferred given young age, minimal risk factors and lack of ischemic symptoms. cMRI showed severe biventricular dysfunction (LVEF 22%, RVEF 28%), moderate MR, no extensive gadolinium enhancement. No signs of infiltrative cardiomyopathy.  - Echo 12/24 EF 60-65% - NYHA I. Volume ok On lasix  PRN - Increase hydralazine  to 50 mg tid.  - Continue Imdur  30 mg daily. - Refill Jardiance  10 mg daily.  - Continue Entresto  97-103 mg bid - Continue Spironolactone  25 mg daily  -  Continue Toprol  XL 50 mg bid - Advised avoidance of further pregnancies. She has had tubal ligation  - Needs repeat echo to confirm EF stability - Can graduate HF CLinic and follow with [facility]    2. HTN - H/o Gestational hypertension with her first child 14 years ago. Remained hypertensive with 2 subsequent pregnancies.  - Stopped HTN medications ~ 1 year ago. Admitted 2/24 w/ hypertensive emergency and HF. Renal artery doppler study and hyperaldo work up were both negative.  - BP improved w/ GDMT. Increase hydral as above  - Sleep study complete, no sleep apnea  3. Morbid obesity - Body mass index is 36.84 kg/m. - trying to lose weight with diet and exercise - doesn't have insurance to afford  4. SDOH - lost her health insurance -  will have her meet with Pharmd to review meds   Toribio Fuel, MD 04/03/24

## 2024-04-03 NOTE — Patient Instructions (Addendum)
 Medication Changes:  INCREASE HYDRALAZINE  TO 50MG  THREE TIMES DAILY   RESTART JARDIANCE  10MG  ONCE DAILY---SAMPLES GIVEN TODAY-- PLEASE LET US  KNOW IF ADDITIONAL SAMPLES ARE NEEDED BEFORE INSURANCE IS ACTIVE  PHARMACY PATIENT [facility] WILL CALL YOU REGARDING PATIENT ASSISTANCE   Testing/Procedures:  ECHOCARDIOGRAM AS SCHEDULED   Follow-Up in: WITH GENERAL CARDIOLOGY--AT HEARTCARE-- YOU HAVE GRADUATED THE HF CLINIC   At the Advanced Heart Failure Clinic, you and your health needs are our priority. We have a designated team specialized in the treatment of Heart Failure. This Care Team includes your primary Heart Failure Specialized Cardiologist (physician), Advanced Practice Providers (APPs- Physician Assistants and Nurse Practitioners), and Pharmacist who all work together to provide you with the care you need, when you need it.   You may see any of the following providers on your designated Care Team at your next follow up:  Dr. Toribio Fuel Dr. Ezra Shuck Dr. Odis Brownie Greig Mosses, NP Caffie Shed, [place] [facility] [place], [place] Beckey Coe, NP Jordan Lee, NP Tinnie Redman, PharmD   Please be sure to bring in all your medications bottles to every appointment.   Need to Contact Us :  If you have any questions or concerns before your next appointment please send us  a message through [facility] or call our office at [phone].    TO LEAVE A MESSAGE FOR THE NURSE SELECT OPTION 2, PLEASE LEAVE A MESSAGE INCLUDING: YOUR NAME DATE OF BIRTH CALL BACK NUMBER REASON FOR CALL**this is important as we prioritize the call backs  YOU WILL RECEIVE A CALL BACK THE SAME DAY AS LONG AS YOU CALL BEFORE 4:00 PM

## 2024-04-03 NOTE — Addendum Note (Signed)
 Encounter addended by: Tita Andriette NOVAK, RN on: 04/03/2024 4:00 PM  Actions taken: Pend clinical note, Visit diagnoses modified, Diagnosis association updated, Clinical Note Signed, Pharmacy for encounter modified, Order list changed

## 2024-04-03 NOTE — Progress Notes (Signed)
 H&V Care Navigation CSW Progress Note  Clinical Social Worker consulted to speak with pt regarding lack of insurance.  Pt stopped work in Sept and starting a new job next week.  Will have insurance through her new job but will be a few months till she can get it.  Pt reports income of 1,400/month for household of 4 so should qualify for Medicaid- provided information about [facility] to apply.  Pt reports no other needs or concerns- CSW will continue to follow and assist as needed  Nyomie Ehrlich H. Jaryan [place], LCSW Clinical Social Worker [facility] Desk#: [phone] Cell#: [phone]

## 2024-04-03 NOTE — Addendum Note (Signed)
 Encounter addended by: Kang Ishida B, RN on: 04/03/2024 4:04 PM  Actions taken: Clinical Note Signed

## 2024-04-07 ENCOUNTER — Other Ambulatory Visit (HOSPITAL_COMMUNITY): Payer: Self-pay

## 2024-04-07 DIAGNOSIS — I5022 Chronic systolic (congestive) heart failure: Secondary | ICD-10-CM

## 2024-05-12 ENCOUNTER — Other Ambulatory Visit (HOSPITAL_COMMUNITY): Payer: Self-pay

## 2024-06-02 ENCOUNTER — Encounter: Payer: Self-pay | Admitting: Cardiovascular Disease

## 2024-06-02 DIAGNOSIS — I5022 Chronic systolic (congestive) heart failure: Secondary | ICD-10-CM

## 2024-06-17 ENCOUNTER — Other Ambulatory Visit: Payer: Self-pay

## 2024-06-20 ENCOUNTER — Other Ambulatory Visit (HOSPITAL_COMMUNITY): Payer: Self-pay

## 2024-06-20 MED ORDER — ISOSORBIDE MONONITRATE ER 30 MG PO TB24: 30.0000 mg | ORAL_TABLET | Freq: Every day | ORAL | 11 refills | Status: AC

## 2024-06-20 MED ORDER — SACUBITRIL-VALSARTAN 97-103 MG PO TABS: 1.0000 | ORAL_TABLET | Freq: Two times a day (BID) | ORAL | 3 refills | Status: AC

## 2024-06-20 MED ORDER — EMPAGLIFLOZIN 10 MG PO TABS: 10.0000 mg | ORAL_TABLET | Freq: Every day | ORAL | 3 refills | Status: AC

## 2024-07-04 ENCOUNTER — Ambulatory Visit (HOSPITAL_COMMUNITY): Payer: Self-pay
